# Patient Record
Sex: Male | Born: 1950 | Race: Black or African American | Hispanic: No | Marital: Married | Smoking: Never smoker
Health system: Southern US, Community
[De-identification: ages and names within clinical notes are randomized; demographics above are authoritative.]

## PROBLEM LIST (undated history)

## (undated) ENCOUNTER — Ambulatory Visit: Admission: EM | Payer: Medicare HMO | Source: Home / Self Care

## (undated) DIAGNOSIS — R32 Unspecified urinary incontinence: Secondary | ICD-10-CM

## (undated) DIAGNOSIS — T7840XA Allergy, unspecified, initial encounter: Secondary | ICD-10-CM

## (undated) DIAGNOSIS — B269 Mumps without complication: Secondary | ICD-10-CM

## (undated) DIAGNOSIS — Z87442 Personal history of urinary calculi: Secondary | ICD-10-CM

## (undated) DIAGNOSIS — E559 Vitamin D deficiency, unspecified: Secondary | ICD-10-CM

## (undated) DIAGNOSIS — C61 Malignant neoplasm of prostate: Secondary | ICD-10-CM

## (undated) DIAGNOSIS — R319 Hematuria, unspecified: Secondary | ICD-10-CM

## (undated) DIAGNOSIS — E7800 Pure hypercholesterolemia, unspecified: Secondary | ICD-10-CM

## (undated) DIAGNOSIS — R7309 Other abnormal glucose: Secondary | ICD-10-CM

## (undated) DIAGNOSIS — I10 Essential (primary) hypertension: Secondary | ICD-10-CM

## (undated) DIAGNOSIS — R9431 Abnormal electrocardiogram [ECG] [EKG]: Secondary | ICD-10-CM

## (undated) DIAGNOSIS — B059 Measles without complication: Secondary | ICD-10-CM

## (undated) DIAGNOSIS — Z8546 Personal history of malignant neoplasm of prostate: Secondary | ICD-10-CM

## (undated) DIAGNOSIS — B019 Varicella without complication: Secondary | ICD-10-CM

## (undated) DIAGNOSIS — E538 Deficiency of other specified B group vitamins: Secondary | ICD-10-CM

## (undated) DIAGNOSIS — G4733 Obstructive sleep apnea (adult) (pediatric): Secondary | ICD-10-CM

## (undated) DIAGNOSIS — N529 Male erectile dysfunction, unspecified: Secondary | ICD-10-CM

## (undated) DIAGNOSIS — J309 Allergic rhinitis, unspecified: Secondary | ICD-10-CM

## (undated) HISTORY — DX: Personal history of malignant neoplasm of prostate: Z85.46

## (undated) HISTORY — DX: Abnormal electrocardiogram (ECG) (EKG): R94.31

## (undated) HISTORY — DX: Varicella without complication: B01.9

## (undated) HISTORY — DX: Vitamin D deficiency, unspecified: E55.9

## (undated) HISTORY — DX: Allergy, unspecified, initial encounter: T78.40XA

## (undated) HISTORY — DX: Other abnormal glucose: R73.09

## (undated) HISTORY — DX: Unspecified urinary incontinence: R32

## (undated) HISTORY — DX: Obstructive sleep apnea (adult) (pediatric): G47.33

## (undated) HISTORY — DX: Measles without complication: B05.9

## (undated) HISTORY — DX: Allergic rhinitis, unspecified: J30.9

## (undated) HISTORY — DX: Essential (primary) hypertension: I10

## (undated) HISTORY — DX: Hematuria, unspecified: R31.9

## (undated) HISTORY — DX: Deficiency of other specified B group vitamins: E53.8

## (undated) HISTORY — DX: Male erectile dysfunction, unspecified: N52.9

## (undated) HISTORY — PX: ELBOW SURGERY: SHX618

## (undated) HISTORY — DX: Mumps without complication: B26.9

## (undated) HISTORY — DX: Personal history of urinary calculi: Z87.442

## (undated) HISTORY — DX: Pure hypercholesterolemia, unspecified: E78.00

---

## 2003-08-25 DIAGNOSIS — C61 Malignant neoplasm of prostate: Secondary | ICD-10-CM

## 2003-08-25 HISTORY — PX: PROSTATECTOMY: SHX69

## 2003-08-25 HISTORY — DX: Malignant neoplasm of prostate: C61

## 2004-03-01 ENCOUNTER — Other Ambulatory Visit: Payer: Self-pay

## 2006-08-24 LAB — HM COLONOSCOPY: HM Colonoscopy: NORMAL

## 2009-07-24 HISTORY — PX: OTHER SURGICAL HISTORY: SHX169

## 2009-12-09 ENCOUNTER — Emergency Department: Payer: Self-pay | Admitting: Emergency Medicine

## 2010-07-23 ENCOUNTER — Ambulatory Visit: Payer: Self-pay | Admitting: Family Medicine

## 2012-07-13 ENCOUNTER — Ambulatory Visit: Payer: Self-pay | Admitting: Family Medicine

## 2012-07-13 ENCOUNTER — Other Ambulatory Visit: Payer: Self-pay | Admitting: Family Medicine

## 2012-07-13 NOTE — Telephone Encounter (Signed)
No paper chart °

## 2012-07-14 ENCOUNTER — Encounter: Payer: Self-pay | Admitting: *Deleted

## 2012-07-18 ENCOUNTER — Other Ambulatory Visit: Payer: Self-pay | Admitting: Family Medicine

## 2012-07-18 NOTE — Telephone Encounter (Signed)
Please pull paper chart.  

## 2012-07-19 NOTE — Telephone Encounter (Signed)
No paper chart °

## 2012-07-26 DIAGNOSIS — N393 Stress incontinence (female) (male): Secondary | ICD-10-CM | POA: Insufficient documentation

## 2012-08-02 ENCOUNTER — Ambulatory Visit (INDEPENDENT_AMBULATORY_CARE_PROVIDER_SITE_OTHER): Payer: 59 | Admitting: Family Medicine

## 2012-08-02 ENCOUNTER — Encounter: Payer: Self-pay | Admitting: Family Medicine

## 2012-08-02 VITALS — BP 162/88 | HR 71 | Temp 98.3°F | Resp 16 | Ht 70.0 in | Wt 196.0 lb

## 2012-08-02 DIAGNOSIS — I10 Essential (primary) hypertension: Secondary | ICD-10-CM | POA: Insufficient documentation

## 2012-08-02 DIAGNOSIS — Z0000 Encounter for general adult medical examination without abnormal findings: Secondary | ICD-10-CM

## 2012-08-02 LAB — HEMOGLOBIN A1C: Mean Plasma Glucose: 120 mg/dL — ABNORMAL HIGH (ref <117)

## 2012-08-02 LAB — COMPREHENSIVE METABOLIC PANEL
Alkaline Phosphatase: 72 U/L (ref 39–117)
BUN: 14 mg/dL (ref 6–23)
Glucose, Bld: 92 mg/dL (ref 70–99)
Total Bilirubin: 0.6 mg/dL (ref 0.3–1.2)

## 2012-08-02 LAB — CBC WITH DIFFERENTIAL/PLATELET
Basophils Relative: 1 % (ref 0–1)
Eosinophils Absolute: 0 10*3/uL (ref 0.0–0.7)
HCT: 40.5 % (ref 39.0–52.0)
Hemoglobin: 14.3 g/dL (ref 13.0–17.0)
MCH: 29.7 pg (ref 26.0–34.0)
MCHC: 35.3 g/dL (ref 30.0–36.0)
Monocytes Absolute: 0.3 10*3/uL (ref 0.1–1.0)
Monocytes Relative: 5 % (ref 3–12)
Neutrophils Relative %: 78 % — ABNORMAL HIGH (ref 43–77)

## 2012-08-02 LAB — LIPID PANEL
Cholesterol: 117 mg/dL (ref 0–200)
HDL: 40 mg/dL (ref >39)
Total CHOL/HDL Ratio: 2.9 Ratio
Triglycerides: 45 mg/dL (ref <150)

## 2012-08-02 LAB — POCT UA - MICROSCOPIC ONLY
Bacteria, U Microscopic: NEGATIVE
Crystals, Ur, HPF, POC: NEGATIVE

## 2012-08-02 LAB — POCT URINALYSIS DIPSTICK
Bilirubin, UA: NEGATIVE
Glucose, UA: NEGATIVE
Leukocytes, UA: NEGATIVE
Nitrite, UA: NEGATIVE
Urobilinogen, UA: 0.2
pH, UA: 6

## 2012-08-02 MED ORDER — CLONIDINE HCL 0.1 MG PO TABS: 0.1000 mg | ORAL_TABLET | Freq: Two times a day (BID) | ORAL | Status: DC

## 2012-08-02 MED ORDER — DILTIAZEM HCL ER COATED BEADS 180 MG PO CP24: 180.0000 mg | ORAL_CAPSULE | Freq: Two times a day (BID) | ORAL | Status: DC

## 2012-08-02 MED ORDER — ENALAPRIL MALEATE 20 MG PO TABS: 20.0000 mg | ORAL_TABLET | Freq: Two times a day (BID) | ORAL | Status: DC

## 2012-08-02 NOTE — Assessment & Plan Note (Signed)
Anticipatory guidance --- weight maintenance, exercise.  Colonoscopy UTD; immunizations UTD.  Obtain labs.

## 2012-08-02 NOTE — Patient Instructions (Addendum)
1. Routine general medical examination at a health care facility  POCT urinalysis dipstick, CBC with Differential, Comprehensive metabolic panel, Hemoglobin A1c, Lipid panel, TSH, Vitamin B12, Vitamin D 25 hydroxy, EKG 12-Lead, POCT UA - Microscopic Only, PSA     INCREASE CLONIDINE 0.1MG  TO ONE PILL TWICE DAILY.

## 2012-08-02 NOTE — Assessment & Plan Note (Signed)
Uncontrolled/worsening; increase Clonidine to 0.1mg  bid.  Obtain labs.

## 2012-08-02 NOTE — Progress Notes (Signed)
[address]   [place], [place]  [address]   [phone]  Subjective:    Patient ID: Juan Chavez, male    DOB: [birth year], 61 y.o.   MRN: [id number]  HPIThis 61 y.o. male presents to establish care and for CPE.  Last physical 04/28/11.    Colonoscopy 2008.  Normal.  [facility]. TDAP 04/28/2011 Pneumovax 2000. Zostavax never; shingles torso 1997. Influenza vaccine 04/2012 employer.  2.  HTN: elevated in past several months; home readings 130-160/70-90.  Major stressors with health, work, wife's health.     Review of Systems  Constitutional: Negative for fever, chills, diaphoresis, activity change, appetite change, fatigue and unexpected weight change.  HENT: Negative for hearing loss, ear pain, nosebleeds, congestion, sore throat, facial swelling, rhinorrhea, sneezing, drooling, mouth sores, trouble swallowing, neck pain, neck stiffness, dental problem, voice change, postnasal drip, sinus pressure, tinnitus and ear discharge.   Eyes: Negative for photophobia, pain, discharge, redness, itching and visual disturbance.  Respiratory: Negative for apnea, cough, choking, chest tightness, shortness of breath, wheezing and stridor.   Cardiovascular: Negative for chest pain, palpitations and leg swelling.  Gastrointestinal: Negative for nausea, vomiting, abdominal pain, diarrhea, constipation, blood in stool, abdominal distention, anal bleeding and rectal pain.  Genitourinary: Negative for dysuria, urgency, hematuria, flank pain, decreased urine volume, discharge, penile swelling, scrotal swelling, enuresis, difficulty urinating, genital sores, penile pain and testicular pain.       Worsening urinary incontinence.  Wears 1-3 Depends daily.  Musculoskeletal: Negative for myalgias, back pain, joint swelling, arthralgias and gait problem.  Skin: Negative for color change, pallor, rash and wound.  Neurological: Negative for dizziness, tremors, seizures, syncope, facial asymmetry, speech difficulty, weakness,  light-headedness, numbness and headaches.  Hematological: Negative for adenopathy. Does not bruise/bleed easily.  Psychiatric/Behavioral: Negative for suicidal ideas, hallucinations, behavioral problems, confusion, sleep disturbance, self-injury, dysphoric mood, decreased concentration and agitation. The patient is not nervous/anxious and is not hyperactive.         Past Medical History  Diagnosis Date  . Personal history of urinary calculi   . Hematuria, unspecified   . Other abnormal glucose   . Unspecified vitamin D deficiency   . Other B-complex deficiencies   . Personal history of malignant neoplasm of prostate   . Pure hypercholesterolemia   . Allergic rhinitis, cause unspecified   . Essential hypertension, benign   . Nonspecific abnormal electrocardiogram (ECG) (EKG)   . Impotence of organic origin   . Obstructive sleep apnea (adult) (pediatric)     CPAP  . Unspecified urinary incontinence   . Chicken pox     childhood  . Measles     childhood  . Mumps     childhood    Past Surgical History  Procedure Date  . Elbow surgery   . Prostatectomy 2005  . Urological surgery 07/2009    Prior to Admission medications   Medication Sig Start Date End Date Taking? Authorizing Provider  aspirin 81 MG tablet Take 81 mg by mouth daily.   Yes Historical Provider, MD  cholecalciferol (VITAMIN D) 1000 UNITS tablet Take 1,000 Units by mouth 2 (two) times daily.   Yes Historical Provider, MD  cloNIDine (CATAPRES) 0.1 MG tablet Take 0.1 mg by mouth at bedtime.   Yes Historical Provider, MD  Cyanocobalamin (VITAMIN B-12 PO) Take by mouth daily.   Yes Historical Provider, MD  diltiazem (CARDIZEM CD) 180 MG 24 hr capsule Take 180 mg by mouth 2 (two) times daily.   Yes Historical Provider,  MD  enalapril (VASOTEC) 20 MG tablet Take 20 mg by mouth 2 (two) times daily.   Yes Historical Provider, MD  solifenacin (VESICARE) 5 MG tablet Take 10 mg by mouth at bedtime.   Yes Historical Provider,  MD    No Known Allergies  History   Social History  . Marital Status: Married    Spouse Name: N/A    Number of Children: 2  . 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 Smoking status: Never Smoker   . Smokeless tobacco: Not on file  . Alcohol Use: Yes     Comment: occasional drinks beer 2 per week  . Drug Use: No  . Sexually Active: Not on file   Other Topics Concern  . Not on file   Social History Narrative   Always uses seat belts. Smoke alarm and carbon monoxide detector in the home.Caffeine use: none. Married x 41 years happily married. Guns in the home stored in locked cabinet. Exercise: moderate, walking daily.    Family History  Problem Relation Age of Onset  . Heart disease Father   . Stroke Father   . Hypertension Father   . Heart disease Sister   . Dementia Sister   . Hypertension Mother   . Heart disease Mother   . Heart disease Brother     Objective:   Physical Exam  Nursing note and vitals reviewed. Constitutional: He is oriented to person, place, and time. He appears well-developed and well-nourished. No distress.  HENT:  Head: Normocephalic and atraumatic.  Right Ear: External ear normal.  Left Ear: External ear normal.  Nose: Nose normal.  Mouth/Throat: Oropharynx is clear and moist.  Eyes: Conjunctivae normal and EOM are normal. Pupils are equal, round, and reactive to light.  Neck: Normal range of motion. Neck supple. No JVD present. No tracheal deviation present. No thyromegaly present.  Cardiovascular: Normal rate, regular rhythm, normal heart sounds and intact distal pulses.  Exam reveals no gallop and no friction rub.   No murmur heard. Pulmonary/Chest: Effort normal and breath sounds normal. No respiratory distress. He has no wheezes. He has no rales.  Abdominal: Soft. Bowel sounds are normal. He exhibits no distension and no mass. There is no tenderness. There is  no rebound and no guarding.  Musculoskeletal:       Right shoulder: He exhibits normal range of motion, no tenderness and no bony tenderness.       Left shoulder: He exhibits normal range of motion, no tenderness and no bony tenderness.       Cervical back: He exhibits normal range of motion, no tenderness and no bony tenderness.       Lumbar back: He exhibits normal range of motion, no tenderness and no bony tenderness.  Lymphadenopathy:    He has no cervical adenopathy.  Neurological: He is alert and oriented to person, place, and time. He has normal reflexes. No cranial nerve deficit. He exhibits normal muscle tone. Coordination normal.  Skin: Skin is warm and dry. No rash noted. He is not diaphoretic. No erythema. No pallor.  Psychiatric: He has a normal mood and affect. His behavior is normal. Judgment and thought content normal.    EKG:  NSR; T wave inversion anterolateral leads.      Assessment & Plan:   1. Routine general medical examination at a health care facility  POCT urinalysis dipstick, CBC with Differential,  Comprehensive metabolic panel, Hemoglobin A1c, Lipid panel, TSH, Vitamin B12, Vitamin D 25 hydroxy, EKG 12-Lead, POCT UA - Microscopic Only, PSA   2. Hypertension

## 2012-08-02 NOTE — Progress Notes (Deleted)
  Subjective:    Patient ID: Juan Chavez, male    DOB: 1951/04/04, 61 y.o.   MRN: [id number]  HPI    Review of Systems     Objective:   Physical Exam        Assessment & Plan:

## 2012-08-06 ENCOUNTER — Encounter: Payer: Self-pay | Admitting: Radiology

## 2012-08-15 ENCOUNTER — Encounter: Payer: Self-pay | Admitting: Family Medicine

## 2012-10-04 ENCOUNTER — Encounter: Payer: Self-pay | Admitting: Family Medicine

## 2012-10-31 ENCOUNTER — Ambulatory Visit: Payer: 59 | Admitting: Family Medicine

## 2012-11-14 ENCOUNTER — Ambulatory Visit (INDEPENDENT_AMBULATORY_CARE_PROVIDER_SITE_OTHER): Payer: 59 | Admitting: Family Medicine

## 2012-11-14 ENCOUNTER — Encounter: Payer: Self-pay | Admitting: Family Medicine

## 2012-11-14 VITALS — BP 158/98 | HR 59 | Temp 98.1°F | Resp 16 | Ht 70.0 in | Wt 210.4 lb

## 2012-11-14 DIAGNOSIS — J309 Allergic rhinitis, unspecified: Secondary | ICD-10-CM

## 2012-11-14 DIAGNOSIS — I10 Essential (primary) hypertension: Secondary | ICD-10-CM

## 2012-11-14 DIAGNOSIS — E559 Vitamin D deficiency, unspecified: Secondary | ICD-10-CM

## 2012-11-14 DIAGNOSIS — R7309 Other abnormal glucose: Secondary | ICD-10-CM | POA: Insufficient documentation

## 2012-11-14 LAB — CBC WITH DIFFERENTIAL/PLATELET
Basophils Absolute: 0 10*3/uL (ref 0.0–0.1)
Basophils Relative: 1 % (ref 0–1)
Eosinophils Absolute: 0.1 10*3/uL (ref 0.0–0.7)
Eosinophils Relative: 2 % (ref 0–5)
HCT: 38.8 % — ABNORMAL LOW (ref 39.0–52.0)
MCHC: 35.8 g/dL (ref 30.0–36.0)
MCV: 81.5 fL (ref 78.0–100.0)
Monocytes Absolute: 0.3 10*3/uL (ref 0.1–1.0)
Platelets: 161 10*3/uL (ref 150–400)
RDW: 13.8 % (ref 11.5–15.5)

## 2012-11-14 LAB — COMPREHENSIVE METABOLIC PANEL
AST: 17 U/L (ref 0–37)
Alkaline Phosphatase: 80 U/L (ref 39–117)
BUN: 16 mg/dL (ref 6–23)
Calcium: 8.8 mg/dL (ref 8.4–10.5)
Creat: 0.81 mg/dL (ref 0.50–1.35)
Total Bilirubin: 0.5 mg/dL (ref 0.3–1.2)

## 2012-11-14 LAB — HEMOGLOBIN A1C: Mean Plasma Glucose: 123 mg/dL — ABNORMAL HIGH (ref <117)

## 2012-11-14 MED ORDER — FLUTICASONE PROPIONATE 50 MCG/ACT NA SUSP: 2.0000 | Freq: Every day | NASAL | Status: DC

## 2012-11-14 MED ORDER — CLONIDINE HCL 0.1 MG PO TABS: 0.1000 mg | ORAL_TABLET | Freq: Three times a day (TID) | ORAL | Status: DC

## 2012-11-14 NOTE — Patient Instructions (Addendum)
Essential hypertension, benign - Plan: CBC with Differential  Other abnormal glucose - Plan: Comprehensive metabolic panel, Hemoglobin A1c  Unspecified vitamin D deficiency - Plan: Vitamin D 25 hydroxy  Allergic rhinitis

## 2012-11-14 NOTE — Assessment & Plan Note (Signed)
Worsening/uncontrolled; rx for Flonase provided and advised to use daily for next three months.

## 2012-11-14 NOTE — Assessment & Plan Note (Signed)
Stable with dietary modification; obtain labs.

## 2012-11-14 NOTE — Assessment & Plan Note (Signed)
Improved but still elevated; increase Clonidine 0.1mg  to tid; obtain labs.  Check blood pressure daily; to call in one month if remains elevated or if suffering with side effects to Clonidine; also advised to monitor pulse closely; should remain>60.

## 2012-11-14 NOTE — Assessment & Plan Note (Signed)
Uncontrolled; repeat labs today; continue supplementation.

## 2012-11-14 NOTE — Progress Notes (Signed)
[address]   [place], [place]  [address]   [phone]  Subjective:    Patient ID: Juan Chavez, male    DOB: Jan 13, 1951, 62 y.o.   MRN: [id number]  HPI This 62 y.o. male presents for three month follow-up for the following:  1. HTN:  Three month follow-up; management at last visit includes increasing Clonidine to 0.1mg  po bid.  Noticed increase in urination with increased dose.  Home readings running 135-140/84-85.   Good compliance with medication; good tolerance to medication; good symptom control.  2. Glucose Intolerance:  Stable; continues to monitor dietary intake; exercises regularly yet working 80-100 hours per week currently at work.  3.  Vitamin D deficiency: uncontrolled at last visit; continues with daily vitamin D supplementation.    4.  Head congestion: onset one week ago.  No fever/chill/sweats.  No headache.  ST mild; no ear pain; +rhinorrhea one sided; +nasal congestion; no coughing.  +sneezing.  Previous use of Flonase.    5.  Urinary incontinence: followed once yearly; no longer taking Vesicare; switched to new medication but insurance reluctant.  Taking Gala Murdoch currently.  Review of Systems  Constitutional: Negative for fever, chills, diaphoresis and fatigue.  HENT: Positive for congestion, sore throat, rhinorrhea, sneezing and postnasal drip. Negative for ear pain, trouble swallowing and voice change.   Respiratory: Negative for cough, shortness of breath, wheezing and stridor.   Cardiovascular: Negative for chest pain, palpitations and leg swelling.  Gastrointestinal: Negative for nausea, vomiting, abdominal pain, diarrhea and constipation.  Neurological: Negative for dizziness, tremors, seizures, syncope, facial asymmetry, speech difficulty, weakness, light-headedness, numbness and headaches.        Past Medical History  Diagnosis Date  . Personal history of urinary calculi   . Hematuria, unspecified   . Other abnormal glucose   . Unspecified vitamin D  deficiency   . Other B-complex deficiencies   . Personal history of malignant neoplasm of prostate   . Pure hypercholesterolemia   . Allergic rhinitis, cause unspecified   . Essential hypertension, benign   . Nonspecific abnormal electrocardiogram (ECG) (EKG)   . Impotence of organic origin   . Obstructive sleep apnea (adult) (pediatric)     CPAP  . Unspecified urinary incontinence   . Chicken pox     childhood  . Measles     childhood  . Mumps     childhood    Past Surgical History  Procedure Laterality Date  . Elbow surgery    . Prostatectomy  2005  . Urological surgery  07/2009  . Colonoscopy  08/24/2006    normal.  [facility].  Repeat 10 years.    Prior to Admission medications   Medication Sig Start Date End Date Taking? Authorizing Provider  aspirin 81 MG tablet Take 81 mg by mouth daily.   Yes Historical Provider, MD  cholecalciferol (VITAMIN D) 1000 UNITS tablet Take 1,000 Units by mouth 2 (two) times daily.   Yes Historical Provider, MD  cloNIDine (CATAPRES) 0.1 MG tablet Take 1 tablet (0.1 mg total) by mouth 2 (two) times daily. 08/02/12  Yes Ethelda Chick, MD  Cyanocobalamin (VITAMIN B-12 PO) Take by mouth daily.   Yes Historical Provider, MD  diltiazem (CARDIZEM CD) 180 MG 24 hr capsule Take 1 capsule (180 mg total) by mouth 2 (two) times daily. 08/02/12  Yes Ethelda Chick, MD  enalapril (VASOTEC) 20 MG tablet Take 1 tablet (20 mg total) by mouth 2 (two) times daily. 08/02/12  Yes Myrle Sheng  Katrinka Blazing, MD  MYRBETRIQ 50 MG TB24 Take 1 tablet by mouth daily. For urinary incontinence 07/26/12  Yes Historical Provider, MD  ELMIRON 100 MG capsule Take 1 capsule by mouth daily. 06/22/12   Historical Provider, MD  solifenacin (VESICARE) 5 MG tablet Take 10 mg by mouth at bedtime.    Historical Provider, MD    No Known Allergies  History   Social History  . Marital Status: Married    Spouse Name: N/A    Number of Children: 2  . Years of Education: N/A   Occupational History    . [employer]/textile technologist    Social History Main Topics  . Smoking status: Never Smoker   . Smokeless tobacco: Not on file  . Alcohol Use: Yes     Comment: occasional drinks beer 2 per week  . Drug Use: No  . Sexually Active: Not on file   Other Topics Concern  . Not on file   Social History Narrative   Always uses seat belts. Smoke alarm and carbon monoxide detector in the home.Caffeine use: none. Married x 41 years happily married. Guns in the home stored in locked cabinet. Exercise: moderate, walking daily.    Family History  Problem Relation Age of Onset  . Heart disease Father   . Stroke Father   . Hypertension Father   . Heart disease Sister   . Dementia Sister   . Hypertension Mother   . Heart disease Mother   . Arthritis Mother   . Heart disease Brother     Objective:   Physical Exam  Nursing note and vitals reviewed. Constitutional: He is oriented to person, place, and time. He appears well-developed and well-nourished. No distress.  HENT:  Mouth/Throat: Oropharynx is clear and moist.  Eyes: Conjunctivae are normal. Pupils are equal, round, and reactive to light.  Neck: Normal range of motion. Neck supple. No JVD present. No thyromegaly present.  Cardiovascular: Normal rate, regular rhythm, normal heart sounds and intact distal pulses.  Exam reveals no gallop and no friction rub.   No murmur heard. Pulmonary/Chest: Effort normal and breath sounds normal. He has no wheezes. He has no rales.  Abdominal: Soft. Bowel sounds are normal. There is no tenderness. There is no rebound and no guarding.  Lymphadenopathy:    He has no cervical adenopathy.  Neurological: He is alert and oriented to person, place, and time. No cranial nerve deficit. He exhibits normal muscle tone. Coordination normal.  Skin: He is not diaphoretic.  Psychiatric: He has a normal mood and affect. His behavior is normal. Judgment and thought content normal.        Assessment & Plan:   Essential hypertension, benign - Plan: CBC with Differential  Other abnormal glucose - Plan: Comprehensive metabolic panel, Hemoglobin A1c  Unspecified vitamin D deficiency - Plan: Vitamin D 25 hydroxy  Allergic rhinitis    Meds ordered this encounter  Medications  . cloNIDine (CATAPRES) 0.1 MG tablet    Sig: Take 1 tablet (0.1 mg total) by mouth 3 (three) times daily.    Dispense:  90 tablet    Refill:  11  . fluticasone (FLONASE) 50 MCG/ACT nasal spray    Sig: Place 2 sprays into the nose daily.    Dispense:  16 g    Refill:  6

## 2012-11-17 ENCOUNTER — Telehealth: Payer: Self-pay | Admitting: Radiology
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
## 2012-12-30 ENCOUNTER — Telehealth: Payer: Self-pay

## 2012-12-30 DIAGNOSIS — N3040 Irradiation cystitis without hematuria: Secondary | ICD-10-CM | POA: Insufficient documentation

## 2012-12-30 DIAGNOSIS — Z8546 Personal history of malignant neoplasm of prostate: Secondary | ICD-10-CM | POA: Insufficient documentation

## 2012-12-30 NOTE — Telephone Encounter (Signed)
Spoke with wife; pt went for pre-operative visit today.  To undergo a colonoscopy.   Blood pressure high at pre-op visit; recommended follow-up.  EKG had a glitch in it.  170/84 BP.  Home BP running 140s.  Has not checked today at home.  Andris Baumann, NP.  Colonoscopy scheduled for 01/20/13.

## 2012-12-30 NOTE — Telephone Encounter (Signed)
Patients wife Juan Chavez is calling in behalf of her husband Juan Chavez wanting to speak to Dr.Smith because she had some questions about a EKG that was done today in another office.   [phone]

## 2013-01-01 NOTE — Telephone Encounter (Signed)
Overbook appt made for 5/13.

## 2013-01-03 ENCOUNTER — Encounter: Payer: Self-pay | Admitting: Family Medicine

## 2013-01-03 ENCOUNTER — Ambulatory Visit (INDEPENDENT_AMBULATORY_CARE_PROVIDER_SITE_OTHER): Payer: 59 | Admitting: Family Medicine

## 2013-01-03 VITALS — BP 150/82 | HR 67 | Temp 98.1°F | Resp 18 | Ht 69.0 in | Wt 206.0 lb

## 2013-01-03 DIAGNOSIS — R9431 Abnormal electrocardiogram [ECG] [EKG]: Secondary | ICD-10-CM

## 2013-01-03 DIAGNOSIS — I10 Essential (primary) hypertension: Secondary | ICD-10-CM

## 2013-01-03 NOTE — Progress Notes (Signed)
   [address]   [place], [place]  [address]   [phone]  Subjective:    Patient ID: Juan Chavez, male    DOB: July 02, 1951, 62 y.o.   MRN: [id number]  HPI This 62 y.o. male presents for evaluation of the following:  1.  HTN:  Home blood pressures running 140s; ranges 120s-148s/70-80s.  Asymptomatic; walking daily.  Blood pressure 170/84; repeat was 158/80.  Did not check this morning. Feels well. Denies CP/palp/SOB/leg swelling/diaphoresis; denies HA/dizziness/focal weakness/paresthesias.  Increased Clonidine 0.1mg  to tid yesterday.  No side effects to medication.  S/p pre-op evaluation for colonoscopy; anesthesiologist recommended evaluation due to glitch on EKG. History of abnormal EKG; referred to cardiology at [facility] in 2011 with negative stress testing.   Review of Systems  Constitutional: Negative for fever, chills, diaphoresis, activity change, appetite change and fatigue.  Respiratory: Negative for shortness of breath, wheezing and stridor.   Cardiovascular: Negative for chest pain, palpitations and leg swelling.  Neurological: Negative for dizziness, tremors, seizures, syncope, facial asymmetry, speech difficulty, weakness, light-headedness, numbness and headaches.       Objective:   Physical Exam  Nursing note and vitals reviewed. Constitutional: He is oriented to person, place, and time. He appears well-developed and well-nourished. No distress.  HENT:  Head: Normocephalic and atraumatic.  Eyes: Conjunctivae and EOM are normal. Pupils are equal, round, and reactive to light.  Neck: Normal range of motion. Neck supple. No JVD present. No thyromegaly present.  Cardiovascular: Normal rate, regular rhythm, normal heart sounds and intact distal pulses.  Exam reveals no gallop and no friction rub.   No murmur heard. Pulmonary/Chest: Effort normal and breath sounds normal.  Abdominal: Soft. Bowel sounds are normal. There is no tenderness. There is no rebound.  Lymphadenopathy:   He has no cervical adenopathy.  Neurological: He is alert and oriented to person, place, and time. No cranial nerve deficit. He exhibits normal muscle tone. Coordination normal.  Skin: He is not diaphoretic.  Psychiatric: He has a normal mood and affect. His behavior is normal.   EKG: NSR: ST changes throughout; unchanged from last EKG.    Assessment & Plan:  Essential hypertension, benign - Plan: EKG 12-Lead  Nonspecific abnormal electrocardiogram (ECG) (EKG) - Plan: EKG 12-Lead  1. HTN: moderately controlled; increased Clonidine to 0.1mg  tid yesterday; continue current dose of medication; follow-up six weeks. 2.  Abnormal EKG: chronic issue for patient; no changes in EKG since last visit.  Asymptomatic; s/p extensive cardiology evaluation in 2011 with abnormal EKG.  Negative work up at that time.  Clearance for colonoscopy.

## 2013-01-22 HISTORY — PX: COLONOSCOPY: SHX174

## 2013-02-20 ENCOUNTER — Encounter: Payer: Self-pay | Admitting: Family Medicine

## 2013-02-20 ENCOUNTER — Ambulatory Visit (INDEPENDENT_AMBULATORY_CARE_PROVIDER_SITE_OTHER): Payer: 59 | Admitting: Family Medicine

## 2013-02-20 VITALS — BP 144/84 | HR 57 | Temp 98.0°F | Resp 16 | Ht 70.0 in | Wt 198.8 lb

## 2013-02-20 DIAGNOSIS — I10 Essential (primary) hypertension: Secondary | ICD-10-CM

## 2013-02-20 DIAGNOSIS — F430 Acute stress reaction: Secondary | ICD-10-CM

## 2013-02-20 LAB — BASIC METABOLIC PANEL
BUN: 17 mg/dL (ref 6–23)
Calcium: 9.1 mg/dL (ref 8.4–10.5)
Glucose, Bld: 110 mg/dL — ABNORMAL HIGH (ref 70–99)
Potassium: 3.6 mEq/L (ref 3.5–5.3)

## 2013-02-20 LAB — LIPID PANEL
Cholesterol: 143 mg/dL (ref 0–200)
HDL: 47 mg/dL (ref >39)
Total CHOL/HDL Ratio: 3 Ratio
VLDL: 10 mg/dL (ref 0–40)
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
## 2013-03-01 ENCOUNTER — Telehealth: Payer: Self-pay

## 2013-03-01 NOTE — Telephone Encounter (Signed)
Patient dropped off papers for his insurance company and would like a copy of them.   [phone]

## 2013-03-06 NOTE — Telephone Encounter (Signed)
i looked through all stacks of records to be sent to the scan center. I could not find the documents in the stacks. Document has most likely been sent to the scan center.  bf

## 2013-05-09 ENCOUNTER — Other Ambulatory Visit: Payer: Self-pay

## 2013-05-09 MED ORDER — ENALAPRIL MALEATE 20 MG PO TABS: 20.0000 mg | ORAL_TABLET | Freq: Two times a day (BID) | ORAL | Status: DC

## 2013-05-14 ENCOUNTER — Other Ambulatory Visit: Payer: Self-pay | Admitting: Family Medicine

## 2013-05-29 ENCOUNTER — Encounter: Payer: Self-pay | Admitting: Family Medicine

## 2013-05-29 ENCOUNTER — Ambulatory Visit (INDEPENDENT_AMBULATORY_CARE_PROVIDER_SITE_OTHER): Payer: 59 | Admitting: Family Medicine

## 2013-05-29 VITALS — BP 160/80 | HR 62 | Temp 97.8°F | Resp 16 | Ht 70.0 in | Wt 203.0 lb

## 2013-05-29 DIAGNOSIS — Z23 Encounter for immunization: Secondary | ICD-10-CM

## 2013-05-29 DIAGNOSIS — R7309 Other abnormal glucose: Secondary | ICD-10-CM

## 2013-05-29 DIAGNOSIS — H6123 Impacted cerumen, bilateral: Secondary | ICD-10-CM

## 2013-05-29 DIAGNOSIS — I10 Essential (primary) hypertension: Secondary | ICD-10-CM

## 2013-05-29 LAB — CBC WITH DIFFERENTIAL/PLATELET
Basophils Absolute: 0 10*3/uL (ref 0.0–0.1)
Lymphocytes Relative: 21 % (ref 12–46)
Lymphs Abs: 1.3 10*3/uL (ref 0.7–4.0)
MCV: 85.6 fL (ref 78.0–100.0)
Neutro Abs: 4.3 10*3/uL (ref 1.7–7.7)
Neutrophils Relative %: 70 % (ref 43–77)
Platelets: 174 10*3/uL (ref 150–400)
RBC: 4.39 MIL/uL (ref 4.22–5.81)
RDW: 13.1 % (ref 11.5–15.5)
WBC: 6.1 10*3/uL (ref 4.0–10.5)

## 2013-05-29 LAB — COMPREHENSIVE METABOLIC PANEL
ALT: 17 U/L (ref 0–53)
AST: 15 U/L (ref 0–37)
CO2: 29 mEq/L (ref 19–32)
Calcium: 9 mg/dL (ref 8.4–10.5)
Chloride: 105 mEq/L (ref 96–112)
Creat: 0.89 mg/dL (ref 0.50–1.35)
Potassium: 3.6 mEq/L (ref 3.5–5.3)
Sodium: 140 mEq/L (ref 135–145)
Total Protein: 6.8 g/dL (ref 6.0–8.3)

## 2013-05-29 LAB — HEMOGLOBIN A1C: Hgb A1c MFr Bld: 6.1 % — ABNORMAL HIGH (ref <5.7)

## 2013-05-29 NOTE — Progress Notes (Signed)
[address]   [place], [place]  [address]   [phone]  Subjective:    Patient ID: Juan Chavez, male    DOB: 1951-02-14, 62 y.o.   MRN: [id number]  HPI This 62 y.o. male presents for four month follow-up for the following:  1. HTN:   I'm "fair." BP at home 115-118/69   2. Allergies have been decent.  L ear with congestion.  3.  Stress:   getting better. Everything going ok. Exercising every day, running 1/4 mile and walking. Watching sugar intake.   4.  Saw urologist in ER last week for urinary obstruction. Catheter placed. Passed clots. No antibiotics prescribed. No further problems.  No CP, no SOB, no cough, no swelling, occasional sinus headache on left side. No diarrhea, no constipation.  Work going ok. Traveling [place].  Wife had back surgery at [facility]. Still really sore.  5.  Flu shot administered today.  Review of Systems  Constitutional: Negative for fever, chills, diaphoresis and fatigue.  HENT: Positive for congestion. Negative for hearing loss, ear pain, sore throat, rhinorrhea, sneezing, postnasal drip, sinus pressure and ear discharge.   Eyes: Negative for photophobia and visual disturbance.  Respiratory: Negative for cough, shortness of breath and stridor.   Cardiovascular: Negative for chest pain, palpitations and leg swelling.  Gastrointestinal: Negative for nausea, vomiting, abdominal pain and diarrhea.  Skin: Negative for rash.  Neurological: Negative for dizziness, tremors, seizures, syncope, facial asymmetry, speech difficulty, weakness, light-headedness, numbness and headaches.  Psychiatric/Behavioral: Negative for sleep disturbance, dysphoric mood and decreased concentration. The patient is not nervous/anxious.    Past Medical History  Diagnosis Date  . Personal history of urinary calculi   . Hematuria, unspecified   . Other abnormal glucose   . Unspecified vitamin D deficiency   . Other B-complex deficiencies   .  Personal history of malignant neoplasm of prostate   . Pure hypercholesterolemia   . Allergic rhinitis, cause unspecified   . Essential hypertension, benign   . Nonspecific abnormal electrocardiogram (ECG) (EKG)   . Impotence of organic origin   . Obstructive sleep apnea (adult) (pediatric)     CPAP  . Unspecified urinary incontinence   . Chicken pox     childhood  . Measles     childhood  . Mumps     childhood   Past Surgical History  Procedure Laterality Date  . Elbow surgery    . Prostatectomy  2005  . Urological surgery  07/2009  . Colonoscopy  01/22/2013    normal.  [facility].  Repeat 5 years.   No Known Allergies Current Outpatient Prescriptions on File Prior to Visit  Medication Sig Dispense Refill  . aspirin 81 MG tablet Take 81 mg by mouth daily.      . cholecalciferol (VITAMIN D) 1000 UNITS tablet Take 1,000 Units by mouth 2 (two) times daily.      . Cyanocobalamin (VITAMIN B-12 PO) Take by mouth daily.      Marland Kitchen diltiazem (CARDIZEM CD) 180 MG 24 hr capsule TAKE ONE CAPSULE BY MOUTH TWICE A DAY  180 capsule  3  . ELMIRON 100 MG capsule Take 1 capsule by mouth daily.      . enalapril (VASOTEC) 20 MG tablet Take 1 tablet (20 mg total) by mouth 2 (two) times daily.  90 tablet  0  . fluticasone (FLONASE) 50 MCG/ACT nasal spray Place 2 sprays into the nose daily.  16 g  6  . MYRBETRIQ 50 MG TB24 Take  1 tablet by mouth daily. For urinary incontinence      . cloNIDine (CATAPRES) 0.1 MG tablet Take 1 tablet (0.1 mg total) by mouth 3 (three) times daily.  90 tablet  11  . solifenacin (VESICARE) 5 MG tablet Take 10 mg by mouth at bedtime.       No current facility-administered medications on file prior to visit.   History   Social History  . Marital Status: Married    Spouse Name: N/A    Number of Children: 2  . 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 Smoking status: Never Smoker   . Smokeless  tobacco: Not on file  . Alcohol Use: Yes     Comment: occasional drinks beer 2 per week  . Drug Use: No  . Sexual Activity: Yes    Partners: Female   Other Topics Concern  . Not on file   Social History Narrative   Always uses seat belts. Smoke alarm and carbon monoxide detector in the home.Caffeine use: none. Married x 41 years happily married. Guns in the home stored in locked cabinet. Exercise: moderate, walking daily.       Objective:   Physical Exam  Nursing note and vitals reviewed. Constitutional: He appears well-developed and well-nourished.  HENT:  Right Ear: External ear normal.  Left Ear: External ear normal.  Nose: Nose normal.  Mouth/Throat: Oropharynx is clear and moist.  +B cerumen impaction L>R obstructing TMs.  Eyes: Pupils are equal, round, and reactive to light.  Neck: Normal range of motion. Neck supple.  Cardiovascular: Normal rate, regular rhythm and normal heart sounds.  Exam reveals no gallop and no friction rub.   No murmur heard. Pulmonary/Chest: Effort normal and breath sounds normal. No respiratory distress. He has no wheezes. He has no rales.  Abdominal: Soft. Bowel sounds are normal. He exhibits no distension. There is no tenderness. There is no rebound.  Musculoskeletal: Normal range of motion. He exhibits no edema and no tenderness.  Neurological: He is alert.  Skin: Skin is warm and dry.  Psychiatric: He has a normal mood and affect. His behavior is normal. Judgment and thought content normal.   INFLUENZA VACCINE ADMINISTERED.  B EARS IRRIGATED BY MICHELLE;  TM TUBES EMBEDDED IN CERUMEN.      Assessment & Plan:  Other abnormal glucose - Plan: Hemoglobin A1c  Essential hypertension, benign - Plan: CBC with Differential, Comprehensive metabolic panel  Need for prophylactic vaccination and inoculation against influenza - Plan: Flu Vaccine QUAD 36+ mos IM  Cerumen impaction, bilateral   1.  HTN: controlled with white coat syndrome; obtain  labs.  No change in management. 2.  Glucose Intolerance: stable; obtain labs; continue with dietary modification and exercise. 3.  Cerumen Impaction B:  New. S/p irrigation with normal TMs bilaterally. 4. S/p flu vaccine.

## 2013-07-09 ENCOUNTER — Other Ambulatory Visit: Payer: Self-pay | Admitting: Family Medicine

## 2013-08-29 ENCOUNTER — Ambulatory Visit (INDEPENDENT_AMBULATORY_CARE_PROVIDER_SITE_OTHER): Payer: 59 | Admitting: Family Medicine

## 2013-08-29 ENCOUNTER — Encounter: Payer: Self-pay | Admitting: Family Medicine

## 2013-08-29 VITALS — BP 166/93 | HR 66 | Temp 98.9°F | Resp 16 | Ht 69.5 in | Wt 201.8 lb

## 2013-08-29 DIAGNOSIS — Z23 Encounter for immunization: Secondary | ICD-10-CM

## 2013-08-29 DIAGNOSIS — I10 Essential (primary) hypertension: Secondary | ICD-10-CM

## 2013-08-29 DIAGNOSIS — Z0000 Encounter for general adult medical examination without abnormal findings: Secondary | ICD-10-CM

## 2013-08-29 DIAGNOSIS — R7302 Impaired glucose tolerance (oral): Secondary | ICD-10-CM

## 2013-08-29 LAB — HEMOGLOBIN A1C
Hgb A1c MFr Bld: 5.8 % — ABNORMAL HIGH (ref <5.7)
Mean Plasma Glucose: 120 mg/dL — ABNORMAL HIGH (ref <117)

## 2013-08-29 LAB — CBC WITH DIFFERENTIAL/PLATELET
BASOS ABS: 0 10*3/uL (ref 0.0–0.1)
Basophils Relative: 1 % (ref 0–1)
EOS ABS: 0.1 10*3/uL (ref 0.0–0.7)
EOS PCT: 2 % (ref 0–5)
HCT: 40.1 % (ref 39.0–52.0)
Hemoglobin: 14.4 g/dL (ref 13.0–17.0)
LYMPHS PCT: 17 % (ref 12–46)
Lymphs Abs: 1.4 10*3/uL (ref 0.7–4.0)
MCH: 30.4 pg (ref 26.0–34.0)
MCHC: 35.9 g/dL (ref 30.0–36.0)
MCV: 84.8 fL (ref 78.0–100.0)
Monocytes Absolute: 0.4 10*3/uL (ref 0.1–1.0)
Monocytes Relative: 5 % (ref 3–12)
Neutro Abs: 6.2 10*3/uL (ref 1.7–7.7)
Neutrophils Relative %: 75 % (ref 43–77)
PLATELETS: 179 10*3/uL (ref 150–400)
RBC: 4.73 MIL/uL (ref 4.22–5.81)
RDW: 13.8 % (ref 11.5–15.5)
WBC: 8.2 10*3/uL (ref 4.0–10.5)

## 2013-08-29 LAB — COMPLETE METABOLIC PANEL WITH GFR
ALT: 19 U/L (ref 0–53)
AST: 20 U/L (ref 0–37)
Albumin: 4.1 g/dL (ref 3.5–5.2)
Alkaline Phosphatase: 73 U/L (ref 39–117)
BILIRUBIN TOTAL: 0.6 mg/dL (ref 0.3–1.2)
BUN: 13 mg/dL (ref 6–23)
CO2: 29 mEq/L (ref 19–32)
CREATININE: 0.74 mg/dL (ref 0.50–1.35)
Calcium: 8.9 mg/dL (ref 8.4–10.5)
Chloride: 104 mEq/L (ref 96–112)
GFR, Est African American: >89 mL/min
GFR, Est Non African American: >89 mL/min
Glucose, Bld: 105 mg/dL — ABNORMAL HIGH (ref 70–99)
Potassium: 3.8 mEq/L (ref 3.5–5.3)
Sodium: 140 mEq/L (ref 135–145)
Total Protein: 7.1 g/dL (ref 6.0–8.3)

## 2013-08-29 LAB — POCT UA - MICROSCOPIC ONLY
BACTERIA, U MICROSCOPIC: NEGATIVE
CRYSTALS, UR, HPF, POC: NEGATIVE
Casts, Ur, LPF, POC: NEGATIVE
Mucus, UA: NEGATIVE
WBC, UR, HPF, POC: NEGATIVE
Yeast, UA: NEGATIVE

## 2013-08-29 LAB — POCT URINALYSIS DIPSTICK
Bilirubin, UA: NEGATIVE
GLUCOSE UA: NEGATIVE
Ketones, UA: NEGATIVE
Leukocytes, UA: NEGATIVE
Nitrite, UA: NEGATIVE
Protein, UA: NEGATIVE
SPEC GRAV UA: 1.02
Urobilinogen, UA: 0.2
pH, UA: 7

## 2013-08-29 LAB — LIPID PANEL
Cholesterol: 149 mg/dL (ref 0–200)
HDL: 51 mg/dL (ref >39)
LDL CALC: 87 mg/dL (ref 0–99)
TRIGLYCERIDES: 57 mg/dL (ref <150)
Total CHOL/HDL Ratio: 2.9 Ratio
VLDL: 11 mg/dL (ref 0–40)

## 2013-08-29 LAB — TSH: TSH: 0.711 u[IU]/mL (ref 0.350–4.500)

## 2013-08-29 MED ORDER — CLONIDINE HCL 0.1 MG PO TABS: 0.1000 mg | ORAL_TABLET | Freq: Three times a day (TID) | ORAL | Status: DC

## 2013-08-29 MED ORDER — FLUTICASONE PROPIONATE 50 MCG/ACT NA SUSP: 2.0000 | Freq: Every day | NASAL | Status: DC

## 2013-08-29 MED ORDER — DILTIAZEM HCL ER COATED BEADS 180 MG PO CP24: 180.0000 mg | ORAL_CAPSULE | Freq: Two times a day (BID) | ORAL | Status: DC

## 2013-08-29 MED ORDER — TADALAFIL 20 MG PO TABS: 20.0000 mg | ORAL_TABLET | Freq: Every day | ORAL | Status: DC | PRN

## 2013-08-29 MED ORDER — ZOSTER VACCINE LIVE 19400 UNT/0.65ML ~~LOC~~ SOLR
0.6500 mL | Freq: Once | SUBCUTANEOUS | Status: DC
Start: 2013-08-29 — End: 2014-11-27

## 2013-08-29 MED ORDER — ENALAPRIL MALEATE 20 MG PO TABS: 20.0000 mg | ORAL_TABLET | Freq: Two times a day (BID) | ORAL | Status: DC

## 2013-08-29 NOTE — Progress Notes (Signed)
Subjective:    Patient ID: Juan Chavez, male    DOB: Jan 05, 1951, 63 y.o.   MRN: [id number]  HPI This 63 y.o. male presents for Complete Physical Examination.  Last physical 08/02/2012. Colonoscopy 01/2013.  Normal.  Repeat in 5 years. [facility]. TDAP 04/28/2011. Pneumovax 2000. Zostavax never; shingles torso 1997. Influenza vaccine 05/29/13. Eye exam 07/2013; +glasses; [facility]; no glaucoma or cataracts. Dental exam every six months; [facility].  Prostate Cancer: followed every year.  Checks PSA bid.  Followed by two different specialists at [facility].  HTN: Patient reports good compliance with medication, good tolerance to medication, and good symptom control.  Home BP running good.      Review of Systems  Constitutional: Negative.  Negative for fever, chills, diaphoresis, activity change, appetite change, fatigue and unexpected weight change.  HENT: Negative.  Negative for congestion, dental problem, drooling, ear discharge, ear pain, facial swelling, hearing loss, mouth sores, nosebleeds, postnasal drip, rhinorrhea, sinus pressure, sneezing, sore throat, tinnitus, trouble swallowing and voice change.   Eyes: Negative.  Negative for photophobia, pain, discharge, redness, itching and visual disturbance.  Respiratory: Negative.  Negative for apnea, cough, choking, chest tightness, shortness of breath, wheezing and stridor.   Cardiovascular: Negative.  Negative for chest pain, palpitations and leg swelling.  Gastrointestinal: Negative.  Negative for nausea, vomiting, abdominal pain, diarrhea, constipation and blood in stool.  Endocrine: Negative.  Negative for cold intolerance, heat intolerance, polydipsia, polyphagia and polyuria.  Genitourinary: Negative.  Negative for dysuria, urgency, frequency, hematuria, flank pain, decreased urine volume, discharge, penile swelling, scrotal swelling, enuresis, difficulty urinating, genital sores, penile pain and testicular pain.    Musculoskeletal: Negative.  Negative for myalgias, back pain, joint swelling, arthralgias, gait problem, neck pain and neck stiffness.  Skin: Negative.  Negative for color change, pallor, rash and wound.  Allergic/Immunologic: Negative.  Negative for environmental allergies, food allergies and immunocompromised state.  Neurological: Negative.  Negative for dizziness, tremors, seizures, syncope, facial asymmetry, speech difficulty, weakness, light-headedness, numbness and headaches.  Hematological: Negative.  Negative for adenopathy. Does not bruise/bleed easily.  Psychiatric/Behavioral: Negative.  Negative for suicidal ideas, hallucinations, behavioral problems, confusion, sleep disturbance, self-injury, dysphoric mood, decreased concentration and agitation. The patient is not nervous/anxious and is not hyperactive.    Past Medical History  Diagnosis Date  . Personal history of urinary calculi   . Hematuria, unspecified   . Other abnormal glucose   . Unspecified vitamin D deficiency   . Other B-complex deficiencies   . Personal history of malignant neoplasm of prostate   . Pure hypercholesterolemia   . Allergic rhinitis, cause unspecified   . Essential hypertension, benign   . Nonspecific abnormal electrocardiogram (ECG) (EKG)   . Impotence of organic origin   . Obstructive sleep apnea (adult) (pediatric)     CPAP  . Unspecified urinary incontinence   . Chicken pox     childhood  . Measles     childhood  . Mumps     childhood  . Cancer     Prostate cancer  . Allergy    Past Surgical History  Procedure Laterality Date  . Elbow surgery    . Prostatectomy  2005  . Urological surgery  07/2009  . Colonoscopy  01/22/2013    normal.  [facility].  Repeat 5 years.   No Known Allergies Current Outpatient Prescriptions on File Prior to Visit  Medication Sig Dispense Refill  . aspirin 81 MG tablet Take 81 mg by mouth daily.    Marland Kitchen  cholecalciferol (VITAMIN D) 1000 UNITS tablet Take 1,000  Units by mouth 2 (two) times daily.    . Cyanocobalamin (VITAMIN B-12 PO) Take by mouth daily.    Marland Kitchen MYRBETRIQ 50 MG TB24 Take 1 tablet by mouth daily. For urinary incontinence     No current facility-administered medications on file prior to visit.   History   Social History  . Marital Status: Married    Spouse Name: N/A    Number of Children: 2  . Years of Education: N/A   Occupational History  . [employer]    Social History Main Topics  . Smoking status: Never Smoker   . Smokeless tobacco: Never Used  . Alcohol Use: Yes     Comment: occasional drinks beer 2 per week  . Drug Use: No  . Sexual Activity:    Partners: Female   Other Topics Concern  . Not on file   Social History Narrative   Marital status:  Married x 42 years happily married.      Children:  2 children; 4 grandchildren.      Lives: with wife.  Children local.      Employment: [employer] x 43 years.        Tobacco:  Never.      Alcohol: [place] Wine 1 glass per day.      Drugs: none      Exercise: moderate, walking and running daily for 15 minutes.      Seatbelt:  Always uses seat belts.       Smoke alarm and carbon monoxide detector in the home.      Caffeine use: none.       Guns:  1 gun loaded; others unloaded.         Family History  Problem Relation Age of Onset  . Heart disease Father 38    AMI age 57; CM.  Marland Kitchen Hypertension Father   . Mental illness Sister     home in [place]  . Alcohol abuse Sister   . Hypertension Mother   . Heart disease Mother     AMI years ago; no CABG/stents.  . Arthritis Mother   . Heart disease Brother 41    AMI; CM       Objective:   Physical Exam  Constitutional: He is oriented to person, place, and time. He appears well-developed and well-nourished. No distress.  HENT:  Head: Normocephalic and atraumatic.  Right Ear: External ear normal.  Left Ear: External ear normal.  Nose: Nose normal.  Mouth/Throat: Oropharynx is clear and moist.   Eyes: Conjunctivae and EOM are normal. Pupils are equal, round, and reactive to light.  Neck: Normal range of motion. Neck supple. Carotid bruit is not present. No thyromegaly present.  Cardiovascular: Normal rate, regular rhythm, normal heart sounds and intact distal pulses.  Exam reveals no gallop and no friction rub.   No murmur heard. Pulmonary/Chest: Effort normal and breath sounds normal. He has no wheezes. He has no rales.  Abdominal: Soft. Bowel sounds are normal. He exhibits no distension and no mass. There is no tenderness. There is no rebound and no guarding. Hernia confirmed negative in the right inguinal area and confirmed negative in the left inguinal area.  Genitourinary: Testes normal and penis normal. Right testis shows no mass, no swelling and no tenderness. Left testis shows no mass, no swelling and no tenderness.  Active urinary leakage.  Musculoskeletal:       Right shoulder: Normal.  Left shoulder: Normal.       Cervical back: Normal.  Lymphadenopathy:    He has no cervical adenopathy.       Right: No inguinal adenopathy present.       Left: No inguinal adenopathy present.  Neurological: He is alert and oriented to person, place, and time. He has normal reflexes. No cranial nerve deficit. He exhibits normal muscle tone. Coordination normal.  Skin: Skin is warm and dry. No rash noted. He is not diaphoretic.  Psychiatric: He has a normal mood and affect. His behavior is normal. Judgment and thought content normal.       Assessment & Plan:  Routine general medical examination at a health care facility - Plan: CBC with Differential, COMPLETE METABOLIC PANEL WITH GFR, Lipid panel, Hemoglobin A1c, TSH, Vit D  25 hydroxy (rtn osteoporosis monitoring), POCT urinalysis dipstick, EKG 12-Lead, POCT UA - Microscopic Only  Need for shingles vaccine - Plan: zoster vaccine live, PF, (ZOSTAVAX) 02725 UNT/0.65ML injection  Essential hypertension  Glucose intolerance (impaired  glucose tolerance)   1. Complete Physical Examination: anticipatory guidance --- continued exercise, weight loss.  Immunizations reviewed; rx for Zostavax provided.  Colonoscopy UTD. 2.  HTN: moderately controlled; obtain labs, u/a. Refills provided. Known White Coat Syndrome. 3.  Glucose intolerance: stable with dietary modification; obtain labs. 4.  rx for Zostavax provided; to contact insurance regarding coverage and appropriate location of vaccine. 5. Prostate cancer with urinary incontinence: stable; followed by [facility].  Meds ordered this encounter  Medications  . zoster vaccine live, PF, (ZOSTAVAX) 36644 UNT/0.65ML injection    Sig: Inject 19,400 Units into the skin once.    Dispense:  1 each    Refill:  0  . cloNIDine (CATAPRES) 0.1 MG tablet    Sig: Take 1 tablet (0.1 mg total) by mouth 3 (three) times daily.    Dispense:  270 tablet    Refill:  3  . DISCONTD: diltiazem (CARDIZEM CD) 180 MG 24 hr capsule    Sig: Take 1 capsule (180 mg total) by mouth 2 (two) times daily.    Dispense:  180 capsule    Refill:  3  . enalapril (VASOTEC) 20 MG tablet    Sig: Take 1 tablet (20 mg total) by mouth 2 (two) times daily.    Dispense:  180 tablet    Refill:  3  . fluticasone (FLONASE) 50 MCG/ACT nasal spray    Sig: Place 2 sprays into both nostrils daily.    Dispense:  16 g    Refill:  11  . tadalafil (CIALIS) 20 MG tablet    Sig: Take 1 tablet (20 mg total) by mouth daily as needed for erectile dysfunction.    Dispense:  8 tablet    Refill:  5   Reginia Forts, M.D.  [facility] [address] [place], [place]  [address] [phone] phone [phone] fax

## 2013-08-29 NOTE — Patient Instructions (Signed)
1.  Call insurance company to see if they pay for shingles vaccine. 2.  Also call insurance company to see WHERE you should receive shingles vaccine (pharmacy or doctor's office).

## 2013-08-30 LAB — VITAMIN D 25 HYDROXY (VIT D DEFICIENCY, FRACTURES): Vit D, 25-Hydroxy: 33 ng/mL (ref 30–89)

## 2013-09-04 ENCOUNTER — Encounter: Payer: Self-pay | Admitting: Family Medicine

## 2013-09-12 ENCOUNTER — Other Ambulatory Visit: Payer: Self-pay | Admitting: Family Medicine

## 2013-10-09 ENCOUNTER — Other Ambulatory Visit: Payer: Self-pay | Admitting: Family Medicine

## 2014-01-01 ENCOUNTER — Encounter: Payer: Self-pay | Admitting: Family Medicine

## 2014-01-01 ENCOUNTER — Ambulatory Visit (INDEPENDENT_AMBULATORY_CARE_PROVIDER_SITE_OTHER): Payer: 59 | Admitting: Family Medicine

## 2014-01-01 VITALS — BP 152/90 | HR 60 | Temp 98.0°F | Resp 16 | Ht 70.0 in | Wt 209.8 lb

## 2014-01-01 DIAGNOSIS — R109 Unspecified abdominal pain: Secondary | ICD-10-CM

## 2014-01-01 DIAGNOSIS — R7302 Impaired glucose tolerance (oral): Secondary | ICD-10-CM

## 2014-01-01 DIAGNOSIS — J309 Allergic rhinitis, unspecified: Secondary | ICD-10-CM

## 2014-01-01 DIAGNOSIS — R1030 Lower abdominal pain, unspecified: Secondary | ICD-10-CM

## 2014-01-01 DIAGNOSIS — I10 Essential (primary) hypertension: Secondary | ICD-10-CM

## 2014-01-01 DIAGNOSIS — R350 Frequency of micturition: Secondary | ICD-10-CM

## 2014-01-01 LAB — COMPLETE METABOLIC PANEL WITH GFR
ALK PHOS: 90 U/L (ref 39–117)
ALT: 18 U/L (ref 0–53)
AST: 17 U/L (ref 0–37)
Albumin: 4.2 g/dL (ref 3.5–5.2)
BUN: 14 mg/dL (ref 6–23)
CALCIUM: 8.7 mg/dL (ref 8.4–10.5)
CHLORIDE: 108 meq/L (ref 96–112)
CO2: 27 mEq/L (ref 19–32)
Creat: 0.81 mg/dL (ref 0.50–1.35)
GFR, Est African American: >89 mL/min
GFR, Est Non African American: >89 mL/min
GLUCOSE: 100 mg/dL — AB (ref 70–99)
POTASSIUM: 3.6 meq/L (ref 3.5–5.3)
Sodium: 140 mEq/L (ref 135–145)
TOTAL PROTEIN: 6.9 g/dL (ref 6.0–8.3)
Total Bilirubin: 0.6 mg/dL (ref 0.2–1.2)

## 2014-01-01 LAB — POCT URINALYSIS DIPSTICK
BILIRUBIN UA: NEGATIVE
Glucose, UA: NEGATIVE
KETONES UA: NEGATIVE
Leukocytes, UA: NEGATIVE
Nitrite, UA: NEGATIVE
PH UA: 6
Spec Grav, UA: 1.02
Urobilinogen, UA: 0.2

## 2014-01-01 LAB — CBC WITH DIFFERENTIAL/PLATELET
BASOS ABS: 0.1 10*3/uL (ref 0.0–0.1)
BASOS PCT: 1 % (ref 0–1)
Eosinophils Absolute: 0.2 10*3/uL (ref 0.0–0.7)
Eosinophils Relative: 2 % (ref 0–5)
HEMATOCRIT: 38.5 % — AB (ref 39.0–52.0)
HEMOGLOBIN: 13.8 g/dL (ref 13.0–17.0)
LYMPHS PCT: 22 % (ref 12–46)
Lymphs Abs: 1.7 10*3/uL (ref 0.7–4.0)
MCH: 29.4 pg (ref 26.0–34.0)
MCHC: 35.8 g/dL (ref 30.0–36.0)
MCV: 82.1 fL (ref 78.0–100.0)
MONO ABS: 0.3 10*3/uL (ref 0.1–1.0)
MONOS PCT: 4 % (ref 3–12)
NEUTROS PCT: 71 % (ref 43–77)
Neutro Abs: 5.3 10*3/uL (ref 1.7–7.7)
Platelets: 177 10*3/uL (ref 150–400)
RBC: 4.69 MIL/uL (ref 4.22–5.81)
RDW: 13.7 % (ref 11.5–15.5)
WBC: 7.5 10*3/uL (ref 4.0–10.5)

## 2014-01-01 LAB — POCT UA - MICROSCOPIC ONLY
CASTS, UR, LPF, POC: NEGATIVE
CRYSTALS, UR, HPF, POC: NEGATIVE
MUCUS UA: POSITIVE
YEAST UA: NEGATIVE

## 2014-01-01 NOTE — Progress Notes (Signed)
Subjective:  This chart was scribed for Reginia Forts, MD  by Stacy Gardner, [facility] Scribe. The patient was seen in room and the patient's care was started at 8:57 AM.  Patient ID: Juan Chavez, male    DOB: 11-05-1950, 63 y.o.   MRN: [id number]  01/01/2014  Follow-up and urianary frequency and left side groin pain x 2wks   HPI HPI Comments: Juan Chavez is a 63 y.o. male who arrives to the [facility] for a follow up of HTN. Pt states he feels great.Pt has frequency and left sided testicular pain. He has increased urinary incontinence. Denies fever, chills, diaphoresis, and hematuria. He is not having any difficulty with his HTN and reports that his BP is normal. His usual heart rate ranges 60's- 70's. He complains he have bilateral calf pain. He works 40-50 hours a week and is very active on his job. Pt states running and taking breaks makes the pain better. Denies leg cramps at night. Denies back pain.  He has heart palpitations with lifting heavy masses but denies having heat palpitations otherwise. Pt wears a C-Pap every night. Denies leg swelling, cough, and SOB. Denies abdominal pain. Denies heart burn. Denies constipation. He is on a year round rotation to see his Urologist.    Review of Systems  Constitutional: Negative for fever, chills, diaphoresis, activity change, appetite change and fatigue.  Respiratory: Negative for cough and shortness of breath.   Cardiovascular: Positive for palpitations. Negative for chest pain and leg swelling.  Gastrointestinal: Negative for nausea, vomiting, abdominal pain, diarrhea and constipation.  Endocrine: Negative for cold intolerance, heat intolerance, polydipsia, polyphagia and polyuria.  Genitourinary: Positive for urgency, frequency, enuresis and testicular pain. Negative for dysuria and hematuria.  Musculoskeletal: Positive for myalgias. Negative for back pain.  Skin: Negative for color change,  rash and wound.  Neurological: Negative for dizziness, tremors, seizures, syncope, facial asymmetry, speech difficulty, weakness, light-headedness, numbness and headaches.  Psychiatric/Behavioral: Negative for sleep disturbance and dysphoric mood. The patient is not nervous/anxious.     Past Medical History  Diagnosis Date  . Personal history of urinary calculi   . Hematuria, unspecified   . Other abnormal glucose   . Unspecified vitamin D deficiency   . Other B-complex deficiencies   . Personal history of malignant neoplasm of prostate   . Pure hypercholesterolemia   . Allergic rhinitis, cause unspecified   . Essential hypertension, benign   . Nonspecific abnormal electrocardiogram (ECG) (EKG)   . Impotence of organic origin   . Obstructive sleep apnea (adult) (pediatric)     CPAP  . Unspecified urinary incontinence   . Chicken pox     childhood  . Measles     childhood  . Mumps     childhood  . Cancer     Prostate cancer  . Allergy    No Known Allergies Current Outpatient Prescriptions  Medication Sig Dispense Refill  . aspirin 81 MG tablet Take 81 mg by mouth daily.    . cholecalciferol (VITAMIN D) 1000 UNITS tablet Take 1,000 Units by mouth 2 (two) times daily.    . cloNIDine (CATAPRES) 0.1 MG tablet Take 1 tablet (0.1 mg total) by mouth 3 (three) times daily. 270 tablet 3  . Cyanocobalamin (VITAMIN B-12 PO) Take by mouth daily.    Marland Kitchen ELMIRON 100 MG capsule TAKE 1 CAPSULE (100 MG TOTAL) BY MOUTH DAILY. 30 capsule 5  . enalapril (VASOTEC) 20 MG tablet Take  1 tablet (20 mg total) by mouth 2 (two) times daily. 180 tablet 3  . fluticasone (FLONASE) 50 MCG/ACT nasal spray Place 2 sprays into both nostrils daily. 16 g 11  . MYRBETRIQ 50 MG TB24 Take 1 tablet by mouth daily. For urinary incontinence    . tadalafil (CIALIS) 20 MG tablet Take 1 tablet (20 mg total) by mouth daily as needed for erectile dysfunction. 8 tablet 5  . diltiazem (CARDIZEM CD) 180 MG 24 hr capsule TAKE  ONE CAPSULE BY MOUTH TWICE A DAY 180 capsule 3  . zoster vaccine live, PF, (ZOSTAVAX) 10932 UNT/0.65ML injection Inject 19,400 Units into the skin once. 1 each 0   No current facility-administered medications for this visit.       Objective:    BP 152/90 mmHg  Pulse 60  Temp(Src) 98 F (36.7 C) (Oral)  Resp 16  Ht 5\' 10"  (1.778 m)  Wt 209 lb 12.8 oz (95.165 kg)  BMI 30.10 kg/m2  SpO2 99% Physical Exam  Constitutional: He is oriented to person, place, and time. He appears well-developed and well-nourished. No distress.  HENT:  Head: Normocephalic and atraumatic.  Right Ear: External ear normal.  Left Ear: External ear normal.  Nose: Nose normal.  Mouth/Throat: Oropharynx is clear and moist.  Eyes: Conjunctivae and EOM are normal. Pupils are equal, round, and reactive to light.  Neck: Normal range of motion. Neck supple. Carotid bruit is not present. No thyromegaly present.  Cardiovascular: Normal rate, regular rhythm, normal heart sounds and intact distal pulses.  Exam reveals no gallop and no friction rub.   No murmur heard. Pulmonary/Chest: Effort normal and breath sounds normal. No respiratory distress. He has no wheezes. He has no rales.  Abdominal: Soft. Bowel sounds are normal. He exhibits no distension and no mass. There is no tenderness. There is no rebound and no guarding.  Musculoskeletal: He exhibits no edema.  Lymphadenopathy:    He has no cervical adenopathy.  Neurological: He is alert and oriented to person, place, and time. No cranial nerve deficit.  Skin: Skin is warm and dry. No rash noted. He is not diaphoretic.  Psychiatric: He has a normal mood and affect. His behavior is normal.  Nursing note and vitals reviewed.       Assessment & Plan:  Essential hypertension, benign - Plan: CBC with Differential, COMPLETE METABOLIC PANEL WITH GFR  Urinary frequency - Plan: POCT urinalysis dipstick, POCT UA - Microscopic Only, Urine culture  Groin pain - Plan:  POCT urinalysis dipstick, POCT UA - Microscopic Only, Urine culture  Allergic rhinitis  Glucose intolerance (impaired glucose tolerance)   1. HTN: controlled; obtain labs; continue current medications; follow-up four months. 2.  Urinary frequency with groin pain:  New.  Send urine culture.   3.  Allergic Rhinitis:  Worsening; recommend Claritin daily; recommend Flonase daily. 4. Glucose Intolerance: stable; obtain labs; continue with dietary modification.  No orders of the defined types were placed in this encounter.    Return in about 3 months (around 04/03/2014) for recheck.  I personally performed the services described in this documentation, which was scribed in my presence.  The recorded information has been reviewed and is accurate.  Reginia Forts, M.D.  [facility] [address] [place], [place]  [address] [phone] phone [phone] fax

## 2014-01-02 LAB — URINE CULTURE

## 2014-03-08 ENCOUNTER — Ambulatory Visit (INDEPENDENT_AMBULATORY_CARE_PROVIDER_SITE_OTHER): Payer: 59 | Admitting: Family Medicine

## 2014-03-08 ENCOUNTER — Ambulatory Visit (INDEPENDENT_AMBULATORY_CARE_PROVIDER_SITE_OTHER): Payer: 59

## 2014-03-08 VITALS — BP 168/80 | HR 63 | Temp 97.8°F | Resp 24 | Ht 69.5 in | Wt 215.5 lb

## 2014-03-08 DIAGNOSIS — R350 Frequency of micturition: Secondary | ICD-10-CM

## 2014-03-08 DIAGNOSIS — M545 Low back pain, unspecified: Secondary | ICD-10-CM

## 2014-03-08 LAB — POCT UA - MICROSCOPIC ONLY
Bacteria, U Microscopic: NEGATIVE
Casts, Ur, LPF, POC: NEGATIVE
Crystals, Ur, HPF, POC: NEGATIVE
Mucus, UA: NEGATIVE
YEAST UA: NEGATIVE

## 2014-03-08 LAB — POCT URINALYSIS DIPSTICK
Bilirubin, UA: NEGATIVE
GLUCOSE UA: NEGATIVE
Ketones, UA: 15
LEUKOCYTES UA: NEGATIVE
Nitrite, UA: NEGATIVE
Protein, UA: NEGATIVE
Spec Grav, UA: 1.02
UROBILINOGEN UA: 0.2
pH, UA: 5.5

## 2014-03-08 MED ORDER — CIPROFLOXACIN HCL 500 MG PO TABS: 500.0000 mg | ORAL_TABLET | Freq: Two times a day (BID) | ORAL | Status: DC

## 2014-03-08 MED ORDER — METHOCARBAMOL 500 MG PO TABS: 500.0000 mg | ORAL_TABLET | Freq: Every evening | ORAL | Status: DC | PRN

## 2014-03-08 NOTE — Progress Notes (Signed)
Subjective:    Patient ID: Juan Chavez, male    DOB: 1950/12/14, 63 y.o.   MRN: [id number]  03/08/2014 This chart was scribed for [name], MD by Cathie Hoops, ED Scribe.  The patient's care was started at 7:29 PM.    Flank Pain   HPI HPI Comments: Juan Chavez is a 63 y.o. male who presents to the [facility] complaining of constant, right flank pain onset 3 days ago. Pt states his pain is currently 1/10. Pt states he had just returned from the beach when his pain started. Pt denies any known injury or overuse; he denies excessive lifting. Pt's pain is worsened with laying down. Pt's pain is relieved with ambulating. Pt denies radiating pain to the lower extremities or genitals. Pt states he has taken 2 tylenol every 8 hours with moderate relief. Pt states he has urinary urgency onset 4 days ago, increased from 1x/night to 5x/night. Pt denies dysuria, numbness or tingling in the lower extremities, blood in the urine, fever, coughing. Pt states he previously had a similar episode, was given antibiotics with improvement.  Pt is s/p prostatectomy for prostate cancer; he suffers with urinary incontinence requiring depends daily.  He has a history of nephrolithiasis as well; he does not feel like current symptoms are similar to kidney stone.   Review of Systems  Constitutional: Negative for fever, chills, diaphoresis and fatigue.  Gastrointestinal: Negative for nausea, vomiting and abdominal pain.  Genitourinary: Positive for urgency (5x/night), frequency and flank pain. Negative for dysuria, hematuria, decreased urine volume, discharge, penile swelling, scrotal swelling, difficulty urinating, genital sores, penile pain and testicular pain.  Musculoskeletal: Positive for back pain and myalgias.  Skin: Negative for rash.    Past Medical History  Diagnosis Date  . Personal history of urinary calculi   . Hematuria, unspecified   . Other abnormal glucose   .  Unspecified vitamin D deficiency   . Other B-complex deficiencies   . Personal history of malignant neoplasm of prostate   . Pure hypercholesterolemia   . Allergic rhinitis, cause unspecified   . Essential hypertension, benign   . Nonspecific abnormal electrocardiogram (ECG) (EKG)   . Impotence of organic origin   . Obstructive sleep apnea (adult) (pediatric)     CPAP  . Unspecified urinary incontinence   . Chicken pox     childhood  . Measles     childhood  . Mumps     childhood  . Cancer     Prostate cancer  . Allergy    Past Surgical History  Procedure Laterality Date  . Elbow surgery    . Prostatectomy  2005  . Urological surgery  07/2009  . Colonoscopy  01/22/2013    normal.  [facility].  Repeat 5 years.   No Known Allergies Current Outpatient Prescriptions  Medication Sig Dispense Refill  . aspirin 81 MG tablet Take 81 mg by mouth daily.      . cholecalciferol (VITAMIN D) 1000 UNITS tablet Take 1,000 Units by mouth 2 (two) times daily.      . cloNIDine (CATAPRES) 0.1 MG tablet Take 1 tablet (0.1 mg total) by mouth 3 (three) times daily.  270 tablet  3  . Cyanocobalamin (VITAMIN B-12 PO) Take by mouth daily.      Marland Kitchen diltiazem (CARDIZEM CD) 180 MG 24 hr capsule Take 1 capsule (180 mg total) by mouth 2 (two) times daily.  180 capsule  3  . ELMIRON 100 MG capsule  TAKE 1 CAPSULE (100 MG TOTAL) BY MOUTH DAILY.  30 capsule  5  . enalapril (VASOTEC) 20 MG tablet Take 1 tablet (20 mg total) by mouth 2 (two) times daily.  180 tablet  3  . fluticasone (FLONASE) 50 MCG/ACT nasal spray Place 2 sprays into both nostrils daily.  16 g  11  . MYRBETRIQ 50 MG TB24 Take 1 tablet by mouth daily. For urinary incontinence      . tadalafil (CIALIS) 20 MG tablet Take 1 tablet (20 mg total) by mouth daily as needed for erectile dysfunction.  8 tablet  5  . zoster vaccine live, PF, (ZOSTAVAX) 16109 UNT/0.65ML injection Inject 19,400 Units into the skin once.  1 each  0  . ciprofloxacin (CIPRO) 500 MG  tablet Take 1 tablet (500 mg total) by mouth 2 (two) times daily.  30 tablet  0  . methocarbamol (ROBAXIN) 500 MG tablet Take 1-2 tablets (500-1,000 mg total) by mouth at bedtime as needed for muscle spasms.  30 tablet  0   No current facility-administered medications for this visit.   History   Social History  . Marital Status: Married    Spouse Name: N/A    Number of Children: 2  . Years of Education: N/A   Occupational History  . [employer]    Social History Main Topics  . Smoking status: Never Smoker   . Smokeless tobacco: Never Used  . Alcohol Use: Yes     Comment: occasional drinks beer 2 per week  . Drug Use: No  . Sexual Activity: Yes    Partners: Female   Other Topics Concern  . Not on file   Social History Narrative   Marital status:  Married x 42 years happily married.      Children:  2 children; 4 grandchildren.      Lives: with wife.  Children local.      Employment: [employer] x 43 years.        Tobacco:  Never.      Alcohol: [place] Wine 1 glass per day.      Drugs: none      Exercise: moderate, walking and running daily for 15 minutes.      Seatbelt:  Always uses seat belts.       Smoke alarm and carbon monoxide detector in the home.      Caffeine use: none.       Guns:  1 gun loaded; others unloaded.             Objective:    Triage Vitals: BP 168/80  Pulse 63  Temp(Src) 97.8 F (36.6 C) (Oral)  Resp 24  Ht 5' 9.5" (1.765 m)  Wt 215 lb 8 oz (97.75 kg)  BMI 31.38 kg/m2  SpO2 99% Physical Exam  Nursing note and vitals reviewed. Constitutional: He is oriented to person, place, and time. He appears well-developed and well-nourished. No distress.  HENT:  Head: Normocephalic and atraumatic.  Eyes: Conjunctivae and EOM are normal. Pupils are equal, round, and reactive to light.  Neck: Normal range of motion. Neck supple. Carotid bruit is not present. No tracheal deviation present. No thyromegaly present.  Cardiovascular:  Normal rate, regular rhythm, normal heart sounds and intact distal pulses.  Exam reveals no gallop and no friction rub.   No murmur heard. Pulmonary/Chest: Effort normal and breath sounds normal. No respiratory distress. He has no wheezes. He has no rales.  Abdominal: Soft. Bowel sounds are normal.  He exhibits no distension and no mass. There is no tenderness. There is no rebound and no guarding.  No periumbilical tenderness.   Musculoskeletal: Normal range of motion.       Lumbar back: He exhibits normal range of motion, no tenderness, no pain and no spasm.  No midline tenderness or paraspinal TTP. +Full lumbar ROM no limitations and no pain. Straight leg raises negative and motor BLE 5/5.  Sensation intact.  Lymphadenopathy:    He has no cervical adenopathy.  Neurological: He is alert and oriented to person, place, and time. No cranial nerve deficit or sensory deficit.  Reflex Scores:      Patellar reflexes are 2+ on the right side and 2+ on the left side.      Achilles reflexes are 2+ on the right side and 2+ on the left side. Skin: Skin is warm and dry. No rash noted. He is not diaphoretic.  Psychiatric: He has a normal mood and affect. His behavior is normal.   Results for orders placed in visit on 03/08/14  POCT UA - MICROSCOPIC ONLY      Result Value Ref Range   WBC, Ur, HPF, POC 1-2     RBC, urine, microscopic 1-3     Bacteria, U Microscopic neg     Mucus, UA neg     Epithelial cells, urine per micros 0-1     Crystals, Ur, HPF, POC neg     Casts, Ur, LPF, POC neg     Yeast, UA neg    POCT URINALYSIS DIPSTICK      Result Value Ref Range   Color, UA yellow     Clarity, UA clear     Glucose, UA neg     Bilirubin, UA neg     Ketones, UA 15     Spec Grav, UA 1.020     Blood, UA small     pH, UA 5.5     Protein, UA neg     Urobilinogen, UA 0.2     Nitrite, UA neg     Leukocytes, UA Negative     [facility] reading (PRIMARY) by  Dr. Tamala Julian.  LUMBAR FILMS:  NAD; MILD DEGENERATIVE  CHANGES L1-L2; PREVIOUS PROSTATECTOMY.      Assessment & Plan:   1. Urinary frequency   2. Right low back pain, with sciatica presence unspecified    1. Urinary frequency: New. With increased nocturia. Send urine culture; treat empirically with Cipro 500mg  bid for 14 days. RTC for hematuria, worsening flank pain, fever>100.5.  2.  R lower back pain/R flank pain: New.  Associated with urinary frequency; continue Tylenol as needed for pain; rx for Robaxin provided to take qhs PRN pain.  Benign lumbar exam in office with excellent range of motion; thus, feel like lower back symptoms related to urinary symptoms.  Meds ordered this encounter  Medications  . ciprofloxacin (CIPRO) 500 MG tablet    Sig: Take 1 tablet (500 mg total) by mouth 2 (two) times daily.    Dispense:  30 tablet    Refill:  0  . methocarbamol (ROBAXIN) 500 MG tablet    Sig: Take 1-2 tablets (500-1,000 mg total) by mouth at bedtime as needed for muscle spasms.    Dispense:  30 tablet    Refill:  0    No Follow-up on file.   I personally performed the services described in this documentation, which was scribed in my presence.  The recorded information has been reviewed  and is accurate.  Reginia Forts, M.D.  [facility] [address] [place], [place]  [address] [phone] phone [phone] fax

## 2014-03-08 NOTE — Patient Instructions (Signed)
1. Continue Tylenol as needed for pain during the day. 2.  Take Robaxin at bedtime for muscle spasm. 3.  Take Cipro twice daily for urinary infection.

## 2014-03-10 LAB — URINE CULTURE

## 2014-04-02 ENCOUNTER — Encounter: Payer: Self-pay | Admitting: Family Medicine

## 2014-04-02 ENCOUNTER — Ambulatory Visit (INDEPENDENT_AMBULATORY_CARE_PROVIDER_SITE_OTHER): Payer: 59 | Admitting: Family Medicine

## 2014-04-02 VITALS — BP 161/93 | HR 68 | Temp 98.2°F | Resp 16 | Ht 69.5 in | Wt 219.8 lb

## 2014-04-02 DIAGNOSIS — R7309 Other abnormal glucose: Secondary | ICD-10-CM

## 2014-04-02 DIAGNOSIS — M5136 Other intervertebral disc degeneration, lumbar region: Secondary | ICD-10-CM

## 2014-04-02 DIAGNOSIS — M5137 Other intervertebral disc degeneration, lumbosacral region: Secondary | ICD-10-CM

## 2014-04-02 DIAGNOSIS — R350 Frequency of micturition: Secondary | ICD-10-CM

## 2014-04-02 DIAGNOSIS — M51379 Other intervertebral disc degeneration, lumbosacral region without mention of lumbar back pain or lower extremity pain: Secondary | ICD-10-CM

## 2014-04-02 DIAGNOSIS — I10 Essential (primary) hypertension: Secondary | ICD-10-CM

## 2014-04-02 LAB — COMPLETE METABOLIC PANEL WITH GFR
ALT: 28 U/L (ref 0–53)
AST: 22 U/L (ref 0–37)
Albumin: 4.2 g/dL (ref 3.5–5.2)
Alkaline Phosphatase: 79 U/L (ref 39–117)
BILIRUBIN TOTAL: 0.5 mg/dL (ref 0.2–1.2)
BUN: 19 mg/dL (ref 6–23)
CO2: 27 meq/L (ref 19–32)
Calcium: 9 mg/dL (ref 8.4–10.5)
Chloride: 104 mEq/L (ref 96–112)
Creat: 0.8 mg/dL (ref 0.50–1.35)
Glucose, Bld: 97 mg/dL (ref 70–99)
Potassium: 3.5 mEq/L (ref 3.5–5.3)
Sodium: 138 mEq/L (ref 135–145)
Total Protein: 6.9 g/dL (ref 6.0–8.3)

## 2014-04-02 LAB — CBC WITH DIFFERENTIAL/PLATELET
Basophils Absolute: 0.1 10*3/uL (ref 0.0–0.1)
Basophils Relative: 1 % (ref 0–1)
Eosinophils Absolute: 0.1 10*3/uL (ref 0.0–0.7)
Eosinophils Relative: 2 % (ref 0–5)
HCT: 38.2 % — ABNORMAL LOW (ref 39.0–52.0)
Hemoglobin: 13.4 g/dL (ref 13.0–17.0)
LYMPHS ABS: 1.5 10*3/uL (ref 0.7–4.0)
Lymphocytes Relative: 24 % (ref 12–46)
MCH: 29.5 pg (ref 26.0–34.0)
MCHC: 35.1 g/dL (ref 30.0–36.0)
MCV: 84.1 fL (ref 78.0–100.0)
Monocytes Absolute: 0.4 10*3/uL (ref 0.1–1.0)
Monocytes Relative: 7 % (ref 3–12)
Neutro Abs: 4.1 10*3/uL (ref 1.7–7.7)
Neutrophils Relative %: 66 % (ref 43–77)
Platelets: 165 10*3/uL (ref 150–400)
RBC: 4.54 MIL/uL (ref 4.22–5.81)
RDW: 14 % (ref 11.5–15.5)
WBC: 6.2 10*3/uL (ref 4.0–10.5)

## 2014-04-02 LAB — POCT URINALYSIS DIPSTICK
Bilirubin, UA: NEGATIVE
Glucose, UA: NEGATIVE
Ketones, UA: NEGATIVE
Leukocytes, UA: NEGATIVE
NITRITE UA: NEGATIVE
PH UA: 6
PROTEIN UA: NEGATIVE
Spec Grav, UA: 1.02
UROBILINOGEN UA: 0.2

## 2014-04-02 NOTE — Patient Instructions (Signed)
1. BRING IN BLOOD PRESSURE CUFF AT NEXT VISIT TO COMPARE TO OUR BLOOD PRESSURE CUFF.

## 2014-04-02 NOTE — Progress Notes (Signed)
Subjective:   This chart was scribed for Juan Honour, MD by Thea Alken, ED Scribe. This patient was seen in room 23 and the patient's care was started at 9:16 AM.   Patient ID: Juan Chavez, male    DOB: 1951/04/04, 63 y.o.   MRN: [id number]  HPI Chief Complaint  Patient presents with  . Hypertension    3 month check up   HPI Comments: Juan Chavez is a 63 y.o. male with h/o prostate cancer in 2005 who presents to the [facility] for a 3 month f/u regarding HTN.    Last seen 7/16 for urinary frequency and back pain. At that time urine culture was normal and back X-ray showed degenerative changes. Pt was prescribed cipro for UTI and robaxin for back strain.   Pt was seen May for HTN f/u. No changes were made at that visit. Today, Pt reports urine symptom are better as well as his back pain. Pt reports medication worked well.  Pt states his BP have been running well at home and denies any concerns. He reports they are usually around 120/70's-80's at home. Pt has not check BP in a week. Pt is taking all is medication without any issues. Pt denies CP, leg swelling and palpitation. Pt denies cough and SOB. He reports normal BM.   Pt see's urologist regularly.   He uses Flonase as needed with allergies flare up. Pt uses cpap every night. Pt is exercises and runs every morning around 5:30.     Past Medical History  Diagnosis Date  . Personal history of urinary calculi   . Hematuria, unspecified   . Other abnormal glucose   . Unspecified vitamin D deficiency   . Other B-complex deficiencies   . Personal history of malignant neoplasm of prostate   . Pure hypercholesterolemia   . Allergic rhinitis, cause unspecified   . Essential hypertension, benign   . Nonspecific abnormal electrocardiogram (ECG) (EKG)   . Impotence of organic origin   . Obstructive sleep apnea (adult) (pediatric)     CPAP  . Unspecified urinary incontinence   . Chicken pox     childhood    . Measles     childhood  . Mumps     childhood  . Cancer     Prostate cancer  . Allergy    Past Surgical History  Procedure Laterality Date  . Elbow surgery    . Prostatectomy  2005  . Urological surgery  07/2009  . Colonoscopy  01/22/2013    normal.  [facility].  Repeat 5 years.   Prior to Admission medications   Medication Sig Start Date End Date Taking? Authorizing Provider  aspirin 81 MG tablet Take 81 mg by mouth daily.   Yes Historical Provider, MD  cholecalciferol (VITAMIN D) 1000 UNITS tablet Take 1,000 Units by mouth 2 (two) times daily.   Yes Historical Provider, MD  ciprofloxacin (CIPRO) 500 MG tablet Take 1 tablet (500 mg total) by mouth 2 (two) times daily. 03/08/14  Yes Juan Honour, MD  cloNIDine (CATAPRES) 0.1 MG tablet Take 1 tablet (0.1 mg total) by mouth 3 (three) times daily. 08/29/13  Yes Juan Honour, MD  Cyanocobalamin (VITAMIN B-12 PO) Take by mouth daily.   Yes Historical Provider, MD  diltiazem (CARDIZEM CD) 180 MG 24 hr capsule Take 1 capsule (180 mg total) by mouth 2 (two) times daily. 08/29/13  Yes Juan Honour, MD  ELMIRON 100 MG capsule TAKE  1 CAPSULE (100 MG TOTAL) BY MOUTH DAILY. 09/12/13  Yes Juan Honour, MD  enalapril (VASOTEC) 20 MG tablet Take 1 tablet (20 mg total) by mouth 2 (two) times daily. 08/29/13  Yes Juan Honour, MD  fluticasone (FLONASE) 50 MCG/ACT nasal spray Place 2 sprays into both nostrils daily. 08/29/13  Yes Juan Honour, MD  methocarbamol (ROBAXIN) 500 MG tablet Take 1-2 tablets (500-1,000 mg total) by mouth at bedtime as needed for muscle spasms. 03/08/14  Yes Juan Honour, MD  MYRBETRIQ 50 MG TB24 Take 1 tablet by mouth daily. For urinary incontinence 07/26/12  Yes Historical Provider, MD  tadalafil (CIALIS) 20 MG tablet Take 1 tablet (20 mg total) by mouth daily as needed for erectile dysfunction. 08/29/13  Yes Juan Honour, MD  zoster vaccine live, PF, (ZOSTAVAX) 53664 UNT/0.65ML injection Inject 19,400 Units into the skin once.  08/29/13  Yes Juan Honour, MD   History   Social History  . Marital Status: Married    Spouse Name: N/A    Number of Children: 2  . Years of Education: N/A   Occupational History  . [employer]    Social History Main Topics  . Smoking status: Never Smoker   . Smokeless tobacco: Never Used  . Alcohol Use: Yes     Comment: occasional drinks beer 2 per week  . Drug Use: No  . Sexual Activity: Yes    Partners: Female   Other Topics Concern  . Not on file   Social History Narrative   Marital status:  Married x 42 years happily married.      Children:  2 children; 4 grandchildren.      Lives: with wife.  Children local.      Employment: [employer] x 43 years.        Tobacco:  Never.      Alcohol: [place] Wine 1 glass per day.      Drugs: none      Exercise: moderate, walking and running daily for 15 minutes.      Seatbelt:  Always uses seat belts.       Smoke alarm and carbon monoxide detector in the home.      Caffeine use: none.       Guns:  1 gun loaded; others unloaded.          Review of Systems  Constitutional: Negative for fever, chills, diaphoresis, activity change, appetite change and fatigue.  Eyes: Negative for visual disturbance.  Respiratory: Negative for cough and shortness of breath.   Cardiovascular: Negative for chest pain, palpitations and leg swelling.  Gastrointestinal: Negative for abdominal pain, constipation, blood in stool, anal bleeding and rectal pain.  Endocrine: Negative for cold intolerance, heat intolerance, polydipsia, polyphagia and polyuria.  Genitourinary: Negative for urgency, frequency and hematuria.  Musculoskeletal: Negative for back pain and myalgias.  Neurological: Negative for dizziness, tremors, seizures, syncope, facial asymmetry, speech difficulty, weakness, light-headedness, numbness and headaches.    Objective:   Physical Exam  Nursing note and vitals reviewed. Constitutional: He is oriented to person,  place, and time. He appears well-developed and well-nourished. No distress.  HENT:  Head: Normocephalic and atraumatic.  Right Ear: Hearing, tympanic membrane, external ear and ear canal normal.  Left Ear: Hearing, tympanic membrane, external ear and ear canal normal.  Nose: Nose normal.  Mouth/Throat: Uvula is midline, oropharynx is clear and moist and mucous membranes are normal.  Eyes: Conjunctivae and EOM are normal.  Pupils are equal, round, and reactive to light.  Neck: Normal range of motion. Neck supple. Carotid bruit is not present. No thyromegaly present.  Cardiovascular: Normal rate, regular rhythm, normal heart sounds and intact distal pulses.  Exam reveals no gallop and no friction rub.   No murmur heard. BP-160/92  Pulmonary/Chest: Effort normal and breath sounds normal. He has no wheezes. He has no rales.  Abdominal: Soft. Bowel sounds are normal. He exhibits no distension and no mass. There is no tenderness. There is no rebound and no guarding.  Musculoskeletal: Normal range of motion.       Lumbar back: Normal. He exhibits normal range of motion, no tenderness, no bony tenderness, no swelling, no pain and no spasm.  Lymphadenopathy:    He has no cervical adenopathy.  Neurological: He is alert and oriented to person, place, and time. No cranial nerve deficit.  Skin: Skin is warm and dry. No rash noted. He is not diaphoretic.  Psychiatric: He has a normal mood and affect. His behavior is normal.   Filed Vitals:   04/02/14 0851  BP: 161/93  Pulse: 68  Temp: 98.2 F (36.8 C)  Resp: 16    Assessment & Plan:   1. Other abnormal glucose   2. Essential hypertension, benign   3. Urinary frequency   4. Degenerative disc disease, lumbar    1.  HTN: moderately controlled; has known White Coat Syndrome; normal home readings; obtain labs; continue currentmedications. 2.  Glucose intolerance: stable; continue with dietary modification, exercise, weight loss. 3.  Urinary  frequency: resolved since last visit. 4.  DDD lumbar spine with recent lower back pain:  Improved/resolved.   No orders of the defined types were placed in this encounter.    I personally performed the services described in this documentation, which was scribed in my presence.  The recorded information has been reviewed and is accurate.   Reginia Forts, M.D.  [facility] [address] [place], [place]  [address] [phone] phone [phone] fax

## 2014-04-16 ENCOUNTER — Encounter: Payer: Self-pay | Admitting: *Deleted

## 2014-05-17 ENCOUNTER — Other Ambulatory Visit: Payer: Self-pay | Admitting: Family Medicine

## 2014-05-17 ENCOUNTER — Other Ambulatory Visit: Payer: Self-pay | Admitting: Physician Assistant

## 2014-07-02 ENCOUNTER — Encounter: Payer: Self-pay | Admitting: Family Medicine

## 2014-07-02 ENCOUNTER — Ambulatory Visit (INDEPENDENT_AMBULATORY_CARE_PROVIDER_SITE_OTHER): Payer: 59 | Admitting: Family Medicine

## 2014-07-02 VITALS — BP 150/88 | HR 61 | Temp 97.6°F | Resp 16 | Ht 70.0 in | Wt 222.8 lb

## 2014-07-02 DIAGNOSIS — M5136 Other intervertebral disc degeneration, lumbar region: Secondary | ICD-10-CM

## 2014-07-02 DIAGNOSIS — Z23 Encounter for immunization: Secondary | ICD-10-CM

## 2014-07-02 DIAGNOSIS — I10 Essential (primary) hypertension: Secondary | ICD-10-CM

## 2014-07-02 DIAGNOSIS — R350 Frequency of micturition: Secondary | ICD-10-CM

## 2014-07-02 DIAGNOSIS — E669 Obesity, unspecified: Secondary | ICD-10-CM

## 2014-07-02 DIAGNOSIS — R7302 Impaired glucose tolerance (oral): Secondary | ICD-10-CM

## 2014-07-02 LAB — COMPLETE METABOLIC PANEL WITH GFR
ALT: 25 U/L (ref 0–53)
AST: 19 U/L (ref 0–37)
Albumin: 4.3 g/dL (ref 3.5–5.2)
Alkaline Phosphatase: 87 U/L (ref 39–117)
BUN: 12 mg/dL (ref 6–23)
CO2: 30 mEq/L (ref 19–32)
Calcium: 8.8 mg/dL (ref 8.4–10.5)
Chloride: 103 mEq/L (ref 96–112)
Creat: 0.82 mg/dL (ref 0.50–1.35)
GFR, Est African American: >89 mL/min
GLUCOSE: 107 mg/dL — AB (ref 70–99)
Potassium: 3.8 mEq/L (ref 3.5–5.3)
Sodium: 140 mEq/L (ref 135–145)
TOTAL PROTEIN: 6.9 g/dL (ref 6.0–8.3)
Total Bilirubin: 0.6 mg/dL (ref 0.2–1.2)

## 2014-07-02 LAB — CBC WITH DIFFERENTIAL/PLATELET
BASOS PCT: 1 % (ref 0–1)
Basophils Absolute: 0.1 10*3/uL (ref 0.0–0.1)
EOS ABS: 0.1 10*3/uL (ref 0.0–0.7)
Eosinophils Relative: 2 % (ref 0–5)
HCT: 40.1 % (ref 39.0–52.0)
HEMOGLOBIN: 13.8 g/dL (ref 13.0–17.0)
Lymphocytes Relative: 22 % (ref 12–46)
Lymphs Abs: 1.3 10*3/uL (ref 0.7–4.0)
MCH: 29.4 pg (ref 26.0–34.0)
MCHC: 34.4 g/dL (ref 30.0–36.0)
MCV: 85.5 fL (ref 78.0–100.0)
MONOS PCT: 6 % (ref 3–12)
Monocytes Absolute: 0.3 10*3/uL (ref 0.1–1.0)
Neutro Abs: 4 10*3/uL (ref 1.7–7.7)
Neutrophils Relative %: 69 % (ref 43–77)
PLATELETS: 179 10*3/uL (ref 150–400)
RBC: 4.69 MIL/uL (ref 4.22–5.81)
RDW: 13.7 % (ref 11.5–15.5)
WBC: 5.8 10*3/uL (ref 4.0–10.5)

## 2014-07-02 MED ORDER — CLONIDINE HCL 0.1 MG PO TABS: 0.1000 mg | ORAL_TABLET | Freq: Three times a day (TID) | ORAL | Status: DC

## 2014-07-02 MED ORDER — TADALAFIL 20 MG PO TABS: 20.0000 mg | ORAL_TABLET | Freq: Every day | ORAL | Status: DC | PRN

## 2014-07-02 MED ORDER — ENALAPRIL MALEATE 20 MG PO TABS: 20.0000 mg | ORAL_TABLET | Freq: Two times a day (BID) | ORAL | Status: DC

## 2014-07-02 NOTE — Progress Notes (Signed)
   Subjective:    Patient ID: Juan Chavez, male    DOB: 05/17/1951, 63 y.o.   MRN: [id number]  HPI   63 year old male is here today for follow up and influenza vaccine.    His BP is 135/84 and 140/90 at home.  He takes it 2-3x/week.  He states that he is managing his diet well with eating a lot of salads and eating chicken and [place].  He does not consume pork, canned or frozen foods.  He denies chest pain, palpitations, leg swelling, dizziness, or vision changes.  He exercises daily 15-30 minutes of walking.  He also does a considerable amount of movement and physical labor with his job at [employer].    Glucose Intolerance: Reports no nausea, vomiting, vision changes, or diarrhea.  He tries to be aware of his sugar intake, though these holiday months (halloween...) has been a little laxed.  He reports that he eats a lot of fruits.    Urinary Frequency: He reports no urinary dribbling, incontinence, frequency, or dysuria.    Lumbar Spine: Back pain has resolved.       Review of Systems  Respiratory: Negative for cough.   Cardiovascular: Negative for chest pain, palpitations and leg swelling.  Gastrointestinal: Negative for nausea, vomiting and diarrhea.  Genitourinary: Negative for dysuria, urgency, frequency, decreased urine volume and difficulty urinating.  Neurological: Negative for dizziness.       Objective:   Physical Exam  Constitutional: He is oriented to person, place, and time. He appears well-developed and well-nourished. No distress.  BP 150/88 mmHg  Pulse 61  Temp(Src) 97.6 F (36.4 C) (Oral)  Resp 16  Ht 5\' 10"  (1.778 m)  Wt 222 lb 12.8 oz (101.061 kg)  BMI 31.97 kg/m2  SpO2 97%   HENT:  Head: Normocephalic and atraumatic.  Eyes: Conjunctivae are normal. Pupils are equal, round, and reactive to light. Right eye exhibits no discharge. Left eye exhibits no discharge.  Cardiovascular: Normal rate, regular rhythm, normal heart sounds and intact distal pulses.   Exam reveals no gallop, no distant heart sounds and no friction rub.   No murmur heard. Pulmonary/Chest: Effort normal and breath sounds normal. No respiratory distress. He has no decreased breath sounds. He has no wheezes.  Musculoskeletal: He exhibits no edema.  Neurological: He is alert and oriented to person, place, and time.  Psychiatric: He has a normal mood and affect. His speech is normal. Judgment and thought content normal. Cognition and memory are normal.          Assessment & Plan:  63 year old male with PMH of HTN, DDD lumbar, glucose intolerance, urinary frequency is here today for a follow up and influenza vaccine.    Need for prophylactic vaccination and inoculation against influenza  Flu Vaccine QUAD 36+ mos IM given  Essential hypertension, benign -White coat syndrome with BP readings at home slightly above normal.  BP rechecked at 150/88.  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  Requested that the BP be written every time it is checked at home, so at next visit we can make necessary changes.   -CMET ordered Cbc ordered  Urinary frequency -Stable  Degenerative disc disease, lumbar -Stable  Glucose intolerance (impaired glucose tolerance) -a1c: will follow up with results  Ivar Drape, PA-C [facility] 11/9/20159:13 AM

## 2014-07-03 DIAGNOSIS — E669 Obesity, unspecified: Secondary | ICD-10-CM | POA: Insufficient documentation

## 2014-07-03 LAB — HEMOGLOBIN A1C
HEMOGLOBIN A1C: 6.1 % — AB (ref <5.7)
Mean Plasma Glucose: 128 mg/dL — ABNORMAL HIGH (ref <117)

## 2014-07-03 NOTE — Progress Notes (Signed)
History and physical examinations obtained with Ivar Drape, PA-C.  Agree with assessment and plan.  Recommend weight loss, exercise, dietary modification for obesity.

## 2014-09-18 ENCOUNTER — Other Ambulatory Visit: Payer: Self-pay | Admitting: Family Medicine

## 2014-10-22 ENCOUNTER — Telehealth: Payer: Self-pay | Admitting: Family Medicine

## 2014-10-22 DIAGNOSIS — G4733 Obstructive sleep apnea (adult) (pediatric): Secondary | ICD-10-CM

## 2014-10-22 DIAGNOSIS — Z9989 Dependence on other enabling machines and devices: Principal | ICD-10-CM

## 2014-10-22 NOTE — Telephone Encounter (Signed)
Patient requesting prescription for new CPAP machine with humidifier; also needs to know pressure of CPAP machine.  Fax to [phone].  Requesting smaller machine.

## 2014-11-05 ENCOUNTER — Encounter: Payer: Self-pay | Admitting: Family Medicine

## 2014-11-05 ENCOUNTER — Ambulatory Visit (INDEPENDENT_AMBULATORY_CARE_PROVIDER_SITE_OTHER): Payer: 59 | Admitting: Family Medicine

## 2014-11-05 VITALS — BP 164/90 | HR 73 | Temp 98.3°F | Resp 16 | Ht 70.0 in | Wt 255.6 lb

## 2014-11-05 DIAGNOSIS — I10 Essential (primary) hypertension: Secondary | ICD-10-CM | POA: Diagnosis not present

## 2014-11-05 DIAGNOSIS — J301 Allergic rhinitis due to pollen: Secondary | ICD-10-CM

## 2014-11-05 DIAGNOSIS — Z13220 Encounter for screening for lipoid disorders: Secondary | ICD-10-CM

## 2014-11-05 DIAGNOSIS — R7302 Impaired glucose tolerance (oral): Secondary | ICD-10-CM | POA: Diagnosis not present

## 2014-11-05 DIAGNOSIS — Z0000 Encounter for general adult medical examination without abnormal findings: Secondary | ICD-10-CM | POA: Diagnosis not present

## 2014-11-05 LAB — COMPREHENSIVE METABOLIC PANEL
ALT: 25 U/L (ref 0–53)
AST: 21 U/L (ref 0–37)
Albumin: 3.7 g/dL (ref 3.5–5.2)
Alkaline Phosphatase: 88 U/L (ref 39–117)
BILIRUBIN TOTAL: 0.5 mg/dL (ref 0.2–1.2)
BUN: 12 mg/dL (ref 6–23)
CO2: 24 mEq/L (ref 19–32)
CREATININE: 0.75 mg/dL (ref 0.50–1.35)
Calcium: 8.5 mg/dL (ref 8.4–10.5)
Chloride: 106 mEq/L (ref 96–112)
Glucose, Bld: 116 mg/dL — ABNORMAL HIGH (ref 70–99)
Potassium: 3.7 mEq/L (ref 3.5–5.3)
Sodium: 141 mEq/L (ref 135–145)
Total Protein: 6.7 g/dL (ref 6.0–8.3)

## 2014-11-05 LAB — CBC WITH DIFFERENTIAL/PLATELET
BASOS ABS: 0.1 10*3/uL (ref 0.0–0.1)
Basophils Relative: 1 % (ref 0–1)
EOS PCT: 2 % (ref 0–5)
Eosinophils Absolute: 0.1 10*3/uL (ref 0.0–0.7)
HCT: 39.5 % (ref 39.0–52.0)
HEMOGLOBIN: 13.7 g/dL (ref 13.0–17.0)
LYMPHS PCT: 20 % (ref 12–46)
Lymphs Abs: 1.4 10*3/uL (ref 0.7–4.0)
MCH: 29.5 pg (ref 26.0–34.0)
MCHC: 34.7 g/dL (ref 30.0–36.0)
MCV: 85.1 fL (ref 78.0–100.0)
MPV: 10.4 fL (ref 8.6–12.4)
Monocytes Absolute: 0.3 10*3/uL (ref 0.1–1.0)
Monocytes Relative: 5 % (ref 3–12)
NEUTROS ABS: 4.9 10*3/uL (ref 1.7–7.7)
NEUTROS PCT: 72 % (ref 43–77)
PLATELETS: 179 10*3/uL (ref 150–400)
RBC: 4.64 MIL/uL (ref 4.22–5.81)
RDW: 14.3 % (ref 11.5–15.5)
WBC: 6.8 10*3/uL (ref 4.0–10.5)

## 2014-11-05 LAB — POCT URINALYSIS DIPSTICK
Bilirubin, UA: NEGATIVE
Glucose, UA: NEGATIVE
KETONES UA: NEGATIVE
Leukocytes, UA: NEGATIVE
Nitrite, UA: NEGATIVE
PH UA: 7
Protein, UA: NEGATIVE
Spec Grav, UA: 1.015
Urobilinogen, UA: 0.2

## 2014-11-05 LAB — LIPID PANEL
Cholesterol: 154 mg/dL (ref 0–200)
HDL: 45 mg/dL (ref >=40)
LDL Cholesterol: 92 mg/dL (ref 0–99)
TRIGLYCERIDES: 87 mg/dL (ref <150)
Total CHOL/HDL Ratio: 3.4 Ratio
VLDL: 17 mg/dL (ref 0–40)

## 2014-11-05 LAB — TSH: TSH: 0.769 u[IU]/mL (ref 0.350–4.500)

## 2014-11-05 LAB — HEMOGLOBIN A1C
Hgb A1c MFr Bld: 6.3 % — ABNORMAL HIGH (ref <5.7)
Mean Plasma Glucose: 134 mg/dL — ABNORMAL HIGH (ref <117)

## 2014-11-05 MED ORDER — LORATADINE 10 MG PO TABS: 10.0000 mg | ORAL_TABLET | Freq: Every day | ORAL | Status: DC

## 2014-11-05 NOTE — Progress Notes (Signed)
Subjective:    Patient ID: Juan Chavez, male    DOB: 09-Aug-1951, 64 y.o.   MRN: [id number]  11/05/2014  Annual Exam   HPI This 64 y.o. male presents for Complete Physical Examination.  Last physical:  08/29/2013 Colonoscopy: 01/2013; repeat 5 years.  Terance Hart, [facility]. TDAP: 2009 Pneumovax:   2000 Zostavax:  never Influenza:   07/02/2014 Eye exam:  +glasses; last eye exam 2015; yearly.  No glaucoma or cataracts. Dental exam:  Every six months.  HTN: blood pressure running really good at home; 814-481 systolic; 85U diastolic.  Allergic rhinitis:  Nasal congestion.  No oral antihistamine.  Taking Alkeseltzer Plus.  Traveled to [place] for bike Week last week; tons of pollen. Taking Flonase daily.  Prostate cancer and urinary incontinence: followed once per year; last exam June 2015.  Review of Systems  Constitutional: Negative for fever, chills, diaphoresis, activity change, appetite change, fatigue and unexpected weight change.  HENT: Positive for congestion. Negative for dental problem, drooling, ear discharge, ear pain, facial swelling, hearing loss, mouth sores, nosebleeds, postnasal drip, rhinorrhea, sinus pressure, sneezing, sore throat, tinnitus, trouble swallowing and voice change.   Eyes: Negative for photophobia, pain, discharge, redness, itching and visual disturbance.  Respiratory: Negative for apnea, cough, choking, chest tightness, shortness of breath, wheezing and stridor.   Cardiovascular: Negative for chest pain, palpitations and leg swelling.  Gastrointestinal: Negative for nausea, vomiting, abdominal pain, diarrhea, constipation and blood in stool.  Endocrine: Negative for cold intolerance, heat intolerance, polydipsia, polyphagia and polyuria.  Genitourinary: Negative for dysuria, urgency, frequency, hematuria, flank pain, decreased urine volume, discharge, penile swelling, scrotal swelling, enuresis, difficulty urinating, genital sores, penile pain and testicular  pain.  Musculoskeletal: Negative for myalgias, back pain, joint swelling, arthralgias, gait problem, neck pain and neck stiffness.  Skin: Negative for color change, pallor, rash and wound.  Allergic/Immunologic: Negative for environmental allergies, food allergies and immunocompromised state.  Neurological: Negative for dizziness, tremors, seizures, syncope, facial asymmetry, speech difficulty, weakness, light-headedness, numbness and headaches.  Hematological: Negative for adenopathy. Does not bruise/bleed easily.  Psychiatric/Behavioral: Negative for suicidal ideas, hallucinations, behavioral problems, confusion, sleep disturbance, self-injury, dysphoric mood, decreased concentration and agitation. The patient is not nervous/anxious and is not hyperactive.     Past Medical History  Diagnosis Date  . Personal history of urinary calculi   . Hematuria, unspecified   . Other abnormal glucose   . Unspecified vitamin D deficiency   . Other B-complex deficiencies   . Personal history of malignant neoplasm of prostate   . Pure hypercholesterolemia   . Allergic rhinitis, cause unspecified   . Essential hypertension, benign   . Nonspecific abnormal electrocardiogram (ECG) (EKG)   . Impotence of organic origin   . Obstructive sleep apnea (adult) (pediatric)     CPAP  . Unspecified urinary incontinence   . Chicken pox     childhood  . Measles     childhood  . Mumps     childhood  . Cancer     Prostate cancer  . Allergy    Past Surgical History  Procedure Laterality Date  . Elbow surgery    . Prostatectomy  2005  . Urological surgery  07/2009  . Colonoscopy  01/22/2013    normal.  [facility].  Repeat 5 years.   No Known Allergies Current Outpatient Prescriptions  Medication Sig Dispense Refill  . aspirin 81 MG tablet Take 81 mg by mouth daily.    . cholecalciferol (VITAMIN D) 1000 UNITS tablet  Take 1,000 Units by mouth 2 (two) times daily.    . cloNIDine (CATAPRES) 0.1 MG tablet Take 1  tablet (0.1 mg total) by mouth 3 (three) times daily. 270 tablet 3  . Cyanocobalamin (VITAMIN B-12 PO) Take by mouth daily.    Marland Kitchen diltiazem (CARDIZEM CD) 180 MG 24 hr capsule TAKE ONE CAPSULE BY MOUTH TWICE A DAY 180 capsule 3  . ELMIRON 100 MG capsule TAKE 1 CAPSULE (100 MG TOTAL) BY MOUTH DAILY. 30 capsule 5  . enalapril (VASOTEC) 20 MG tablet Take 1 tablet (20 mg total) by mouth 2 (two) times daily. 180 tablet 3  . fluticasone (FLONASE) 50 MCG/ACT nasal spray PLACE 2 SPRAYS INTO BOTH NOSTRILS DAILY. 16 g 0  . MYRBETRIQ 50 MG TB24 Take 1 tablet by mouth daily. For urinary incontinence    . tadalafil (CIALIS) 20 MG tablet Take 1 tablet (20 mg total) by mouth daily as needed for erectile dysfunction. 8 tablet 5  . amoxicillin-clavulanate (AUGMENTIN) 875-125 MG per tablet Take 1 tablet by mouth 2 (two) times daily. 20 tablet 0  . loratadine (CLARITIN) 10 MG tablet Take 1 tablet (10 mg total) by mouth daily. 30 tablet 11  . predniSONE (DELTASONE) 20 MG tablet Two tablets daily x 5 days then one tablet daily x 5 days 15 tablet 0  . zoster vaccine live, PF, (ZOSTAVAX) 46568 UNT/0.65ML injection Inject 19,400 Units into the skin once. 1 each 0   No current facility-administered medications for this visit.       Objective:    BP 164/90 mmHg  Pulse 73  Temp(Src) 98.3 F (36.8 C) (Oral)  Resp 16  Ht 5\' 10"  (1.778 m)  Wt 255 lb 9.6 oz (115.939 kg)  BMI 36.67 kg/m2  SpO2 98% Physical Exam  Constitutional: He is oriented to person, place, and time. He appears well-developed and well-nourished. No distress.  HENT:  Head: Normocephalic and atraumatic.  Right Ear: External ear normal.  Left Ear: External ear normal.  Nose: Nose normal.  Mouth/Throat: Oropharynx is clear and moist.  Eyes: Conjunctivae and EOM are normal. Pupils are equal, round, and reactive to light.  Neck: Normal range of motion. Neck supple. Carotid bruit is not present. No thyromegaly present.  Cardiovascular: Normal rate,  regular rhythm, normal heart sounds and intact distal pulses.  Exam reveals no gallop and no friction rub.   No murmur heard. Pulmonary/Chest: Effort normal and breath sounds normal. He has no wheezes. He has no rales.  Abdominal: Soft. Bowel sounds are normal. He exhibits no distension and no mass. There is no tenderness. There is no rebound and no guarding.  Musculoskeletal:       Right shoulder: Normal.       Left shoulder: Normal.       Cervical back: Normal.  Lymphadenopathy:    He has no cervical adenopathy.  Neurological: He is alert and oriented to person, place, and time. He has normal reflexes. No cranial nerve deficit. He exhibits normal muscle tone. Coordination normal.  Skin: Skin is warm and dry. No rash noted. He is not diaphoretic.  Psychiatric: He has a normal mood and affect. His behavior is normal. Judgment and thought content normal.        Assessment & Plan:   1. Routine physical examination   2. Essential hypertension, benign   3. Glucose intolerance (impaired glucose tolerance)   4. Screening, lipid   5. Allergic rhinitis due to pollen     1. Complete Physical  Examination: anticipatory guidance --- weight loss, exercise.  Colonoscopy UTD.  Immunizations UTD; to receive Zostavax in upcoming year.   2.  HTN: controlled home readings; obtain labs, u/a.  Continue current medications. 3.  Glucose intolerance: stable; obtain labs. 4.  Screening lipid: obtain FLP. 5.  Allergic Rhinitis: uncontrolled; continue Flonase; add Claritin 10mg  daily.   Meds ordered this encounter  Medications  . loratadine (CLARITIN) 10 MG tablet    Sig: Take 1 tablet (10 mg total) by mouth daily.    Dispense:  30 tablet    Refill:  11    Return in about 4 months (around 03/07/2015) for recheck high blood pressure.     Daniqua Campoy Elayne Guerin, M.D. [facility] [address] [place], [place]  [address] [phone] phone [phone] fax

## 2014-11-05 NOTE — Patient Instructions (Signed)

## 2014-11-13 ENCOUNTER — Telehealth: Payer: Self-pay

## 2014-11-13 MED ORDER — PREDNISONE 20 MG PO TABS: ORAL_TABLET | ORAL | Status: DC

## 2014-11-13 MED ORDER — AMOXICILLIN-POT CLAVULANATE 875-125 MG PO TABS: 1.0000 | ORAL_TABLET | Freq: Two times a day (BID) | ORAL | Status: DC

## 2014-11-13 NOTE — Telephone Encounter (Signed)
Pt needs to RTC? Please advise. Spoke with pt, he feels like he has a chest cold, with a lot of coughing at night. No fever, chills, and his chest feels tight and is unable to get the mucus up. He is taking mucinex but feels it is not breaking the stuff in his chest up.

## 2014-11-13 NOTE — Telephone Encounter (Signed)
Pt states he have taken all his allergy medicine and now have a chest cold. Would like to have something called in without having to come back in. Please call [phone]     [facility] [place]

## 2014-11-13 NOTE — Telephone Encounter (Signed)
Please fax order to [phone].

## 2014-11-13 NOTE — Telephone Encounter (Signed)
If he is having SOB, he needs to return for evaluation.  If not, I have sent in Augmentin and Prednisone to pharmacy.  He should continue to take Claritin, Flonase, and Mucinex.

## 2014-11-13 NOTE — Telephone Encounter (Signed)
Faxed

## 2014-11-13 NOTE — Telephone Encounter (Signed)
Spoke with pt, advisedmessage from Dr. Tamala Julian. Pt understood.

## 2014-11-17 MED ORDER — DILTIAZEM HCL ER COATED BEADS 180 MG PO CP24: 180.0000 mg | ORAL_CAPSULE | Freq: Two times a day (BID) | ORAL | Status: DC

## 2014-11-17 MED ORDER — CLONIDINE HCL 0.1 MG PO TABS: 0.1000 mg | ORAL_TABLET | Freq: Three times a day (TID) | ORAL | Status: DC

## 2014-11-17 MED ORDER — ENALAPRIL MALEATE 20 MG PO TABS: 20.0000 mg | ORAL_TABLET | Freq: Two times a day (BID) | ORAL | Status: DC

## 2014-11-17 MED ORDER — FLUTICASONE PROPIONATE 50 MCG/ACT NA SUSP: NASAL | Status: DC

## 2014-11-26 ENCOUNTER — Telehealth: Payer: Self-pay

## 2014-11-26 NOTE — Telephone Encounter (Signed)
Can we suggest something over the counter. Pt should come in. Please advise.

## 2014-11-26 NOTE — Telephone Encounter (Signed)
Pt can take mucinex for the congestion. He should not take a decongestant as he has history of high blood pressure. Yes, he should come in to be seen as Dr Tamala Julian only saw him for a complete physical last.

## 2014-11-26 NOTE — Telephone Encounter (Signed)
Gave pt message.

## 2014-11-26 NOTE — Telephone Encounter (Signed)
Pt called to request that Dr. Tamala Julian send in something for chest congestion. He does not want to RTC.

## 2014-11-27 ENCOUNTER — Encounter: Payer: Self-pay | Admitting: Physician Assistant

## 2014-11-27 ENCOUNTER — Ambulatory Visit (INDEPENDENT_AMBULATORY_CARE_PROVIDER_SITE_OTHER): Payer: 59 | Admitting: Family Medicine

## 2014-11-27 ENCOUNTER — Ambulatory Visit (INDEPENDENT_AMBULATORY_CARE_PROVIDER_SITE_OTHER): Payer: 59

## 2014-11-27 VITALS — BP 142/90 | HR 69 | Temp 98.2°F | Resp 18 | Ht 69.75 in | Wt 227.0 lb

## 2014-11-27 DIAGNOSIS — J189 Pneumonia, unspecified organism: Secondary | ICD-10-CM | POA: Diagnosis not present

## 2014-11-27 DIAGNOSIS — R05 Cough: Secondary | ICD-10-CM

## 2014-11-27 DIAGNOSIS — J309 Allergic rhinitis, unspecified: Secondary | ICD-10-CM

## 2014-11-27 DIAGNOSIS — R059 Cough, unspecified: Secondary | ICD-10-CM

## 2014-11-27 LAB — POCT CBC
GRANULOCYTE PERCENT: 67.6 % (ref 37–80)
HEMATOCRIT: 41.5 % — AB (ref 43.5–53.7)
Hemoglobin: 13.2 g/dL — AB (ref 14.1–18.1)
Lymph, poc: 1.3 (ref 0.6–3.4)
MCH, POC: 27.4 pg (ref 27–31.2)
MCHC: 31.9 g/dL (ref 31.8–35.4)
MCV: 85.8 fL (ref 80–97)
MID (cbc): 0.4 (ref 0–0.9)
MPV: 7.2 fL (ref 0–99.8)
POC GRANULOCYTE: 3.7 (ref 2–6.9)
POC LYMPH PERCENT: 24.4 %L (ref 10–50)
POC MID %: 8 %M (ref 0–12)
Platelet Count, POC: 152 10*3/uL (ref 142–424)
RBC: 4.84 M/uL (ref 4.69–6.13)
RDW, POC: 15.2 %
WBC: 5.5 10*3/uL (ref 4.6–10.2)

## 2014-11-27 MED ORDER — ALBUTEROL SULFATE HFA 108 (90 BASE) MCG/ACT IN AERS: 2.0000 | INHALATION_SPRAY | RESPIRATORY_TRACT | Status: DC | PRN

## 2014-11-27 MED ORDER — CEFUROXIME AXETIL 500 MG PO TABS: 500.0000 mg | ORAL_TABLET | Freq: Two times a day (BID) | ORAL | Status: AC

## 2014-11-27 NOTE — Progress Notes (Signed)
Subjective:    Patient ID: Juan Chavez, male    DOB: 17-Feb-1951, 64 y.o.   MRN: [id number]  HPI  This is a 64 year old male who is presenting with cough x 3 weeks. Dr. Tamala Julian saw him at the start of symptoms. She thought they were due to allergies. She prescribed claritin and flonase. He called a few days later stating symptoms were worsening. She prescribed augmentin and prednisone. States symptoms improved after augmentin and prednisone but never went away. Symptoms started to worsen again 5-6 days ago. Cough is productive. Throat is mildly sore. Felt feverish all 3 days ago and again last night but none today. Delsym and mucinex helping some. Denies nasal congestion, otalgia, SOB, wheezing. Not a smoker. No lung disease. Takes flonase daily and claritin prn for allergic rhinitis. He works with [employer] - he is outside often.  Review of Systems  Constitutional: Positive for fever. Negative for chills.  HENT: Positive for sore throat. Negative for congestion, ear pain and sinus pressure.   Eyes: Negative for redness.  Respiratory: Positive for cough. Negative for shortness of breath and wheezing.   Gastrointestinal: Negative for nausea and vomiting.  Allergic/Immunologic: Positive for environmental allergies.  Hematological: Negative for adenopathy.  Psychiatric/Behavioral: Negative for sleep disturbance.    Patient Active Problem List   Diagnosis Date Noted  . Obesity (BMI 30.0-34.9) 07/03/2014  . Other abnormal glucose 11/14/2012  . Unspecified vitamin D deficiency 11/14/2012  . Allergic rhinitis 11/14/2012  . Routine general medical examination at a health care facility 08/02/2012  . Essential hypertension, benign 08/02/2012   Prior to Admission medications   Medication Sig Start Date End Date Taking? Authorizing Provider  aspirin 81 MG tablet Take 81 mg by mouth daily.   Yes Historical Provider, MD  cholecalciferol (VITAMIN D) 1000 UNITS tablet Take 1,000 Units by mouth 2  (two) times daily.   Yes Historical Provider, MD  cloNIDine (CATAPRES) 0.1 MG tablet Take 1 tablet (0.1 mg total) by mouth 3 (three) times daily. 11/17/14  Yes [name], MD  Cyanocobalamin (VITAMIN B-12 PO) Take by mouth daily.   Yes Historical Provider, MD  diltiazem (CARDIZEM CD) 180 MG 24 hr capsule Take 1 capsule (180 mg total) by mouth 2 (two) times daily. 11/17/14  Yes [name], MD  ELMIRON 100 MG capsule TAKE 1 CAPSULE (100 MG TOTAL) BY MOUTH DAILY. 09/12/13  Yes [name], MD  enalapril (VASOTEC) 20 MG tablet Take 1 tablet (20 mg total) by mouth 2 (two) times daily. 11/17/14  Yes [name], MD  fluticasone (FLONASE) 50 MCG/ACT nasal spray PLACE 2 SPRAYS INTO BOTH NOSTRILS DAILY. 11/17/14  Yes [name], MD  loratadine (CLARITIN) 10 MG tablet Take 1 tablet (10 mg total) by mouth daily. 11/05/14  Yes [name], MD  MYRBETRIQ 50 MG TB24 Take 1 tablet by mouth daily. For urinary incontinence 07/26/12  Yes Historical Provider, MD  predniSONE (DELTASONE) 20 MG tablet Two tablets daily x 5 days then one tablet daily x 5 days 11/13/14  Yes [name], MD  tadalafil (CIALIS) 20 MG tablet Take 1 tablet (20 mg total) by mouth daily as needed for erectile dysfunction. 07/02/14  Yes [name], MD   No Known Allergies  Patient's social and family history were reviewed.     Objective:   Physical Exam  Constitutional: He is oriented to person, place, and time. He appears well-developed and well-nourished. No distress.  HENT:  Head: Normocephalic and atraumatic.  Right Ear: Hearing, tympanic membrane, external ear and ear canal normal.  Left Ear: Hearing, tympanic membrane, external ear and ear canal normal.  Nose: Nose normal.  Mouth/Throat: Uvula is midline and mucous membranes are normal. Posterior oropharyngeal erythema present. No oropharyngeal exudate or posterior oropharyngeal edema.  Eyes: Conjunctivae and lids are normal. Right eye exhibits no discharge. Left  eye exhibits no discharge. No scleral icterus.  Cardiovascular: Normal rate, regular rhythm, normal heart sounds and normal pulses.   No murmur heard. Pulmonary/Chest: Effort normal. No respiratory distress. He has no wheezes. He has rhonchi (scattered). He has no rales.  Musculoskeletal: Normal range of motion.  Lymphadenopathy:       Head (right side): No submental, no submandibular and no tonsillar adenopathy present.       Head (left side): No submental, no submandibular and no tonsillar adenopathy present.    He has no cervical adenopathy.  Neurological: He is alert and oriented to person, place, and time.  Skin: Skin is warm, dry and intact. No lesion and no rash noted.  Psychiatric: He has a normal mood and affect. His speech is normal and behavior is normal. Thought content normal.   BP 142/90 mmHg  Pulse 69  Temp(Src) 98.2 F (36.8 C) (Oral)  Resp 18  Ht 5' 9.75" (1.772 m)  Wt 227 lb (102.967 kg)  BMI 32.79 kg/m2  SpO2 98%   Results for orders placed or performed in visit on 11/27/14  POCT CBC  Result Value Ref Range   WBC 5.5 4.6 - 10.2 K/uL   Lymph, poc 1.3 0.6 - 3.4   POC LYMPH PERCENT 24.4 10 - 50 %L   MID (cbc) 0.4 0 - 0.9   POC MID % 8.0 0 - 12 %M   POC Granulocyte 3.7 2 - 6.9   Granulocyte percent 67.6 37 - 80 %G   RBC 4.84 4.69 - 6.13 M/uL   Hemoglobin 13.2 (A) 14.1 - 18.1 g/dL   HCT, POC 41.5 (A) 43.5 - 53.7 %   MCV 85.8 80 - 97 fL   MCH, POC 27.4 27 - 31.2 pg   MCHC 31.9 31.8 - 35.4 g/dL   RDW, POC 15.2 %   Platelet Count, POC 152.0 142 - 424 K/uL   MPV 7.2 0 - 99.8 fL   [facility] reading (PRIMARY) by  Dr. Leward Quan: patchy infiltrate right lower lung base    Assessment & Plan:  1. Cough 2. Acute bronchitis, unspecified organism 3. Allergic rhinitis Chest radiograph showed possible right lower lobe pneumonia. CBC wnl. Ceftin and albuterol for treatment. Advised he take claritin and flonase daily for allergies. If not getting better in 10 days will  consider referral to pulmonology and/or allergist.  - DG Chest 2 View; Future - POCT CBC - cefUROXime (CEFTIN) 500 MG tablet; Take 1 tablet (500 mg total) by mouth 2 (two) times daily with a meal.  Dispense: 20 tablet; Refill: 0 - albuterol (PROVENTIL HFA;VENTOLIN HFA) 108 (90 BASE) MCG/ACT inhaler; Inhale 2 puffs into the lungs every 4 (four) hours as needed for wheezing or shortness of breath (cough, shortness of breath or wheezing.).  Dispense: 1 Inhaler; Refill: 1   Dreamer Carillo V. Drenda Freeze, MHS [facility]  11/27/2014

## 2014-11-27 NOTE — Patient Instructions (Signed)
Take antibiotic twice a day for 10 days. Take claritin and flonase every day. Stop the delsym and alka selzter. You may continue the mucinex. If you are not getting better after the antibiotic let me know - we may need to refer you to a specialist.

## 2014-12-02 ENCOUNTER — Encounter: Payer: Self-pay | Admitting: Family Medicine

## 2014-12-02 NOTE — Progress Notes (Signed)
This pt encounter was conducted by Bennett Scrape, PA-C. Pt was discussed with me and CXR reviewed w/ PA-C. I agree w/ her assessment and management plan.  Barton Fanny, MD Family Physician [facility]

## 2014-12-10 ENCOUNTER — Encounter: Payer: Self-pay | Admitting: Physician Assistant

## 2014-12-10 ENCOUNTER — Ambulatory Visit (INDEPENDENT_AMBULATORY_CARE_PROVIDER_SITE_OTHER): Payer: 59 | Admitting: Physician Assistant

## 2014-12-10 VITALS — BP 146/84 | HR 75 | Temp 98.0°F | Resp 16 | Ht 69.5 in | Wt 231.6 lb

## 2014-12-10 DIAGNOSIS — J189 Pneumonia, unspecified organism: Secondary | ICD-10-CM | POA: Diagnosis not present

## 2014-12-10 MED ORDER — BECLOMETHASONE DIPROPIONATE 40 MCG/ACT IN AERS: 1.0000 | INHALATION_SPRAY | Freq: Two times a day (BID) | RESPIRATORY_TRACT | Status: DC

## 2014-12-10 NOTE — Progress Notes (Signed)
Subjective:    Patient ID: Juan Chavez, male    DOB: [birth year], 64 y.o.   MRN: [id number]  HPI  This is a 64 year old male who is presenting for recheck. He was initially seen 1 month ago for cough and other upper respiratory viral symptoms. He had worsening of his cough and Dr. Tamala Julian called in augmentin and prednisone. His symptoms initially got better but worsened after finishing both. I saw him 13 days ago. Chest radiograph showed possible right lower lung infiltrate. I put him on ceftin and albuterol. He finished abx 3 days ago and has been useing albuterol at night. He reports he is much improved from last visit and states he continues to notice improvement each day. He is still having night time cough that is minimally productive. No wheezing or SOB.   Review of Systems  Constitutional: Negative for fever and chills.  HENT: Negative for congestion, ear pain, sinus pressure and sore throat.   Eyes: Negative for redness.  Respiratory: Positive for cough. Negative for shortness of breath and wheezing.   Gastrointestinal: Negative for nausea, vomiting, abdominal pain and diarrhea.  Skin: Negative for rash.  Allergic/Immunologic: Positive for environmental allergies.  Hematological: Negative for adenopathy.  Psychiatric/Behavioral: Positive for sleep disturbance.    Patient Active Problem List   Diagnosis Date Noted  . Obesity (BMI 30.0-34.9) 07/03/2014  . Other abnormal glucose 11/14/2012  . Unspecified vitamin D deficiency 11/14/2012  . Allergic rhinitis 11/14/2012  . Routine general medical examination at a health care facility 08/02/2012  . Essential hypertension, benign 08/02/2012    Prior to Admission medications   Medication Sig Start Date End Date Taking? Authorizing Provider  albuterol (PROVENTIL HFA;VENTOLIN HFA) 108 (90 BASE) MCG/ACT inhaler Inhale 2 puffs into the lungs every 4 (four) hours as needed for wheezing or shortness of breath (cough, shortness of breath or  wheezing.). 11/27/14  Yes Bennett Scrape V, PA-C  aspirin 81 MG tablet Take 81 mg by mouth daily.   Yes Historical Provider, MD  cholecalciferol (VITAMIN D) 1000 UNITS tablet Take 1,000 Units by mouth 2 (two) times daily.   Yes Historical Provider, MD  cloNIDine (CATAPRES) 0.1 MG tablet Take 1 tablet (0.1 mg total) by mouth 3 (three) times daily. 11/17/14  Yes [name], MD  Cyanocobalamin (VITAMIN B-12 PO) Take by mouth daily.   Yes Historical Provider, MD  diltiazem (CARDIZEM CD) 180 MG 24 hr capsule Take 1 capsule (180 mg total) by mouth 2 (two) times daily. 11/17/14  Yes [name], MD  ELMIRON 100 MG capsule TAKE 1 CAPSULE (100 MG TOTAL) BY MOUTH DAILY. 09/12/13  Yes [name], MD  enalapril (VASOTEC) 20 MG tablet Take 1 tablet (20 mg total) by mouth 2 (two) times daily. 11/17/14  Yes [name], MD  fluticasone (FLONASE) 50 MCG/ACT nasal spray PLACE 2 SPRAYS INTO BOTH NOSTRILS DAILY. 11/17/14  Yes [name], MD  loratadine (CLARITIN) 10 MG tablet Take 1 tablet (10 mg total) by mouth daily. 11/05/14  Yes [name], MD  MYRBETRIQ 50 MG TB24 Take 1 tablet by mouth daily. For urinary incontinence 07/26/12  Yes Historical Provider, MD  tadalafil (CIALIS) 20 MG tablet Take 1 tablet (20 mg total) by mouth daily as needed for erectile dysfunction. 07/02/14  Yes [name], MD          Not on File  Patient's social and family history were reviewed.     Objective:  Physical Exam  Constitutional: He is oriented to person, place, and time. He appears well-developed and well-nourished. No distress.  HENT:  Head: Normocephalic and atraumatic.  Right Ear: Hearing, tympanic membrane, external ear and ear canal normal.  Left Ear: Hearing, tympanic membrane, external ear and ear canal normal.  Nose: Nose normal. Right sinus exhibits no maxillary sinus tenderness and no frontal sinus tenderness. Left sinus exhibits no maxillary sinus tenderness and no frontal sinus tenderness.    Mouth/Throat: Uvula is midline, oropharynx is clear and moist and mucous membranes are normal.  Eyes: Conjunctivae and lids are normal. Right eye exhibits no discharge. Left eye exhibits no discharge. No scleral icterus.  Cardiovascular: Normal rate, regular rhythm, normal heart sounds and normal pulses.   No murmur heard. Pulmonary/Chest: Effort normal. No respiratory distress. He has no wheezes. He has rhonchi (scattered). He has no rales.  Musculoskeletal: Normal range of motion.  Lymphadenopathy:       Head (right side): No submental, no submandibular and no tonsillar adenopathy present.       Head (left side): No submental, no submandibular and no tonsillar adenopathy present.    He has no cervical adenopathy.  Neurological: He is alert and oriented to person, place, and time.  Skin: Skin is warm, dry and intact. No lesion and no rash noted.  Psychiatric: He has a normal mood and affect. His speech is normal and behavior is normal. Thought content normal.   BP 146/84 mmHg  Pulse 75  Temp(Src) 98 F (36.7 C) (Oral)  Resp 16  Ht 5' 9.5" (1.765 m)  Wt 231 lb 9.6 oz (105.053 kg)  BMI 33.72 kg/m2  SpO2 98%     Assessment & Plan:  1. CAP (community acquired pneumonia) Pt was treated for CAP twice with augmentin and prednisone first and then ceftin and albuterol. Finished most recent abx 3 days ago. He reports he is doing much better but cough persists, esp at night. Reassured pt it is normal for cough to persist and I am encouraged that his symptoms have improved so much. He will continue daily flonase and claritin for possible allergic involvement. Stop mucinex. Continue albuterol prn. Start qvar BID x 2 weeks to decrease inflammation. He is agreeable. If not getting better in 2-3 weeks he will let me know.  - beclomethasone (QVAR) 40 MCG/ACT inhaler; Inhale 1 puff into the lungs 2 (two) times daily.  Dispense: 1 Inhaler; Refill: 0   Benjaman Pott. Drenda Freeze, MHS [facility]  12/10/2014

## 2014-12-10 NOTE — Patient Instructions (Signed)
Continue daily flonase and claritin. Continue albuterol as needed. Use qvar steroid inhaler twice a day for 2 weeks. Ask pharmacist to show you how to use it. If not better in 2-4 weeks, let me know.

## 2015-03-04 ENCOUNTER — Ambulatory Visit: Payer: 59 | Admitting: Family Medicine

## 2015-03-18 ENCOUNTER — Ambulatory Visit: Payer: 59 | Admitting: Family Medicine

## 2015-04-08 ENCOUNTER — Ambulatory Visit (INDEPENDENT_AMBULATORY_CARE_PROVIDER_SITE_OTHER): Payer: 59

## 2015-04-08 ENCOUNTER — Ambulatory Visit (INDEPENDENT_AMBULATORY_CARE_PROVIDER_SITE_OTHER): Payer: 59 | Admitting: Family Medicine

## 2015-04-08 ENCOUNTER — Encounter: Payer: Self-pay | Admitting: Family Medicine

## 2015-04-08 VITALS — BP 161/90 | HR 65 | Temp 98.6°F | Resp 16 | Ht 70.0 in | Wt 231.2 lb

## 2015-04-08 DIAGNOSIS — R05 Cough: Secondary | ICD-10-CM | POA: Diagnosis not present

## 2015-04-08 DIAGNOSIS — J301 Allergic rhinitis due to pollen: Secondary | ICD-10-CM

## 2015-04-08 DIAGNOSIS — R059 Cough, unspecified: Secondary | ICD-10-CM

## 2015-04-08 DIAGNOSIS — I10 Essential (primary) hypertension: Secondary | ICD-10-CM

## 2015-04-08 DIAGNOSIS — R7302 Impaired glucose tolerance (oral): Secondary | ICD-10-CM

## 2015-04-08 DIAGNOSIS — Z23 Encounter for immunization: Secondary | ICD-10-CM | POA: Diagnosis not present

## 2015-04-08 LAB — COMPREHENSIVE METABOLIC PANEL
ALBUMIN: 4 g/dL (ref 3.6–5.1)
ALK PHOS: 85 U/L (ref 40–115)
ALT: 26 U/L (ref 9–46)
AST: 23 U/L (ref 10–35)
BUN: 13 mg/dL (ref 7–25)
CALCIUM: 9 mg/dL (ref 8.6–10.3)
CHLORIDE: 106 mmol/L (ref 98–110)
CO2: 26 mmol/L (ref 20–31)
Creat: 0.86 mg/dL (ref 0.70–1.25)
Glucose, Bld: 95 mg/dL (ref 65–99)
POTASSIUM: 3.8 mmol/L (ref 3.5–5.3)
Sodium: 139 mmol/L (ref 135–146)
TOTAL PROTEIN: 7.1 g/dL (ref 6.1–8.1)
Total Bilirubin: 0.6 mg/dL (ref 0.2–1.2)

## 2015-04-08 LAB — CBC WITH DIFFERENTIAL/PLATELET
Basophils Absolute: 0.1 10*3/uL (ref 0.0–0.1)
Basophils Relative: 1 % (ref 0–1)
EOS ABS: 0.1 10*3/uL (ref 0.0–0.7)
Eosinophils Relative: 2 % (ref 0–5)
HEMATOCRIT: 38.5 % — AB (ref 39.0–52.0)
Hemoglobin: 13.5 g/dL (ref 13.0–17.0)
LYMPHS ABS: 1.7 10*3/uL (ref 0.7–4.0)
LYMPHS PCT: 27 % (ref 12–46)
MCH: 29.3 pg (ref 26.0–34.0)
MCHC: 35.1 g/dL (ref 30.0–36.0)
MCV: 83.5 fL (ref 78.0–100.0)
MONOS PCT: 8 % (ref 3–12)
MPV: 9.9 fL (ref 8.6–12.4)
Monocytes Absolute: 0.5 10*3/uL (ref 0.1–1.0)
NEUTROS PCT: 62 % (ref 43–77)
Neutro Abs: 4 10*3/uL (ref 1.7–7.7)
PLATELETS: 163 10*3/uL (ref 150–400)
RBC: 4.61 MIL/uL (ref 4.22–5.81)
RDW: 14.2 % (ref 11.5–15.5)
WBC: 6.4 10*3/uL (ref 4.0–10.5)

## 2015-04-08 LAB — HEMOGLOBIN A1C
Hgb A1c MFr Bld: 6.5 % — ABNORMAL HIGH (ref <5.7)
Mean Plasma Glucose: 140 mg/dL — ABNORMAL HIGH (ref <117)

## 2015-04-08 MED ORDER — ZOSTER VACCINE LIVE 19400 UNT/0.65ML ~~LOC~~ SOLR
0.6500 mL | Freq: Once | SUBCUTANEOUS | Status: DC
Start: 2015-04-08 — End: 2015-12-24

## 2015-04-08 NOTE — Progress Notes (Signed)
Subjective:    Patient ID: Juan Chavez, male    DOB: Dec 09, 1950, 64 y.o.   MRN: [id number]  04/08/2015  Hypertension   HPI This 64 y.o. male presents for four month follow-up;  1.HTN:  Patient reports good compliance with medication, good tolerance to medication, and good symptom control.  Home BPs running 126/79-84.    2.  Glucose intolerance:  Exercising still daily.  Has gained weight in the past year.   3.  Allergic Rhinitis: taking Claritin and Flonase. +congestion; +rhinorrhea; +sneezing.    4.  Acute sinusitis with bronchospasm: due for repeat CXR after recent visit.    5. Abscess: taking Doxycycycline for abscess.  Mayford Knife of dermatology performed I&D; follow-up tomorrow.   6.  Prostate cancer: one year ago in 06/15/2014.  Taking Myrbetiq daily.   Review of Systems  Constitutional: Negative for fever, chills, diaphoresis, activity change, appetite change and fatigue.  HENT: Positive for congestion, rhinorrhea and sneezing. Negative for ear pain and sore throat.   Respiratory: Negative for cough and shortness of breath.   Cardiovascular: Negative for chest pain, palpitations and leg swelling.  Gastrointestinal: Negative for nausea, vomiting, abdominal pain and diarrhea.  Endocrine: Negative for cold intolerance, heat intolerance, polydipsia, polyphagia and polyuria.  Skin: Positive for color change. Negative for rash and wound.  Neurological: Negative for dizziness, tremors, seizures, syncope, facial asymmetry, speech difficulty, weakness, light-headedness, numbness and headaches.  Psychiatric/Behavioral: Negative for sleep disturbance and dysphoric mood. The patient is not nervous/anxious.     Past Medical History  Diagnosis Date  . Personal history of urinary calculi   . Hematuria, unspecified   . Other abnormal glucose   . Unspecified vitamin D deficiency   . Other B-complex deficiencies   . Personal history of malignant neoplasm of prostate   . Pure  hypercholesterolemia   . Allergic rhinitis, cause unspecified   . Essential hypertension, benign   . Nonspecific abnormal electrocardiogram (ECG) (EKG)   . Impotence of organic origin   . Obstructive sleep apnea (adult) (pediatric)     CPAP  . Unspecified urinary incontinence   . Chicken pox     childhood  . Measles     childhood  . Mumps     childhood  . Cancer     Prostate cancer  . Allergy    Past Surgical History  Procedure Laterality Date  . Elbow surgery    . Prostatectomy  2005  . Urological surgery  07/2009  . Colonoscopy  01/22/2013    normal.  [facility].  Repeat 5 years.   Not on File Current Outpatient Prescriptions  Medication Sig Dispense Refill  . aspirin 81 MG tablet Take 81 mg by mouth daily.    . cholecalciferol (VITAMIN D) 1000 UNITS tablet Take 1,000 Units by mouth 2 (two) times daily.    . cloNIDine (CATAPRES) 0.1 MG tablet Take 1 tablet (0.1 mg total) by mouth 3 (three) times daily. 270 tablet 3  . Cyanocobalamin (VITAMIN B-12 PO) Take by mouth daily.    Marland Kitchen diltiazem (CARDIZEM CD) 180 MG 24 hr capsule Take 1 capsule (180 mg total) by mouth 2 (two) times daily. 180 capsule 3  . ELMIRON 100 MG capsule TAKE 1 CAPSULE (100 MG TOTAL) BY MOUTH DAILY. 30 capsule 5  . enalapril (VASOTEC) 20 MG tablet Take 1 tablet (20 mg total) by mouth 2 (two) times daily. 180 tablet 3  . fluticasone (FLONASE) 50 MCG/ACT nasal spray PLACE 2 SPRAYS INTO BOTH NOSTRILS  DAILY. 16 g 11  . loratadine (CLARITIN) 10 MG tablet Take 1 tablet (10 mg total) by mouth daily. 30 tablet 11  . tadalafil (CIALIS) 20 MG tablet Take 1 tablet (20 mg total) by mouth daily as needed for erectile dysfunction. 8 tablet 5  . MYRBETRIQ 50 MG TB24 Take 1 tablet by mouth daily. For urinary incontinence    . zoster vaccine live, PF, (ZOSTAVAX) 38182 UNT/0.65ML injection Inject 19,400 Units into the skin once. 1 each 0   No current facility-administered medications for this visit.   Social History   Social  History  . Marital Status: Married    Spouse Name: N/A  . Number of Children: 2  . Years of Education: N/A   Occupational History  . [employer]    Social History Main Topics  . Smoking status: Never Smoker   . Smokeless tobacco: Never Used  . Alcohol Use: Yes     Comment: occasional drinks beer 2 per week  . Drug Use: No  . Sexual Activity:    Partners: Female   Other Topics Concern  . Not on file   Social History Narrative   Marital status:  Married x 20 years happily married.      Children:  2 children; 4 grandchildren.      Lives: with wife.  Children local.      Employment: [employer] x 44 years.  Plans to retire unknown.       Tobacco:  Never.      Alcohol: [place] Wine 0-1 glass per day.      Drugs: none      Exercise: moderate, walking and running daily for 15 minutes.      Seatbelt:  Always uses seat belts.       Smoke alarm and carbon monoxide detector in the home.      Caffeine use: none.       Guns:  1 gun loaded; others unloaded.         Family History  Problem Relation Age of Onset  . Heart disease Father 76    AMI age 55; CM.  Marland Kitchen Hypertension Father   . Mental illness Sister     home in [place]  . Alcohol abuse Sister   . Hypertension Mother   . Heart disease Mother     AMI years ago; no CABG/stents.  . Arthritis Mother   . Heart disease Brother 44    AMI; CM        Objective:    BP 161/90 mmHg  Pulse 65  Temp(Src) 98.6 F (37 C) (Oral)  Resp 16  Ht $R'5\' 10"'Np$  (1.778 m)  Wt 231 lb 3.2 oz (104.872 kg)  BMI 33.17 kg/m2  SpO2 98% Physical Exam  Constitutional: He is oriented to person, place, and time. He appears well-developed and well-nourished. No distress.  HENT:  Head: Normocephalic and atraumatic.  Right Ear: External ear normal.  Left Ear: External ear normal.  Nose: Nose normal.  Mouth/Throat: Oropharynx is clear and moist.  Eyes: Conjunctivae and EOM are normal. Pupils are equal, round, and reactive to light.    Neck: Normal range of motion. Neck supple. Carotid bruit is not present. No thyromegaly present.  Cardiovascular: Normal rate, regular rhythm, normal heart sounds and intact distal pulses.  Exam reveals no gallop and no friction rub.   No murmur heard. Pulmonary/Chest: Effort normal and breath sounds normal. He has no wheezes. He has no rales.  Abdominal: Soft.  Bowel sounds are normal. He exhibits no distension and no mass. There is no tenderness. There is no rebound and no guarding.  Lymphadenopathy:    He has no cervical adenopathy.  Neurological: He is alert and oriented to person, place, and time. No cranial nerve deficit.  Skin: Skin is warm and dry. No rash noted. He is not diaphoretic.  Psychiatric: He has a normal mood and affect. His behavior is normal.  Nursing note and vitals reviewed.  Results for orders placed or performed in visit on 04/08/15  CBC with Differential/Platelet  Result Value Ref Range   WBC 6.4 4.0 - 10.5 K/uL   RBC 4.61 4.22 - 5.81 MIL/uL   Hemoglobin 13.5 13.0 - 17.0 g/dL   HCT 38.5 (L) 39.0 - 52.0 %   MCV 83.5 78.0 - 100.0 fL   MCH 29.3 26.0 - 34.0 pg   MCHC 35.1 30.0 - 36.0 g/dL   RDW 14.2 11.5 - 15.5 %   Platelets 163 150 - 400 K/uL   MPV 9.9 8.6 - 12.4 fL   Neutrophils Relative % 62 43 - 77 %   Neutro Abs 4.0 1.7 - 7.7 K/uL   Lymphocytes Relative 27 12 - 46 %   Lymphs Abs 1.7 0.7 - 4.0 K/uL   Monocytes Relative 8 3 - 12 %   Monocytes Absolute 0.5 0.1 - 1.0 K/uL   Eosinophils Relative 2 0 - 5 %   Eosinophils Absolute 0.1 0.0 - 0.7 K/uL   Basophils Relative 1 0 - 1 %   Basophils Absolute 0.1 0.0 - 0.1 K/uL   Smear Review Criteria for review not met   Comprehensive metabolic panel  Result Value Ref Range   Sodium 139 135 - 146 mmol/L   Potassium 3.8 3.5 - 5.3 mmol/L   Chloride 106 98 - 110 mmol/L   CO2 26 20 - 31 mmol/L   Glucose, Bld 95 65 - 99 mg/dL   BUN 13 7 - 25 mg/dL   Creat 0.86 0.70 - 1.25 mg/dL   Total Bilirubin 0.6 0.2 - 1.2 mg/dL    Alkaline Phosphatase 85 40 - 115 U/L   AST 23 10 - 35 U/L   ALT 26 9 - 46 U/L   Total Protein 7.1 6.1 - 8.1 g/dL   Albumin 4.0 3.6 - 5.1 g/dL   Calcium 9.0 8.6 - 10.3 mg/dL  Hemoglobin A1c  Result Value Ref Range   Hgb A1c MFr Bld 6.5 (H) <5.7 %   Mean Plasma Glucose 140 (H) <117 mg/dL   [facility] reading (PRIMARY) by  Dr. Tamala Julian. CXR: NAD      Assessment & Plan:   1. Essential hypertension, benign   2. Allergic rhinitis due to pollen   3. Glucose intolerance (impaired glucose tolerance)   4. Need for shingles vaccine   5. Cough     1. HTN: uncontrolled; increase Clonidine 0.1mg  to tid; continue other medications at current dose. 2. Allergic Rhinitis: worsening recently and restarted Claritin and Flonase. 3.  Glucose intolerance: stable yet recent weight gain; obtain labs; recommend weight loss, exercise, low-sugar food choices. 4.  Cough: improved/resolved; concern of nipple shadow on CXR in 11/2014; repeat CXR is normal. 5.  Zostavax rx provided again.   Orders Placed This Encounter  Procedures  . DG Chest 2 View    Standing Status: Future     Number of Occurrences: 1     Standing Expiration Date: 04/07/2016    Order Specific Question:  Reason  for Exam (SYMPTOM  OR DIAGNOSIS REQUIRED)    Answer:  follow-up CXR in 11/2014; cough    Order Specific Question:  Preferred imaging location?    Answer:  External  . CBC with Differential/Platelet  . Comprehensive metabolic panel  . Hemoglobin A1c    Meds ordered this encounter  Medications  . zoster vaccine live, PF, (ZOSTAVAX) 42706 UNT/0.65ML injection    Sig: Inject 19,400 Units into the skin once.    Dispense:  1 each    Refill:  0    Return in about 4 months (around 08/08/2015) for recheck.   Taber Sweetser Elayne Guerin, M.D. [facility] [address] [place], [place]  [address] [phone] phone [phone] fax

## 2015-04-08 NOTE — Patient Instructions (Signed)
INCREASE CLONIDINE 0.1MG  TO THREE TIMES DAILY.

## 2015-08-05 ENCOUNTER — Other Ambulatory Visit: Payer: Self-pay | Admitting: Family Medicine

## 2015-09-02 ENCOUNTER — Encounter: Payer: Self-pay | Admitting: Family Medicine

## 2015-09-02 ENCOUNTER — Ambulatory Visit (INDEPENDENT_AMBULATORY_CARE_PROVIDER_SITE_OTHER): Payer: 59 | Admitting: Family Medicine

## 2015-09-02 VITALS — BP 168/98 | HR 69 | Temp 98.9°F | Resp 16 | Ht 67.0 in | Wt 234.2 lb

## 2015-09-02 DIAGNOSIS — Z1159 Encounter for screening for other viral diseases: Secondary | ICD-10-CM | POA: Diagnosis not present

## 2015-09-02 DIAGNOSIS — E119 Type 2 diabetes mellitus without complications: Secondary | ICD-10-CM | POA: Diagnosis not present

## 2015-09-02 DIAGNOSIS — Z114 Encounter for screening for human immunodeficiency virus [HIV]: Secondary | ICD-10-CM

## 2015-09-02 DIAGNOSIS — J302 Other seasonal allergic rhinitis: Secondary | ICD-10-CM | POA: Diagnosis not present

## 2015-09-02 DIAGNOSIS — I10 Essential (primary) hypertension: Secondary | ICD-10-CM

## 2015-09-02 DIAGNOSIS — Z23 Encounter for immunization: Secondary | ICD-10-CM | POA: Diagnosis not present

## 2015-09-02 LAB — CBC WITH DIFFERENTIAL/PLATELET
BASOS ABS: 0.1 10*3/uL (ref 0.0–0.1)
BASOS PCT: 1 % (ref 0–1)
EOS ABS: 0.1 10*3/uL (ref 0.0–0.7)
Eosinophils Relative: 2 % (ref 0–5)
HCT: 38.2 % — ABNORMAL LOW (ref 39.0–52.0)
Hemoglobin: 13 g/dL (ref 13.0–17.0)
Lymphocytes Relative: 28 % (ref 12–46)
Lymphs Abs: 1.7 10*3/uL (ref 0.7–4.0)
MCH: 28.5 pg (ref 26.0–34.0)
MCHC: 34 g/dL (ref 30.0–36.0)
MCV: 83.8 fL (ref 78.0–100.0)
MPV: 10.3 fL (ref 8.6–12.4)
Monocytes Absolute: 0.4 10*3/uL (ref 0.1–1.0)
Monocytes Relative: 6 % (ref 3–12)
NEUTROS PCT: 63 % (ref 43–77)
Neutro Abs: 3.8 10*3/uL (ref 1.7–7.7)
Platelets: 160 10*3/uL (ref 150–400)
RBC: 4.56 MIL/uL (ref 4.22–5.81)
RDW: 14 % (ref 11.5–15.5)
WBC: 6 10*3/uL (ref 4.0–10.5)

## 2015-09-02 LAB — COMPREHENSIVE METABOLIC PANEL
ALT: 25 U/L (ref 9–46)
AST: 18 U/L (ref 10–35)
Albumin: 3.9 g/dL (ref 3.6–5.1)
Alkaline Phosphatase: 67 U/L (ref 40–115)
BILIRUBIN TOTAL: 0.5 mg/dL (ref 0.2–1.2)
BUN: 15 mg/dL (ref 7–25)
CHLORIDE: 106 mmol/L (ref 98–110)
CO2: 27 mmol/L (ref 20–31)
CREATININE: 0.88 mg/dL (ref 0.70–1.25)
Calcium: 8.8 mg/dL (ref 8.6–10.3)
GLUCOSE: 104 mg/dL — AB (ref 65–99)
Potassium: 3.6 mmol/L (ref 3.5–5.3)
SODIUM: 141 mmol/L (ref 135–146)
Total Protein: 6.6 g/dL (ref 6.1–8.1)

## 2015-09-02 LAB — HEMOGLOBIN A1C
Hgb A1c MFr Bld: 6.4 % — ABNORMAL HIGH (ref <5.7)
MEAN PLASMA GLUCOSE: 137 mg/dL — AB (ref <117)

## 2015-09-02 NOTE — Progress Notes (Signed)
Subjective:    Patient ID: Juan Chavez, male    DOB: [birth year], 64 y.o.   MRN: [id number]  09/02/2015  Hypertension and glucose intolerance   HPI This 65 y.o. male presents for five month follow-up:  1. HTN: increased Clonidine to 0.'1mg'$  tid but decreased to bid when readings improved one month ago.  BP running very good; home BP running 130/80.  Feels great; has great energy; has felt so good since 05/2015.  2. DMII: HgbA1c of 6.5 at last visit. B:  Cheerios, banana, fruit, water or cup of coffee Snack:  None Lunch:  [place] stew, water; skips a lot.   Snack:  Rare Supper:  Salads, vegetables.   No tea or sodas.  Rare meat.  Rare protein.  Eats a lot of beans.   Snack:  Popcorn.  3.  Nasal congestion: no fever/chills/sweats.  No headache.  No ear pain or sore throat.  No rhinorrhea; +nasal congestion L side; +PND.  No coughing; no SOB.  No v/d.  Started Flonase daily for several weeks.  Started Claritin yesterday.    4.  Prostate cancer/urinary incontinence: follow-up in 07/2015.  Recommended exercises.  Lemon water.    Review of Systems  Constitutional: Negative for fever, chills, diaphoresis, activity change, appetite change and fatigue.  Eyes: Negative for visual disturbance.  Respiratory: Negative for cough and shortness of breath.   Cardiovascular: Negative for chest pain, palpitations and leg swelling.  Endocrine: Negative for cold intolerance, heat intolerance, polydipsia, polyphagia and polyuria.  Neurological: Negative for dizziness, tremors, seizures, syncope, facial asymmetry, speech difficulty, weakness, light-headedness, numbness and headaches.    Past Medical History  Diagnosis Date  . Personal history of urinary calculi   . Hematuria, unspecified   . Other abnormal glucose   . Unspecified vitamin D deficiency   . Other B-complex deficiencies   . Personal history of malignant neoplasm of prostate   . Pure hypercholesterolemia   . Allergic rhinitis,  cause unspecified   . Essential hypertension, benign   . Nonspecific abnormal electrocardiogram (ECG) (EKG)   . Impotence of organic origin   . Obstructive sleep apnea (adult) (pediatric)     CPAP  . Unspecified urinary incontinence   . Chicken pox     childhood  . Measles     childhood  . Mumps     childhood  . Cancer [facility])     Prostate cancer  . Allergy    Past Surgical History  Procedure Laterality Date  . Elbow surgery    . Prostatectomy  2005  . Urological surgery  07/2009  . Colonoscopy  01/22/2013    normal.  [facility].  Repeat 5 years.   Not on File Current Outpatient Prescriptions  Medication Sig Dispense Refill  . aspirin 81 MG tablet Take 81 mg by mouth daily.    . cholecalciferol (VITAMIN D) 1000 UNITS tablet Take 1,000 Units by mouth 2 (two) times daily.    Marland Kitchen CIALIS 20 MG tablet TAKE 1 TABLET (20 MG TOTAL) BY MOUTH DAILY AS NEEDED FOR ERECTILE DYSFUNCTION. 8 tablet 11  . cloNIDine (CATAPRES) 0.1 MG tablet Take 1 tablet (0.1 mg total) by mouth 3 (three) times daily. 270 tablet 3  . Cyanocobalamin (VITAMIN B-12 PO) Take by mouth daily.    Marland Kitchen diltiazem (CARDIZEM CD) 180 MG 24 hr capsule Take 1 capsule (180 mg total) by mouth 2 (two) times daily. 180 capsule 3  . ELMIRON 100 MG capsule TAKE 1 CAPSULE (100 MG TOTAL)  BY MOUTH DAILY. 30 capsule 5  . enalapril (VASOTEC) 20 MG tablet Take 1 tablet (20 mg total) by mouth 2 (two) times daily. 180 tablet 3  . fluticasone (FLONASE) 50 MCG/ACT nasal spray PLACE 2 SPRAYS INTO BOTH NOSTRILS DAILY. 16 g 11  . loratadine (CLARITIN) 10 MG tablet Take 1 tablet (10 mg total) by mouth daily. 30 tablet 11  . MYRBETRIQ 50 MG TB24 Take 1 tablet by mouth daily. For urinary incontinence    . zoster vaccine live, PF, (ZOSTAVAX) 16109 UNT/0.65ML injection Inject 19,400 Units into the skin once. 1 each 0   No current facility-administered medications for this visit.   Social History   Social History  . Marital Status: Married    Spouse Name:  N/A  . Number of Children: 2  . Years of Education: N/A   Occupational History  . [employer]    Social History Main Topics  . Smoking status: Never Smoker   . Smokeless tobacco: Never Used  . Alcohol Use: Yes     Comment: occasional drinks beer 2 per week  . Drug Use: No  . Sexual Activity:    Partners: Female   Other Topics Concern  . Not on file   Social History Narrative   Marital status:  Married x 61 years happily married.      Children:  2 children; 4 grandchildren.      Lives: with wife.  Children local.      Employment: [employer] x 44 years.  Plans to retire unknown.       Tobacco:  Never.      Alcohol: [place] Wine 0-1 glass per day.      Drugs: none      Exercise: moderate, walking and running daily for 15 minutes.      Seatbelt:  Always uses seat belts.       Smoke alarm and carbon monoxide detector in the home.      Caffeine use: none.       Guns:  1 gun loaded; others unloaded.         Family History  Problem Relation Age of Onset  . Heart disease Father 49    AMI age 19; CM.  Marland Kitchen Hypertension Father   . Mental illness Sister     home in [place]  . Alcohol abuse Sister   . Hypertension Mother   . Heart disease Mother     AMI years ago; no CABG/stents.  . Arthritis Mother   . Heart disease Brother 46    AMI; CM       Objective:    BP 168/98 mmHg  Pulse 69  Temp(Src) 98.9 F (37.2 C) (Oral)  Resp 16  Ht '5\' 7"'$  (1.702 m)  Wt 234 lb 3.2 oz (106.232 kg)  BMI 36.67 kg/m2  SpO2 97% Physical Exam  Constitutional: He is oriented to person, place, and time. He appears well-developed and well-nourished. No distress.  HENT:  Head: Normocephalic and atraumatic.  Right Ear: External ear normal.  Left Ear: External ear normal.  Nose: Nose normal.  Mouth/Throat: Oropharynx is clear and moist.  Eyes: Conjunctivae and EOM are normal. Pupils are equal, round, and reactive to light.  Neck: Normal range of motion. Neck supple. Carotid bruit  is not present. No thyromegaly present.  Cardiovascular: Normal rate, regular rhythm, normal heart sounds and intact distal pulses.  Exam reveals no gallop and no friction rub.   No murmur heard. Pulmonary/Chest: Effort  normal and breath sounds normal. He has no wheezes. He has no rales.  Abdominal: Soft. Bowel sounds are normal. He exhibits no distension and no mass. There is no tenderness. There is no rebound and no guarding.  Lymphadenopathy:    He has no cervical adenopathy.  Neurological: He is alert and oriented to person, place, and time. No cranial nerve deficit.  Skin: Skin is warm and dry. No rash noted. He is not diaphoretic.  Psychiatric: He has a normal mood and affect. His behavior is normal.  Nursing note and vitals reviewed.  Results for orders placed or performed in visit on 09/02/15  CBC with Differential/Platelet  Result Value Ref Range   WBC 6.0 4.0 - 10.5 K/uL   RBC 4.56 4.22 - 5.81 MIL/uL   Hemoglobin 13.0 13.0 - 17.0 g/dL   HCT 38.2 (L) 39.0 - 52.0 %   MCV 83.8 78.0 - 100.0 fL   MCH 28.5 26.0 - 34.0 pg   MCHC 34.0 30.0 - 36.0 g/dL   RDW 14.0 11.5 - 15.5 %   Platelets 160 150 - 400 K/uL   MPV 10.3 8.6 - 12.4 fL   Neutrophils Relative % 63 43 - 77 %   Neutro Abs 3.8 1.7 - 7.7 K/uL   Lymphocytes Relative 28 12 - 46 %   Lymphs Abs 1.7 0.7 - 4.0 K/uL   Monocytes Relative 6 3 - 12 %   Monocytes Absolute 0.4 0.1 - 1.0 K/uL   Eosinophils Relative 2 0 - 5 %   Eosinophils Absolute 0.1 0.0 - 0.7 K/uL   Basophils Relative 1 0 - 1 %   Basophils Absolute 0.1 0.0 - 0.1 K/uL   Smear Review Criteria for review not met   Comprehensive metabolic panel  Result Value Ref Range   Sodium 141 135 - 146 mmol/L   Potassium 3.6 3.5 - 5.3 mmol/L   Chloride 106 98 - 110 mmol/L   CO2 27 20 - 31 mmol/L   Glucose, Bld 104 (H) 65 - 99 mg/dL   BUN 15 7 - 25 mg/dL   Creat 0.88 0.70 - 1.25 mg/dL   Total Bilirubin 0.5 0.2 - 1.2 mg/dL   Alkaline Phosphatase 67 40 - 115 U/L   AST 18 10  - 35 U/L   ALT 25 9 - 46 U/L   Total Protein 6.6 6.1 - 8.1 g/dL   Albumin 3.9 3.6 - 5.1 g/dL   Calcium 8.8 8.6 - 10.3 mg/dL  Hemoglobin A1c  Result Value Ref Range   Hgb A1c MFr Bld 6.4 (H) <5.7 %   Mean Plasma Glucose 137 (H) <117 mg/dL  HIV antibody  Result Value Ref Range   HIV 1&2 Ab, 4th Generation NONREACTIVE NONREACTIVE  Hepatitis C antibody  Result Value Ref Range   HCV Ab NEGATIVE NEGATIVE       Assessment & Plan:   1. Type 2 diabetes mellitus without complication, without long-term current use of insulin ([place])   2. Essential hypertension   3. Screening for HIV (human immunodeficiency virus)   4. Need for hepatitis C screening test   5. Other seasonal allergic rhinitis   6. Need for prophylactic vaccination against Streptococcus pneumoniae (pneumococcus)     Orders Placed This Encounter  Procedures  . Pneumococcal conjugate vaccine 13-valent IM  . CBC with Differential/Platelet  . Comprehensive metabolic panel  . Hemoglobin A1c  . HIV antibody  . Hepatitis C antibody   No orders of the defined types were  placed in this encounter.    Return in about 3 months (around 12/01/2015) for complete physical examiniation.    Kristi Elayne Guerin, M.D. [facility] [address] [place], [place]  [address] [phone] phone [phone] fax

## 2015-09-02 NOTE — Patient Instructions (Signed)
Pneumococcal Vaccine, Polyvalent suspension for injection What is this medicine? PNEUMOCOCCAL VACCINE (NEU mo KOK al vak SEEN) is a vaccine used to prevent pneumococcus bacterial infections. These bacteria can cause serious infections like pneumonia, meningitis, and blood infections. This vaccine will lower your chance of getting pneumonia. If you do get pneumonia, it can make your symptoms milder and your illness shorter. This vaccine will not treat an infection and will not cause infection. This vaccine is recommended for infants and young children, adults with certain medical conditions, and adults 65 years or older. This medicine may be used for other purposes; ask your health care provider or pharmacist if you have questions. What should I tell my health care provider before I take this medicine? They need to know if you have any of these conditions: -bleeding problems -fever -immune system problems -an unusual or allergic reaction to pneumococcal vaccine, diphtheria toxoid, other vaccines, latex, other medicines, foods, dyes, or preservatives -pregnant or trying to get pregnant -breast-feeding How should I use this medicine? This vaccine is for injection into a muscle. It is given by a health care professional. A copy of Vaccine Information Statements will be given before each vaccination. Read this sheet carefully each time. The sheet may change frequently. Talk to your pediatrician regarding the use of this medicine in children. While this drug may be prescribed for children as young as 71 weeks old for selected conditions, precautions do apply. Overdosage: If you think you have taken too much of this medicine contact a poison control center or emergency room at once. NOTE: This medicine is only for you. Do not share this medicine with others. What if I miss a dose? It is important not to miss your dose. Call your doctor or health care professional if you are unable to keep an  appointment. What may interact with this medicine? -medicines for cancer chemotherapy -medicines that suppress your immune function -steroid medicines like prednisone or cortisone This list may not describe all possible interactions. Give your health care provider a list of all the medicines, herbs, non-prescription drugs, or dietary supplements you use. Also tell them if you smoke, drink alcohol, or use illegal drugs. Some items may interact with your medicine. What should I watch for while using this medicine? Mild fever and pain should go away in 3 days or less. Report any unusual symptoms to your doctor or health care professional. What side effects may I notice from receiving this medicine? Side effects that you should report to your doctor or health care professional as soon as possible: -allergic reactions like skin rash, itching or hives, swelling of the face, lips, or tongue -breathing problems -confused -fast or irregular heartbeat -fever over 102 degrees F -seizures -unusual bleeding or bruising -unusual muscle weakness Side effects that usually do not require medical attention (report to your doctor or health care professional if they continue or are bothersome): -aches and pains -diarrhea -fever of 102 degrees F or less -headache -irritable -loss of appetite -pain, tender at site where injected -trouble sleeping This list may not describe all possible side effects. Call your doctor for medical advice about side effects. You may report side effects to FDA at 1-800-FDA-1088. Where should I keep my medicine? This does not apply. This vaccine is given in a clinic, pharmacy, doctor's office, or other health care setting and will not be stored at home. NOTE: This sheet is a summary. It may not cover all possible information. If you have questions about this  medicine, talk to your doctor, pharmacist, or health care provider.    2016, Elsevier/Gold Standard. (2014-05-17  10:27:27)   Basic Carbohydrate Counting for Diabetes Mellitus Carbohydrate counting is a method for keeping track of the amount of carbohydrates you eat. Eating carbohydrates naturally increases the level of sugar (glucose) in your blood, so it is important for you to know the amount that is okay for you to have in every meal. Carbohydrate counting helps keep the level of glucose in your blood within normal limits. The amount of carbohydrates allowed is different for every person. A dietitian can help you calculate the amount that is right for you. Once you know the amount of carbohydrates you can have, you can count the carbohydrates in the foods you want to eat. Carbohydrates are found in the following foods:  Grains, such as breads and cereals.  Dried beans and soy products.  Starchy vegetables, such as potatoes, peas, and corn.  Fruit and fruit juices.  Milk and yogurt.  Sweets and snack foods, such as cake, cookies, candy, chips, soft drinks, and fruit drinks. CARBOHYDRATE COUNTING There are two ways to count the carbohydrates in your food. You can use either of the methods or a combination of both. Reading the "Nutrition Facts" on [place] The "Nutrition Facts" is an area that is included on the labels of almost all packaged food and beverages in the [place]. It includes the serving size of that food or beverage and information about the nutrients in each serving of the food, including the grams (g) of carbohydrate per serving.  Decide the number of servings of this food or beverage that you will be able to eat or drink. Multiply that number of servings by the number of grams of carbohydrate that is listed on the label for that serving. The total will be the amount of carbohydrates you will be having when you eat or drink this food or beverage. Learning Standard Serving Sizes of Food When you eat food that is not packaged or does not include "Nutrition Facts" on the label,  you need to measure the servings in order to count the amount of carbohydrates.A serving of most carbohydrate-rich foods contains about 15 g of carbohydrates. The following list includes serving sizes of carbohydrate-rich foods that provide 15 g ofcarbohydrate per serving:   1 slice of bread (1 oz) or 1 six-inch tortilla.    of a hamburger bun or English muffin.  4-6 crackers.   cup unsweetened dry cereal.    cup hot cereal.   cup rice or pasta.    cup mashed potatoes or  of a large baked potato.  1 cup fresh fruit or one small piece of fruit.    cup canned or frozen fruit or fruit juice.  1 cup milk.   cup plain fat-free yogurt or yogurt sweetened with artificial sweeteners.   cup cooked dried beans or starchy vegetable, such as peas, corn, or potatoes.  Decide the number of standard-size servings that you will eat. Multiply that number of servings by 15 (the grams of carbohydrates in that serving). For example, if you eat 2 cups of strawberries, you will have eaten 2 servings and 30 g of carbohydrates (2 servings x 15 g = 30 g). For foods such as soups and casseroles, in which more than one food is mixed in, you will need to count the carbohydrates in each food that is included. EXAMPLE OF CARBOHYDRATE COUNTING Sample Dinner  3 oz chicken breast.  cup of brown rice.   cup of corn.  1 cup milk.   1 cup strawberries with sugar-free whipped topping.  Carbohydrate Calculation Step 1: Identify the foods that contain carbohydrates:   Rice.   Corn.   Milk.   Strawberries. Step 2:Calculate the number of servings eaten of each:   2 servings of rice.   1 serving of corn.   1 serving of milk.   1 serving of strawberries. Step 3: Multiply each of those number of servings by 15 g:   2 servings of rice x 15 g = 30 g.   1 serving of corn x 15 g = 15 g.   1 serving of milk x 15 g = 15 g.   1 serving of strawberries x 15 g = 15  g. Step 4: Add together all of the amounts to find the total grams of carbohydrates eaten: 30 g + 15 g + 15 g + 15 g = 75 g.   This information is not intended to replace advice given to you by your health care provider. Make sure you discuss any questions you have with your health care provider.   Document Released: 08/10/2005 Document Revised: 08/31/2014 Document Reviewed: 07/07/2013 Elsevier Interactive Patient Education [employer].

## 2015-09-03 LAB — HEPATITIS C ANTIBODY: HCV Ab: NEGATIVE

## 2015-09-03 LAB — HIV ANTIBODY (ROUTINE TESTING W REFLEX): HIV 1&2 Ab, 4th Generation: NONREACTIVE

## 2015-11-13 ENCOUNTER — Ambulatory Visit (INDEPENDENT_AMBULATORY_CARE_PROVIDER_SITE_OTHER): Payer: 59 | Admitting: Physician Assistant

## 2015-11-13 VITALS — BP 156/100 | HR 61 | Temp 98.0°F | Resp 18 | Ht 71.0 in | Wt 237.0 lb

## 2015-11-13 DIAGNOSIS — IMO0001 Reserved for inherently not codable concepts without codable children: Secondary | ICD-10-CM

## 2015-11-13 DIAGNOSIS — R030 Elevated blood-pressure reading, without diagnosis of hypertension: Secondary | ICD-10-CM

## 2015-11-13 DIAGNOSIS — H6982 Other specified disorders of Eustachian tube, left ear: Secondary | ICD-10-CM

## 2015-11-13 MED ORDER — METHYLPREDNISOLONE ACETATE 80 MG/ML IJ SUSP
80.0000 mg | Freq: Once | INTRAMUSCULAR | Status: AC
Start: 2015-11-13 — End: 2015-11-13
  Administered 2015-11-13: 80 mg via INTRAMUSCULAR

## 2015-11-13 NOTE — Patient Instructions (Addendum)
Please finish Amoxicillin.  Purchase some Zyrtec and take this at night for five days.  Continue taking your claritin and flonase throughout allergy season.    Monitor you BP pressure for 1-2 weeks and report back to [place] if pressure remains greater than 150/90.   IF you received an x-ray today, you will receive an invoice from [facility]. Please contact [facility] at [phone] with questions or concerns regarding your invoice.   IF you received labwork today, you will receive an invoice from [employer]. Please contact [facility] at [phone] with questions or concerns regarding your invoice.   Our billing staff will not be able to assist you with questions regarding bills from these companies.  You will be contacted with the lab results as soon as they are available. The fastest way to get your results is to activate your My Chart account. Instructions are located on the last page of this paperwork. If you have not heard from [place] regarding the results in 2 weeks, please contact this office.

## 2015-11-13 NOTE — Progress Notes (Signed)
f  11/13/2015 8:49 AM   DOB: [birth year] / MRN: [id number]  SUBJECTIVE:  Juan Chavez is a 65 y.o. male presenting for sinus pressure and left ear pain that he descrines as a pressure sensation.  He has been seen previously and was prescribed Amoxicillin 875 BID and has had no relief with this. He is on day 9 of therapy.    He has No Known Allergies.   He  has a past medical history of Personal history of urinary calculi; Hematuria, unspecified; Other abnormal glucose; Unspecified vitamin D deficiency; Other B-complex deficiencies; Personal history of malignant neoplasm of prostate; Pure hypercholesterolemia; Allergic rhinitis, cause unspecified; Essential hypertension, benign; Nonspecific abnormal electrocardiogram (ECG) (EKG); Impotence of organic origin; Obstructive sleep apnea (adult) (pediatric); Unspecified urinary incontinence; Chicken pox; Measles; Mumps; Cancer ([place]); and Allergy.    He  reports that he has never smoked. He has never used smokeless tobacco. He reports that he drinks alcohol. He reports that he does not use illicit drugs. He  reports that he currently engages in sexual activity and has had male partners. The patient  has past surgical history that includes Elbow surgery; Prostatectomy (2005); urological surgery (07/2009); and Colonoscopy (01/22/2013).  His family history includes Alcohol abuse in his sister; Arthritis in his mother; Heart disease in his mother; Heart disease (age of onset: 62) in his brother; Heart disease (age of onset: 23) in his father; Hypertension in his father and mother; Mental illness in his sister.  Review of Systems  Constitutional: Negative for fever and chills.  HENT: Negative for ear pain.   Respiratory: Negative for cough.   Skin: Negative for rash.  Neurological: Negative for headaches.    Problem list and medications reviewed and updated by myself where necessary, and exist elsewhere in the encounter.   OBJECTIVE:  BP 156/100 mmHg   Pulse 61  Temp(Src) 98 F (36.7 C) (Oral)  Resp 18  Ht 5\' 11"  (1.803 m)  Wt 237 lb (107.502 kg)  BMI 33.07 kg/m2  SpO2 98%  Physical Exam  Constitutional: He is oriented to person, place, and time. He appears well-developed and well-nourished.  HENT:  Nose: Mucosal edema (bluish hue) present. Right sinus exhibits no maxillary sinus tenderness and no frontal sinus tenderness. Left sinus exhibits no maxillary sinus tenderness and no frontal sinus tenderness.  Mouth/Throat: Uvula is midline, oropharynx is clear and moist and mucous membranes are normal. No oropharyngeal exudate.  Neurological: He is alert and oriented to person, place, and time. He has normal strength. No cranial nerve deficit or sensory deficit. He displays a negative Romberg sign.  Psychiatric: He has a normal mood and affect. His speech is normal and behavior is normal. Judgment and thought content normal. Cognition and memory are normal.    No results found for this or any previous visit (from the past 72 hour(s)).  No results found.  ASSESSMENT AND PLAN  Sylvester was seen today for ear fullness and sinusitis.  Diagnoses and all orders for this visit:  ETD (eustachian tube dysfunction), left: His blood pressure is somewhat elevated and will avoid pseudoephedrine due to this.  Will add on five days of zyrtec to his regimen along with 80 depomedrol here to reduce nasal mucosal swelling.  I have advised that he continue flonase throughout allergy season.    Elevated BP:  His home measures run around 130/70. He is taking clonidine and enalapril. I have advised that he purchase a cuff and monitor this. Report back to [place]  in 1 to 2 weeks for medication titration if his pressure remains elevated.      The patient was advised to call or return to clinic if he does not see an improvement in symptoms or to seek the care of the closest emergency department if he worsens with the above plan.   Philis Fendt, MHS, PA-C [facility] 11/13/2015 8:49 AM

## 2015-11-18 ENCOUNTER — Telehealth: Payer: Self-pay

## 2015-11-18 ENCOUNTER — Other Ambulatory Visit: Payer: Self-pay | Admitting: Family Medicine

## 2015-11-18 NOTE — Telephone Encounter (Signed)
Pt is wanting to talk with Philis Fendt he is not doing any better -that is all he would say  Best number [phone]

## 2015-11-19 NOTE — Telephone Encounter (Signed)
Pt wife called again.  If he does not answer home number please call cell.  I will not be here tomorrow so please do not send to my box.  Send to clinical message pool.  Thanks

## 2015-11-19 NOTE — Telephone Encounter (Signed)
Pt called again today since he isn't feeling any better, pt was advised to come in but stated he was told by the Dr to call back if no better. Please call [phone]

## 2015-11-21 ENCOUNTER — Other Ambulatory Visit: Payer: Self-pay | Admitting: Physician Assistant

## 2015-11-21 MED ORDER — AMOXICILLIN 875 MG PO TABS: 875.0000 mg | ORAL_TABLET | Freq: Two times a day (BID) | ORAL | Status: DC

## 2015-11-21 NOTE — Progress Notes (Signed)
Starting abx.  Patient is no better with depomedrol and zyrtec-D.  Philis Fendt, MS, PA-C 8:38 PM, 11/21/2015

## 2015-11-21 NOTE — Telephone Encounter (Signed)
Will try him on an antibiotic.  I'll send this via orders only.  Philis Fendt, MS, PA-C 8:36 PM, 11/21/2015

## 2015-11-22 NOTE — Telephone Encounter (Signed)
Pt advised.

## 2015-12-09 ENCOUNTER — Encounter: Payer: 59 | Admitting: Family Medicine

## 2015-12-17 DIAGNOSIS — H905 Unspecified sensorineural hearing loss: Secondary | ICD-10-CM | POA: Insufficient documentation

## 2015-12-17 DIAGNOSIS — H903 Sensorineural hearing loss, bilateral: Secondary | ICD-10-CM | POA: Insufficient documentation

## 2015-12-24 ENCOUNTER — Encounter: Payer: Self-pay | Admitting: Family Medicine

## 2015-12-24 ENCOUNTER — Ambulatory Visit (INDEPENDENT_AMBULATORY_CARE_PROVIDER_SITE_OTHER): Payer: 59 | Admitting: Family Medicine

## 2015-12-24 VITALS — BP 154/85 | HR 67 | Temp 98.5°F | Resp 16 | Ht 69.5 in | Wt 226.8 lb

## 2015-12-24 DIAGNOSIS — I10 Essential (primary) hypertension: Secondary | ICD-10-CM | POA: Diagnosis not present

## 2015-12-24 DIAGNOSIS — R7309 Other abnormal glucose: Secondary | ICD-10-CM

## 2015-12-24 DIAGNOSIS — J301 Allergic rhinitis due to pollen: Secondary | ICD-10-CM | POA: Diagnosis not present

## 2015-12-24 DIAGNOSIS — N393 Stress incontinence (female) (male): Secondary | ICD-10-CM | POA: Diagnosis not present

## 2015-12-24 DIAGNOSIS — H903 Sensorineural hearing loss, bilateral: Secondary | ICD-10-CM

## 2015-12-24 DIAGNOSIS — E669 Obesity, unspecified: Secondary | ICD-10-CM

## 2015-12-24 DIAGNOSIS — H905 Unspecified sensorineural hearing loss: Secondary | ICD-10-CM

## 2015-12-24 DIAGNOSIS — Z23 Encounter for immunization: Secondary | ICD-10-CM | POA: Diagnosis not present

## 2015-12-24 DIAGNOSIS — Z0000 Encounter for general adult medical examination without abnormal findings: Secondary | ICD-10-CM | POA: Diagnosis not present

## 2015-12-24 DIAGNOSIS — Z8546 Personal history of malignant neoplasm of prostate: Secondary | ICD-10-CM | POA: Diagnosis not present

## 2015-12-24 LAB — CBC WITH DIFFERENTIAL/PLATELET
BASOS PCT: 1 %
Basophils Absolute: 66 cells/uL (ref 0–200)
EOS PCT: 1 %
Eosinophils Absolute: 66 cells/uL (ref 15–500)
HCT: 40.9 % (ref 38.5–50.0)
HEMOGLOBIN: 14.1 g/dL (ref 13.2–17.1)
LYMPHS ABS: 1320 {cells}/uL (ref 850–3900)
Lymphocytes Relative: 20 %
MCH: 29.6 pg (ref 27.0–33.0)
MCHC: 34.5 g/dL (ref 32.0–36.0)
MCV: 85.9 fL (ref 80.0–100.0)
MPV: 9.8 fL (ref 7.5–12.5)
Monocytes Absolute: 462 cells/uL (ref 200–950)
Monocytes Relative: 7 %
NEUTROS ABS: 4686 {cells}/uL (ref 1500–7800)
Neutrophils Relative %: 71 %
Platelets: 201 10*3/uL (ref 140–400)
RBC: 4.76 MIL/uL (ref 4.20–5.80)
RDW: 14.6 % (ref 11.0–15.0)
WBC: 6.6 10*3/uL (ref 3.8–10.8)

## 2015-12-24 LAB — POC MICROSCOPIC URINALYSIS (UMFC)

## 2015-12-24 LAB — LIPID PANEL
CHOL/HDL RATIO: 3.9 ratio (ref <=5.0)
Cholesterol: 158 mg/dL (ref 125–200)
HDL: 41 mg/dL (ref >=40)
LDL Cholesterol: 100 mg/dL (ref <130)
Triglycerides: 84 mg/dL (ref <150)
VLDL: 17 mg/dL (ref <30)

## 2015-12-24 LAB — COMPREHENSIVE METABOLIC PANEL
ALBUMIN: 4.2 g/dL (ref 3.6–5.1)
ALT: 24 U/L (ref 9–46)
AST: 18 U/L (ref 10–35)
Alkaline Phosphatase: 83 U/L (ref 40–115)
BILIRUBIN TOTAL: 0.6 mg/dL (ref 0.2–1.2)
BUN: 14 mg/dL (ref 7–25)
CO2: 27 mmol/L (ref 20–31)
CREATININE: 0.9 mg/dL (ref 0.70–1.25)
Calcium: 9.1 mg/dL (ref 8.6–10.3)
Chloride: 104 mmol/L (ref 98–110)
Glucose, Bld: 109 mg/dL — ABNORMAL HIGH (ref 65–99)
Potassium: 3.7 mmol/L (ref 3.5–5.3)
SODIUM: 140 mmol/L (ref 135–146)
TOTAL PROTEIN: 7 g/dL (ref 6.1–8.1)

## 2015-12-24 LAB — POCT URINALYSIS DIP (MANUAL ENTRY)
BILIRUBIN UA: NEGATIVE
Glucose, UA: NEGATIVE
Ketones, POC UA: NEGATIVE
Leukocytes, UA: NEGATIVE
Nitrite, UA: NEGATIVE
Protein Ur, POC: 30 — AB
SPEC GRAV UA: 1.015
UROBILINOGEN UA: 2
pH, UA: 7.5

## 2015-12-24 LAB — HEMOGLOBIN A1C
HEMOGLOBIN A1C: 6.7 % — AB (ref <5.7)
MEAN PLASMA GLUCOSE: 146 mg/dL

## 2015-12-24 LAB — TSH: TSH: 0.87 m[IU]/L (ref 0.40–4.50)

## 2015-12-24 MED ORDER — ZOSTER VACCINE LIVE 19400 UNT/0.65ML ~~LOC~~ SUSR: 0.6500 mL | Freq: Once | SUBCUTANEOUS | Status: DC

## 2015-12-24 MED ORDER — CLONIDINE HCL 0.1 MG PO TABS: 0.1000 mg | ORAL_TABLET | Freq: Three times a day (TID) | ORAL | Status: DC

## 2015-12-24 MED ORDER — FLUTICASONE PROPIONATE 50 MCG/ACT NA SUSP: NASAL | Status: DC

## 2015-12-24 MED ORDER — LORATADINE 10 MG PO TABS: ORAL_TABLET | ORAL | Status: DC

## 2015-12-24 MED ORDER — DILTIAZEM HCL ER COATED BEADS 180 MG PO CP24: 180.0000 mg | ORAL_CAPSULE | Freq: Two times a day (BID) | ORAL | Status: DC

## 2015-12-24 MED ORDER — AZELASTINE HCL 0.1 % NA SOLN: 2.0000 | Freq: Two times a day (BID) | NASAL | Status: DC

## 2015-12-24 MED ORDER — TADALAFIL 20 MG PO TABS: ORAL_TABLET | ORAL | Status: DC

## 2015-12-24 MED ORDER — ENALAPRIL MALEATE 20 MG PO TABS: 20.0000 mg | ORAL_TABLET | Freq: Two times a day (BID) | ORAL | Status: DC

## 2015-12-24 NOTE — Progress Notes (Signed)
Subjective:    Patient ID: Juan Chavez, male    DOB: September 07, 1950, 65 y.o.   MRN: [id number]  12/24/2015  Annual Exam   HPI This 65 y.o. male presents for Complete Physical Examination.  Last physical:  11-05-2014 Colonoscopy:  2008; 2016 [facility] TDAP:  2009 Pneumovax:  Prevnar 13 2016 Zostavax: none Influenza: 05/09/2015 Eye exam:  +glasses; overdue Dental exam:  Every six months.  L hearing loss: onset a month ago; s/p MRI brain; eating and hearing returned.  Feeling better now. Now that sinuses are clearing up, can hear better.  Taking Flonase.    HTN: Patient reports good compliance with medication, good tolerance to medication, and good symptom control.  Not checking much because wife has been checking hers.  Glucose intolerance: dietary modification  History of prostate cancer: followed by urology annually; also followed by oncology.   Review of Systems  Constitutional: Negative for fever, chills, diaphoresis, activity change, appetite change, fatigue and unexpected weight change.  HENT: Negative for congestion, dental problem, drooling, ear discharge, ear pain, facial swelling, hearing loss, mouth sores, nosebleeds, postnasal drip, rhinorrhea, sinus pressure, sneezing, sore throat, tinnitus, trouble swallowing and voice change.   Eyes: Negative for photophobia, pain, discharge, redness, itching and visual disturbance.  Respiratory: Negative for apnea, cough, choking, chest tightness, shortness of breath, wheezing and stridor.   Cardiovascular: Negative for chest pain, palpitations and leg swelling.  Gastrointestinal: Negative for nausea, vomiting, abdominal pain, diarrhea, constipation and blood in stool.  Endocrine: Negative for cold intolerance, heat intolerance, polydipsia, polyphagia and polyuria.  Genitourinary: Negative for dysuria, urgency, frequency, hematuria, flank pain, decreased urine volume, discharge, penile swelling, scrotal swelling, enuresis, difficulty  urinating, genital sores, penile pain and testicular pain.  Musculoskeletal: Negative for myalgias, back pain, joint swelling, arthralgias, gait problem, neck pain and neck stiffness.  Skin: Negative for color change, pallor, rash and wound.  Allergic/Immunologic: Negative for environmental allergies, food allergies and immunocompromised state.  Neurological: Negative for dizziness, tremors, seizures, syncope, facial asymmetry, speech difficulty, weakness, light-headedness, numbness and headaches.  Hematological: Negative for adenopathy. Does not bruise/bleed easily.  Psychiatric/Behavioral: Negative for suicidal ideas, hallucinations, behavioral problems, confusion, sleep disturbance, self-injury, dysphoric mood, decreased concentration and agitation. The patient is not nervous/anxious and is not hyperactive.     Past Medical History  Diagnosis Date  . Personal history of urinary calculi   . Hematuria, unspecified   . Other abnormal glucose   . Unspecified vitamin D deficiency   . Other B-complex deficiencies   . Personal history of malignant neoplasm of prostate   . Pure hypercholesterolemia   . Allergic rhinitis, cause unspecified   . Essential hypertension, benign   . Nonspecific abnormal electrocardiogram (ECG) (EKG)   . Impotence of organic origin   . Obstructive sleep apnea (adult) (pediatric)     CPAP  . Unspecified urinary incontinence   . Chicken pox     childhood  . Measles     childhood  . Mumps     childhood  . Cancer [facility])     Prostate cancer  . Allergy    Past Surgical History  Procedure Laterality Date  . Elbow surgery    . Prostatectomy  2005  . Urological surgery  07/2009  . Colonoscopy  01/22/2013    normal.  [facility].  Repeat 5 years.   No Known Allergies  Social History   Social History  . Marital Status: Married    Spouse Name: N/A  . Number of Children:  2  . Years of Education: N/A   Occupational History  . [employer]     Social History Main Topics  . Smoking status: Never Smoker   . Smokeless tobacco: Never Used  . Alcohol Use: 0.0 oz/week    0 Standard drinks or equivalent per week     Comment: occasional drinks beer 1 per week  . Drug Use: No  . Sexual Activity:    Partners: Female   Other Topics Concern  . Not on file   Social History Narrative   Marital status:  Married x 44 years happily married.      Children:  2 children; 4 grandchildren.      Lives: with wife.  Children local.      Employment: [employer] x 45 years.  Plans to retire unknown.       Tobacco:  Never.      Alcohol: [place] Wine 0-1 glass per day.      Drugs: none      Exercise: moderate, walking and running daily for 15 minutes.      Seatbelt:  Always uses seat belts.       Smoke alarm and carbon monoxide detector in the home.      Caffeine use: none.       Guns:  1 gun loaded; others unloaded.         Family History  Problem Relation Age of Onset  . Heart disease Father 56    AMI age 30; CM.  Marland Kitchen Hypertension Father   . Mental illness Sister     home in [place]  . Alcohol abuse Sister   . Hypertension Mother   . Heart disease Mother     AMI years ago; no CABG/stents.  . Arthritis Mother   . Heart disease Brother 68    AMI; CM       Objective:    BP 154/85 mmHg  Pulse 67  Temp(Src) 98.5 F (36.9 C) (Oral)  Resp 16  Ht 5' 9.5" (1.765 m)  Wt 226 lb 12.8 oz (102.876 kg)  BMI 33.02 kg/m2  SpO2 98% Physical Exam  Constitutional: He is oriented to person, place, and time. He appears well-developed and well-nourished. No distress.  HENT:  Head: Normocephalic and atraumatic.  Right Ear: External ear normal.  Left Ear: External ear normal.  Nose: Nose normal.  Mouth/Throat: Oropharynx is clear and moist.  Eyes: Conjunctivae and EOM are normal. Pupils are equal, round, and reactive to light.  Neck: Normal range of motion. Neck supple. Carotid bruit is not present. No thyromegaly present.  Cardiovascular:  Normal rate, regular rhythm, normal heart sounds and intact distal pulses.  Exam reveals no gallop and no friction rub.   No murmur heard. Pulmonary/Chest: Effort normal and breath sounds normal. He has no wheezes. He has no rales.  Abdominal: Soft. Bowel sounds are normal. He exhibits no distension and no mass. There is no tenderness. There is no rebound and no guarding.  Musculoskeletal:       Right shoulder: Normal.       Left shoulder: Normal.       Cervical back: Normal.  Lymphadenopathy:    He has no cervical adenopathy.  Neurological: He is alert and oriented to person, place, and time. He has normal reflexes. No cranial nerve deficit. He exhibits normal muscle tone. Coordination normal.  Skin: Skin is warm and dry. No rash noted. He is not diaphoretic.  Psychiatric: He has a normal  mood and affect. His behavior is normal. Judgment and thought content normal.   Depression screen [facility] 2/9 12/24/2015 12/24/2015 11/13/2015 09/02/2015 04/08/2015  Decreased Interest 0 0 0 0 0  Down, Depressed, Hopeless 0 0 0 0 0  PHQ - 2 Score 0 0 0 0 0    Fall Risk  12/24/2015 12/24/2015 09/02/2015 12/10/2014 11/05/2014  Falls in the past year? No No No No No       Assessment & Plan:   1. Routine physical examination   2. Other abnormal glucose   3. Essential hypertension, benign   4. Allergic rhinitis due to pollen   5. Obesity (BMI 30.0-34.9)   6. ASNHL (asymmetrical sensorineural hearing loss)   7. Genuine stress incontinence, male   8. H/O malignant neoplasm of prostate   9. Need for shingles vaccine     Orders Placed This Encounter  Procedures  . CBC with Differential/Platelet  . Comprehensive metabolic panel    Order Specific Question:  Has the patient fasted?    Answer:  Yes  . Hemoglobin A1c  . Lipid panel    Order Specific Question:  Has the patient fasted?    Answer:  Yes  . TSH  . POCT urinalysis dipstick  . POCT Microscopic Urinalysis ([facility])  . EKG 12-Lead   Meds ordered this  encounter  Medications  . loratadine (CLARITIN) 10 MG tablet    Sig: TAKE 1 TABLET (10 MG TOTAL) BY MOUTH DAILY.    Dispense:  90 tablet    Refill:  3  . fluticasone (FLONASE) 50 MCG/ACT nasal spray    Sig: PLACE 2 SPRAYS INTO BOTH NOSTRILS DAILY.    Dispense:  16 g    Refill:  11  . enalapril (VASOTEC) 20 MG tablet    Sig: Take 1 tablet (20 mg total) by mouth 2 (two) times daily.    Dispense:  180 tablet    Refill:  3  . diltiazem (CARDIZEM CD) 180 MG 24 hr capsule    Sig: Take 1 capsule (180 mg total) by mouth 2 (two) times daily.    Dispense:  180 capsule    Refill:  3  . cloNIDine (CATAPRES) 0.1 MG tablet    Sig: Take 1 tablet (0.1 mg total) by mouth 3 (three) times daily.    Dispense:  270 tablet    Refill:  3  . tadalafil (CIALIS) 20 MG tablet    Sig: TAKE 1 TABLET (20 MG TOTAL) BY MOUTH DAILY AS NEEDED FOR ERECTILE DYSFUNCTION.    Dispense:  8 tablet    Refill:  11  . Zoster Vaccine Live, PF, (ZOSTAVAX) 28413 UNT/0.65ML injection    Sig: Inject 19,400 Units into the skin once.    Dispense:  0.65 mL    Refill:  0  . azelastine (ASTELIN) 0.1 % nasal spray    Sig: Place 2 sprays into both nostrils 2 (two) times daily. Use in each nostril as directed    Dispense:  30 mL    Refill:  12    Return in about 6 months (around 06/25/2016) for recheck high blood pressure,prediabetes.    Duvan Mousel Elayne Guerin, M.D. [facility] [address] [place], [place]  [address] [phone] phone [phone] fax

## 2015-12-24 NOTE — Patient Instructions (Addendum)
   IF you received an x-ray today, you will receive an invoice from [facility]. Please contact [facility] at [phone] with questions or concerns regarding your invoice.   IF you received labwork today, you will receive an invoice from [facility]. Please contact [facility] at [phone] with questions or concerns regarding your invoice.   Our billing staff will not be able to assist you with questions regarding bills from these companies.  You will be contacted with the lab results as soon as they are available. The fastest way to get your results is to activate your My Chart account. Instructions are located on the last page of this paperwork. If you have not heard from us regarding the results in 2 weeks, please contact this office.    Keeping you healthy  Get these tests  Blood pressure- Have your blood pressure checked once a year by your healthcare provider.  Normal blood pressure is 120/80  Weight- Have your body mass index (BMI) calculated to screen for obesity.  BMI is a measure of body fat based on height and weight. You can also calculate your own BMI at www.nhlbisuport.com/bmi/.  Cholesterol- Have your cholesterol checked every year.  Diabetes- Have your blood sugar checked regularly if you have high blood pressure, high cholesterol, have a family history of diabetes or if you are overweight.  Screening for Colon Cancer- Colonoscopy starting at age 50.  Screening may begin sooner depending on your family history and other health conditions. Follow up colonoscopy as directed by your Gastroenterologist.  Screening for Prostate Cancer- Both blood work (PSA) and a rectal exam help screen for Prostate Cancer.  Screening begins at age 40 with African-American men and at age 50 with Caucasian men.  Screening may begin sooner depending on your family history.  Take these medicines  Aspirin- One aspirin daily can help prevent Heart  disease and Stroke.  Flu shot- Every fall.  Tetanus- Every 10 years.  Zostavax- Once after the age of 60 to prevent Shingles.  Pneumonia shot- Once after the age of 65; if you are younger than 65, ask your healthcare provider if you need a Pneumonia shot.  Take these steps  Don't smoke- If you do smoke, talk to your doctor about quitting.  For tips on how to quit, go to www.smokefree.gov or call 1-800-QUIT-NOW.  Be physically active- Exercise 5 days a week for at least 30 minutes.  If you are not already physically active start slow and gradually work up to 30 minutes of moderate physical activity.  Examples of moderate activity include walking briskly, mowing the yard, dancing, swimming, bicycling, etc.  Eat a healthy diet- Eat a variety of healthy food such as fruits, vegetables, low fat milk, low fat cheese, yogurt, lean meant, poultry, fish, beans, tofu, etc. For more information go to www.thenutritionsource.org  Drink alcohol in moderation- Limit alcohol intake to less than two drinks a day. Never drink and drive.  Dentist- Brush and floss twice daily; visit your dentist twice a year.  Depression- Your emotional health is as important as your physical health. If you're feeling down, or losing interest in things you would normally enjoy please talk to your healthcare provider.  Eye exam- Visit your eye doctor every year.  Safe sex- If you may be exposed to a sexually transmitted infection, use a condom.  Seat belts- Seat belts can save your life; always wear one.  Smoke/Carbon Monoxide detectors- These detectors need to be installed on   the appropriate level of your home.  Replace batteries at least once a year.  Skin cancer- When out in the sun, cover up and use sunscreen 15 SPF or higher.  Violence- If anyone is threatening you, please tell your healthcare provider.  Living Will/ Health care power of attorney- Speak with your healthcare provider and family. 

## 2015-12-31 ENCOUNTER — Telehealth: Payer: Self-pay | Admitting: *Deleted

## 2015-12-31 NOTE — Telephone Encounter (Signed)
Pt would like results from 12/24/15

## 2016-01-06 ENCOUNTER — Other Ambulatory Visit: Payer: Self-pay | Admitting: Family Medicine

## 2016-01-06 NOTE — Telephone Encounter (Signed)
Labs sent to lab pool to contact pt with results.

## 2016-02-12 ENCOUNTER — Other Ambulatory Visit: Payer: Self-pay | Admitting: Family Medicine

## 2016-05-17 ENCOUNTER — Encounter: Payer: Self-pay | Admitting: Emergency Medicine

## 2016-05-17 ENCOUNTER — Emergency Department
Admission: EM | Admit: 2016-05-17 | Discharge: 2016-05-17 | Disposition: A | Discharge status: Home / Self Care | Payer: 59 | Attending: Emergency Medicine | Admitting: Emergency Medicine

## 2016-05-17 ENCOUNTER — Emergency Department: Payer: 59

## 2016-05-17 DIAGNOSIS — Z79899 Other long term (current) drug therapy: Secondary | ICD-10-CM | POA: Diagnosis not present

## 2016-05-17 DIAGNOSIS — Z7982 Long term (current) use of aspirin: Secondary | ICD-10-CM | POA: Diagnosis not present

## 2016-05-17 DIAGNOSIS — M542 Cervicalgia: Secondary | ICD-10-CM | POA: Diagnosis present

## 2016-05-17 DIAGNOSIS — I10 Essential (primary) hypertension: Secondary | ICD-10-CM | POA: Insufficient documentation

## 2016-05-17 DIAGNOSIS — Z8546 Personal history of malignant neoplasm of prostate: Secondary | ICD-10-CM | POA: Insufficient documentation

## 2016-05-17 DIAGNOSIS — M62838 Other muscle spasm: Secondary | ICD-10-CM | POA: Diagnosis not present

## 2016-05-17 LAB — COMPREHENSIVE METABOLIC PANEL
ALBUMIN: 4.1 g/dL (ref 3.5–5.0)
ALT: 25 U/L (ref 17–63)
ANION GAP: 6 (ref 5–15)
AST: 23 U/L (ref 15–41)
Alkaline Phosphatase: 89 U/L (ref 38–126)
BUN: 17 mg/dL (ref 6–20)
CO2: 27 mmol/L (ref 22–32)
Calcium: 9 mg/dL (ref 8.9–10.3)
Chloride: 107 mmol/L (ref 101–111)
Creatinine, Ser: 1 mg/dL (ref 0.61–1.24)
GFR calc Af Amer: >60 mL/min (ref >60)
GFR calc non Af Amer: >60 mL/min (ref >60)
GLUCOSE: 125 mg/dL — AB (ref 65–99)
POTASSIUM: 3.3 mmol/L — AB (ref 3.5–5.1)
SODIUM: 140 mmol/L (ref 135–145)
Total Bilirubin: 0.6 mg/dL (ref 0.3–1.2)
Total Protein: 7.6 g/dL (ref 6.5–8.1)

## 2016-05-17 LAB — CBC WITH DIFFERENTIAL/PLATELET
BASOS ABS: 0.1 10*3/uL (ref 0–0.1)
BASOS PCT: 1 %
EOS ABS: 0.2 10*3/uL (ref 0–0.7)
Eosinophils Relative: 2 %
HCT: 40.1 % (ref 40.0–52.0)
HEMOGLOBIN: 14.2 g/dL (ref 13.0–18.0)
Lymphocytes Relative: 18 %
Lymphs Abs: 1.7 10*3/uL (ref 1.0–3.6)
MCH: 30.1 pg (ref 26.0–34.0)
MCHC: 35.4 g/dL (ref 32.0–36.0)
MCV: 85.1 fL (ref 80.0–100.0)
MONO ABS: 0.7 10*3/uL (ref 0.2–1.0)
MONOS PCT: 7 %
NEUTROS PCT: 72 %
Neutro Abs: 7 10*3/uL — ABNORMAL HIGH (ref 1.4–6.5)
Platelets: 171 10*3/uL (ref 150–440)
RBC: 4.71 MIL/uL (ref 4.40–5.90)
RDW: 14 % (ref 11.5–14.5)
WBC: 9.7 10*3/uL (ref 3.8–10.6)

## 2016-05-17 MED ORDER — CYCLOBENZAPRINE HCL 10 MG PO TABS
10.0000 mg | ORAL_TABLET | Freq: Once | ORAL | Status: AC
  Administered 2016-05-17: 10 mg via ORAL
  Filled 2016-05-17: qty 1

## 2016-05-17 MED ORDER — CYCLOBENZAPRINE HCL 10 MG PO TABS: 10.0000 mg | ORAL_TABLET | Freq: Three times a day (TID) | ORAL | 0 refills | Status: DC | PRN

## 2016-05-17 MED ORDER — KETOROLAC TROMETHAMINE 30 MG/ML IJ SOLN
30.0000 mg | Freq: Once | INTRAMUSCULAR | Status: AC
  Administered 2016-05-17: 30 mg via INTRAMUSCULAR
  Filled 2016-05-17: qty 1

## 2016-05-17 NOTE — ED Triage Notes (Signed)
Non-febrile. Htn.

## 2016-05-17 NOTE — Discharge Instructions (Signed)
Please return for worse pain or stiffness. Please follow-up with your doctor later this week. Use heat or heating pad as needed but do not fall asleep on burn you. You can try the Flexeril one pill 3 times a day. Be careful to make you groggy don't drive on it. Be careful not to fall and it either. If it does make you groggy just use it before bed.

## 2016-05-17 NOTE — ED Notes (Signed)
NAD noted at time of D/C. Pt denies comments/concerns at this time. Pt taken to the lobby via wheelchair with his wife.

## 2016-05-17 NOTE — ED Provider Notes (Signed)
[facility] Emergency Department Provider Note   ____________________________________________   First MD Initiated Contact with Patient 05/17/16 1900     (approximate)  I have reviewed the triage vital signs and the nursing notes.   HISTORY  Chief Complaint Neck Pain    HPI Juan Chavez is a 65 y.o. male who complains of neck pain and stiffness he had a little bit yesterday took some Advil and went away him back again today took some and felt better but then got worse again. He reports it hurts to move his neck. Patient reports he moved a freezer on Thursday was okay Thursday and Friday but then Saturday began having the symptoms. There is no central neck tenderness there is tenderness palpation on over the paraspinous muscles which exactly reproduces the patient's pain. The patient's paraspinous muscles are tight. I massage the patient's neck muscles and upper back muscles on the patient reports the pain got markedly better and his neck muscles actually got softer. Unfortunately shortly thereafter the ninth muscle spasms up again the patient still has a lot of pain. Toradol one dose improved the pain somewhat. I explained to patient and gave him just a low-dose and would only give him 1 dose since he was older and did not want to cause any problems with his stomach or kidneys. He understood that quite well.   Past Medical History:  Diagnosis Date  . Allergic rhinitis, cause unspecified   . Allergy   . Cancer [facility])    Prostate cancer  . Chicken pox    childhood  . Essential hypertension, benign   . Hematuria, unspecified   . Impotence of organic origin   . Measles    childhood  . Mumps    childhood  . Nonspecific abnormal electrocardiogram (ECG) (EKG)   . Obstructive sleep apnea (adult) (pediatric)    CPAP  . Other abnormal glucose   . Other B-complex deficiencies   . Personal history of malignant neoplasm of prostate   . Personal history of  urinary calculi   . Pure hypercholesterolemia   . Unspecified urinary incontinence   . Unspecified vitamin D deficiency     Patient Active Problem List   Diagnosis Date Noted  . ASNHL (asymmetrical sensorineural hearing loss) 12/17/2015  . Obesity (BMI 30.0-34.9) 07/03/2014  . H/O malignant neoplasm of prostate 12/30/2012  . 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 Other abnormal glucose 11/14/2012  . Unspecified vitamin D deficiency 11/14/2012  . Allergic rhinitis 11/14/2012  . Routine general medical examination at a health care facility 08/02/2012  . Essential hypertension, benign 08/02/2012  . Genuine stress incontinence, male 07/26/2012    Past Surgical History:  Procedure Laterality Date  . COLONOSCOPY  01/22/2013   normal.  [facility].  Repeat 5 years.  . ELBOW SURGERY    . PROSTATECTOMY  2005  . urological surgery  07/2009    Prior to Admission medications   Medication Sig Start Date End Date Taking? Authorizing Provider  aspirin 81 MG tablet Take 81 mg by mouth daily.    Historical Provider, MD  azelastine (ASTELIN) 0.1 % nasal spray Place 2 sprays into both nostrils 2 (two) times daily. Use in each nostril as directed 12/24/15   [name], MD  cholecalciferol (VITAMIN D) 1000 UNITS tablet Take 1,000 Units by mouth 2 (two) times daily.    Historical Provider, MD  cloNIDine (CATAPRES) 0.1 MG tablet Take 1 tablet (0.1 mg total) by mouth 3 (three) times daily.  12/24/15   [name], MD  Cyanocobalamin (VITAMIN B-12 PO) Take by mouth daily.    Historical Provider, MD  cyclobenzaprine (FLEXERIL) 10 MG tablet Take 1 tablet (10 mg total) by mouth 3 (three) times daily as needed for muscle spasms. 05/17/16   Nena Polio, MD  diltiazem (CARDIZEM CD) 180 MG 24 hr capsule Take 1 capsule (180 mg total) by mouth 2 (two) times daily. 12/24/15   [name], MD  ELMIRON 100 MG capsule TAKE 1 CAPSULE (100 MG TOTAL) BY MOUTH DAILY. 09/12/13   [name], MD  enalapril (VASOTEC) 20 MG  tablet Take 1 tablet (20 mg total) by mouth 2 (two) times daily. 12/24/15   [name], MD  fluticasone (FLONASE) 50 MCG/ACT nasal spray PLACE 2 SPRAYS INTO BOTH NOSTRILS DAILY. 12/24/15   [name], MD  loratadine (CLARITIN) 10 MG tablet TAKE 1 TABLET (10 MG TOTAL) BY MOUTH DAILY. 12/24/15   [name], MD  MYRBETRIQ 50 MG TB24 Take 1 tablet by mouth daily. For urinary incontinence 07/26/12   Historical Provider, MD  tadalafil (CIALIS) 20 MG tablet TAKE 1 TABLET (20 MG TOTAL) BY MOUTH DAILY AS NEEDED FOR ERECTILE DYSFUNCTION. 12/24/15   [name], MD  Zoster Vaccine Live, PF, (ZOSTAVAX) 09811 UNT/0.65ML injection Inject 19,400 Units into the skin once. 12/24/15   [name], MD    Allergies Review of patient's allergies indicates no known allergies.  Family History  Problem Relation Age of Onset  . Heart disease Father 96    AMI age 56; CM.  Marland Kitchen Hypertension Father   . Mental illness Sister     home in [place]  . Alcohol abuse Sister   . Hypertension Mother   . Heart disease Mother     AMI years ago; no CABG/stents.  . Arthritis Mother   . Heart disease Brother 30    AMI; CM    Social History Social History  Substance Use Topics  . Smoking status: Never Smoker  . Smokeless tobacco: Never Used  . Alcohol use 0.0 oz/week     Comment: occasional drinks beer 1 per week    Review of Systems Constitutional: No fever/chills Eyes: No visual changes. ENT: No sore throat. Cardiovascular: Denies chest pain. Respiratory: Denies shortness of breath. Gastrointestinal: No abdominal pain.  No nausea, no vomiting.  No diarrhea.  No constipation. Genitourinary: Negative for dysuria. Musculoskeletal: Negative for back pain. Skin: Negative for rash. Neurological: Negative for headaches, focal weakness or numbness.  10-point ROS otherwise negative.  ____________________________________________   PHYSICAL EXAM:  VITAL SIGNS: ED Triage Vitals  Enc Vitals Group     BP 05/17/16  1721 (!) 190/92     Pulse Rate 05/17/16 1719 65     Resp 05/17/16 1719 18     Temp 05/17/16 1719 98.5 F (36.9 C)     Temp Source 05/17/16 2101 Oral     SpO2 05/17/16 1719 98 %     Weight 05/17/16 1720 219 lb (99.3 kg)     Height 05/17/16 1720 6' (1.829 m)     Head Circumference --      Peak Flow --      Pain Score 05/17/16 1720 10     Pain Loc --      Pain Edu? --      Excl. in [place]? --     Constitutional: Alert and oriented. Well appearing But sitting with his neck held very stiffly. Eyes:  Conjunctivae are normal. PERRL. EOMI. Head: Atraumatic. Nose: No congestion/rhinnorhea. Mouth/Throat: Mucous membranes are moist.  Oropharynx non-erythematous. Neck: No stridor.  No cervical spine tenderness to palpation. There is tenderness on palpation of the paraspinous muscles on both side of neck. Palpation there exactly reproduces the pain. Please see above about my actions with him. Hematological/Lymphatic/Immunilogical: No cervical lymphadenopathy. Cardiovascular: Normal rate, regular rhythm. Grossly normal heart sounds.  Good peripheral circulation. Respiratory: Normal respiratory effort.  No retractions. Lungs CTAB. Gastrointestinal: Soft and nontender. No distention. No abdominal bruits. No CVA tenderness. Musculoskeletal: No lower extremity tenderness nor edema.  No joint effusions. Neurologic:  Normal speech and language. No gross focal neurologic deficits are appreciated.  Skin:  Skin is warm, dry and intact. No rash noted. Psychiatric: Mood and affect are normal. Speech and behavior are normal.  ____________________________________________   LABS (all labs ordered are listed, but only abnormal results are displayed)  Labs Reviewed  CBC WITH DIFFERENTIAL/PLATELET - Abnormal; Notable for the following:       Result Value   Neutro Abs 7.0 (*)    All other components within normal limits  COMPREHENSIVE METABOLIC PANEL - Abnormal; Notable for the following:    Potassium 3.3 (*)     Glucose, Bld 125 (*)    All other components within normal limits   ____________________________________________  EKG   ____________________________________________  RADIOLOGY  CT the neck only showed DJD. ____________________________________________   PROCEDURES  Procedure(s) performed:   Procedures  Critical Care performed:   ____________________________________________   INITIAL IMPRESSION / ASSESSMENT AND PLAN / ED COURSE  Pertinent labs & imaging results that were available during my care of the patient were reviewed by me and considered in my medical decision making (see chart for details).    Clinical Course     ____________________________________________   FINAL CLINICAL IMPRESSION(S) / ED DIAGNOSES  Final diagnoses:  Muscle spasms of neck      NEW MEDICATIONS STARTED DURING THIS VISIT:  Discharge Medication List as of 05/17/2016  8:29 PM    START taking these medications   Details  cyclobenzaprine (FLEXERIL) 10 MG tablet Take 1 tablet (10 mg total) by mouth 3 (three) times daily as needed for muscle spasms., Starting Sun 05/17/2016, Print         Note:  This document was prepared using Dragon voice recognition software and may include unintentional dictation errors.    Nena Polio, MD 05/17/16 2151

## 2016-05-17 NOTE — ED Triage Notes (Signed)
Neck pain yesterday but went away. Today not better.

## 2016-07-07 ENCOUNTER — Ambulatory Visit (INDEPENDENT_AMBULATORY_CARE_PROVIDER_SITE_OTHER): Payer: 59 | Admitting: Family Medicine

## 2016-07-07 ENCOUNTER — Encounter: Payer: Self-pay | Admitting: Family Medicine

## 2016-07-07 VITALS — BP 150/96 | HR 71 | Temp 98.5°F | Resp 16 | Ht 69.5 in | Wt 227.8 lb

## 2016-07-07 DIAGNOSIS — J301 Allergic rhinitis due to pollen: Secondary | ICD-10-CM | POA: Diagnosis not present

## 2016-07-07 DIAGNOSIS — Z23 Encounter for immunization: Secondary | ICD-10-CM | POA: Diagnosis not present

## 2016-07-07 DIAGNOSIS — R7309 Other abnormal glucose: Secondary | ICD-10-CM

## 2016-07-07 DIAGNOSIS — H903 Sensorineural hearing loss, bilateral: Secondary | ICD-10-CM

## 2016-07-07 DIAGNOSIS — Z8546 Personal history of malignant neoplasm of prostate: Secondary | ICD-10-CM | POA: Diagnosis not present

## 2016-07-07 DIAGNOSIS — H905 Unspecified sensorineural hearing loss: Secondary | ICD-10-CM

## 2016-07-07 DIAGNOSIS — I10 Essential (primary) hypertension: Secondary | ICD-10-CM | POA: Diagnosis not present

## 2016-07-07 LAB — CBC WITH DIFFERENTIAL/PLATELET
BASOS ABS: 66 {cells}/uL (ref 0–200)
Basophils Relative: 1 %
EOS ABS: 132 {cells}/uL (ref 15–500)
Eosinophils Relative: 2 %
HEMATOCRIT: 39.3 % (ref 38.5–50.0)
HEMOGLOBIN: 13.7 g/dL (ref 13.2–17.1)
LYMPHS ABS: 1650 {cells}/uL (ref 850–3900)
LYMPHS PCT: 25 %
MCH: 29.8 pg (ref 27.0–33.0)
MCHC: 34.9 g/dL (ref 32.0–36.0)
MCV: 85.4 fL (ref 80.0–100.0)
MONO ABS: 396 {cells}/uL (ref 200–950)
MPV: 10.1 fL (ref 7.5–12.5)
Monocytes Relative: 6 %
NEUTROS PCT: 66 %
Neutro Abs: 4356 cells/uL (ref 1500–7800)
Platelets: 193 10*3/uL (ref 140–400)
RBC: 4.6 MIL/uL (ref 4.20–5.80)
RDW: 14.3 % (ref 11.0–15.0)
WBC: 6.6 10*3/uL (ref 3.8–10.8)

## 2016-07-07 LAB — COMPREHENSIVE METABOLIC PANEL
ALBUMIN: 4 g/dL (ref 3.6–5.1)
ALT: 24 U/L (ref 9–46)
AST: 18 U/L (ref 10–35)
Alkaline Phosphatase: 75 U/L (ref 40–115)
BUN: 9 mg/dL (ref 7–25)
CALCIUM: 8.9 mg/dL (ref 8.6–10.3)
CHLORIDE: 105 mmol/L (ref 98–110)
CO2: 26 mmol/L (ref 20–31)
Creat: 0.84 mg/dL (ref 0.70–1.25)
GLUCOSE: 121 mg/dL — AB (ref 65–99)
POTASSIUM: 3.6 mmol/L (ref 3.5–5.3)
Sodium: 138 mmol/L (ref 135–146)
Total Bilirubin: 0.6 mg/dL (ref 0.2–1.2)
Total Protein: 7.1 g/dL (ref 6.1–8.1)

## 2016-07-07 NOTE — Patient Instructions (Addendum)
1.  Start taking Claritin 10mg  daily. 2. Continue Flonase daily. 3.  Continue cough medications for cough.    IF you received an x-ray today, you will receive an invoice from [facility]. Please contact [facility] at [phone] with questions or concerns regarding your invoice.   IF you received labwork today, you will receive an invoice from [employer]. Please contact [facility] at [phone] with questions or concerns regarding your invoice.   Our billing staff will not be able to assist you with questions regarding bills from these companies.  You will be contacted with the lab results as soon as they are available. The fastest way to get your results is to activate your My Chart account. Instructions are located on the last page of this paperwork. If you have not heard from [place] regarding the results in 2 weeks, please contact this office.    Influenza (Flu) Vaccine (Inactivated or Recombinant): What You Need to Know 1. Why get vaccinated? Influenza ("flu") is a contagious disease that spreads around the [place] every year, usually between October and May. Flu is caused by influenza viruses, and is spread mainly by coughing, sneezing, and close contact. Anyone can get flu. Flu strikes suddenly and can last several days. Symptoms vary by age, but can include:  fever/chills  sore throat  muscle aches  fatigue  cough  headache  runny or stuffy nose Flu can also lead to pneumonia and blood infections, and cause diarrhea and seizures in children. If you have a medical condition, such as heart or lung disease, flu can make it worse. Flu is more dangerous for some people. Infants and young children, people 74 years of age and older, pregnant women, and people with certain health conditions or a weakened immune system are at greatest risk. Each year thousands of people in the [place] die from flu, and many more are  hospitalized. Flu vaccine can:  keep you from getting flu,  make flu less severe if you do get it, and  keep you from spreading flu to your family and other people. 2. Inactivated and recombinant flu vaccines A dose of flu vaccine is recommended every flu season. Children 6 months through 37 years of age may need two doses during the same flu season. Everyone else needs only one dose each flu season. Some inactivated flu vaccines contain a very small amount of a mercury-based preservative called thimerosal. Studies have not shown thimerosal in vaccines to be harmful, but flu vaccines that do not contain thimerosal are available. There is no live flu virus in flu shots. They cannot cause the flu. There are many flu viruses, and they are always changing. Each year a new flu vaccine is made to protect against three or four viruses that are likely to cause disease in the upcoming flu season. But even when the vaccine doesn't exactly match these viruses, it may still provide some protection. Flu vaccine cannot prevent:  flu that is caused by a virus not covered by the vaccine, or  illnesses that look like flu but are not. It takes about 2 weeks for protection to develop after vaccination, and protection lasts through the flu season. 3. Some people should not get this vaccine Tell the person who is giving you the vaccine:  If you have any severe, life-threatening allergies. If you ever had a life-threatening allergic reaction after a dose of flu vaccine, or have a severe allergy to any part of this vaccine, you  may be advised not to get vaccinated. Most, but not all, types of flu vaccine contain a small amount of egg protein.  If you ever had Guillain-Barr Syndrome (also called GBS). Some people with a history of GBS should not get this vaccine. This should be discussed with your doctor.  If you are not feeling well. It is usually okay to get flu vaccine when you have a mild illness, but you  might be asked to come back when you feel better. 4. Risks of a vaccine reaction With any medicine, including vaccines, there is a chance of reactions. These are usually mild and go away on their own, but serious reactions are also possible. Most people who get a flu shot do not have any problems with it. Minor problems following a flu shot include:  soreness, redness, or swelling where the shot was given  hoarseness  sore, red or itchy eyes  cough  fever  aches  headache  itching  fatigue If these problems occur, they usually begin soon after the shot and last 1 or 2 days. More serious problems following a flu shot can include the following:  There may be a small increased risk of Guillain-Barre Syndrome (GBS) after inactivated flu vaccine. This risk has been estimated at 1 or 2 additional cases per million people vaccinated. This is much lower than the risk of severe complications from flu, which can be prevented by flu vaccine.  Young children who get the flu shot along with pneumococcal vaccine (PCV13) and/or DTaP vaccine at the same time might be slightly more likely to have a seizure caused by fever. Ask your doctor for more information. Tell your doctor if a child who is getting flu vaccine has ever had a seizure. Problems that could happen after any injected vaccine:  People sometimes faint after a medical procedure, including vaccination. Sitting or lying down for about 15 minutes can help prevent fainting, and injuries caused by a fall. Tell your doctor if you feel dizzy, or have vision changes or ringing in the ears.  Some people get severe pain in the shoulder and have difficulty moving the arm where a shot was given. This happens very rarely.  Any medication can cause a severe allergic reaction. Such reactions from a vaccine are very rare, estimated at about 1 in a million doses, and would happen within a few minutes to a few hours after the vaccination. As with any  medicine, there is a very remote chance of a vaccine causing a serious injury or death. The safety of vaccines is always being monitored. For more information, visit: http://www.aguilar.org/ 5. What if there is a serious reaction? What should I look for? Look for anything that concerns you, such as signs of a severe allergic reaction, very high fever, or unusual behavior. Signs of a severe allergic reaction can include hives, swelling of the face and throat, difficulty breathing, a fast heartbeat, dizziness, and weakness. These would start a few minutes to a few hours after the vaccination. What should I do?  If you think it is a severe allergic reaction or other emergency that can't wait, call 9-1-1 and get the person to the nearest hospital. Otherwise, call your doctor.  Reactions should be reported to the Vaccine Adverse Event Reporting System (VAERS). Your doctor should file this report, or you can do it yourself through the VAERS web site at www.vaers.SamedayNews.es, or by calling (979)476-7757.  VAERS does not give medical advice. 6. The National Vaccine Injury  Compensation Program The [employer] Injury Compensation Program (VICP) is a federal program that was created to compensate people who may have been injured by certain vaccines. Persons who believe they may have been injured by a vaccine can learn about the program and about filing a claim by calling (928)558-1448 or visiting the [place] website at GoldCloset.com.ee. There is a time limit to file a claim for compensation. 7. How can I learn more?  Ask your healthcare provider. He or she can give you the vaccine package insert or suggest other sources of information.  Call your local or state health department.  Contact the Centers for Disease Control and Prevention (CDC):  Call 603-611-0282 (1-800-CDC-INFO) or  Visit CDC's website at https://gibson.com/ Vaccine Information Statement, Inactivated Influenza Vaccine  (03/30/2014) This information is not intended to replace advice given to you by your health care provider. Make sure you discuss any questions you have with your health care provider. Document Released: 06/04/2006 Document Revised: 04/30/2016 Document Reviewed: 04/30/2016 Elsevier Interactive Patient Education  2017 [employer].

## 2016-07-07 NOTE — Progress Notes (Signed)
Subjective:    Patient ID: RAMBO FAZEKAS, male    DOB: 1950/11/08, 65 y.o.   MRN: [id number]  07/07/2016  Hypertension and Prediabetes   HPI This 65 y.o. male presents for six month follow-up of hypertension, DMII new onset, allergic rhinitis, history of prostate cancer, OSA on CPAP.  Home BP running 124/74-130/80s.  Patient reports good compliance with medication, good tolerance to medication, and good symptom control.    Mother with dementia new onset.  Mother doing fairly well.  Has family in the area; stopped from driving.  Having chest congestion.  Taking Alkeseltzer Cold Plus; cold medication.  Onset last week; no fever/chills/sweats. No headache; no ear pain; no sore thrat. +nasal congestion.  No sneezing.  No SOB.  Mild sputum; clear sputum.     Wt Readings from Last 3 Encounters:  07/07/16 227 lb 12.8 oz (103.3 kg)  05/17/16 219 lb (99.3 kg)  12/24/15 226 lb 12.8 oz (102.9 kg)   BP Readings from Last 3 Encounters:  07/07/16 (!) 150/96  05/17/16 (!) 166/98  12/24/15 (!) 154/85    Review of Systems  Constitutional: Negative for activity change, appetite change, chills, diaphoresis, fatigue and fever.  HENT: Positive for congestion, postnasal drip, rhinorrhea, sneezing and voice change. Negative for ear pain, sinus pain, sinus pressure, sore throat and trouble swallowing.   Respiratory: Negative for cough and shortness of breath.   Cardiovascular: Negative for chest pain, palpitations and leg swelling.  Gastrointestinal: Negative for abdominal pain, diarrhea, nausea and vomiting.  Endocrine: Negative for cold intolerance, heat intolerance, polydipsia, polyphagia and polyuria.  Skin: Negative for color change, rash and wound.  Neurological: Negative for dizziness, tremors, seizures, syncope, facial asymmetry, speech difficulty, weakness, light-headedness, numbness and headaches.  Psychiatric/Behavioral: Negative for dysphoric mood and sleep disturbance. The patient is not  nervous/anxious.     Past Medical History:  Diagnosis Date  . Allergic rhinitis, cause unspecified   . Allergy   . Cancer [facility])    Prostate cancer  . Chicken pox    childhood  . Essential hypertension, benign   . Hematuria, unspecified   . Impotence of organic origin   . Measles    childhood  . Mumps    childhood  . Nonspecific abnormal electrocardiogram (ECG) (EKG)   . Obstructive sleep apnea (adult) (pediatric)    CPAP  . Other abnormal glucose   . Other B-complex deficiencies   . Personal history of malignant neoplasm of prostate   . Personal history of urinary calculi   . Pure hypercholesterolemia   . Unspecified urinary incontinence   . Unspecified vitamin D deficiency    Past Surgical History:  Procedure Laterality Date  . COLONOSCOPY  01/22/2013   normal.  [facility].  Repeat 5 years.  . ELBOW SURGERY    . PROSTATECTOMY  2005  . urological surgery  07/2009   No Known Allergies  Social History   Social History  . Marital status: Married    Spouse name: N/A  . Number of children: 2  . Years of education: N/A   Occupational History  . [employer]    Social History Main Topics  . Smoking status: Never Smoker  . Smokeless tobacco: Never Used  . Alcohol use 0.0 oz/week     Comment: occasional drinks beer 1 per week  . Drug use: No  . Sexual activity: Yes    Partners: Female   Other Topics Concern  . Not on file   Social  History Narrative   Marital status:  Married x 74 years happily married.      Children:  2 children; 4 grandchildren.      Lives: with wife.  Children local.      Employment: [employer] x 45 years.  Plans to retire unknown.       Tobacco:  Never.      Alcohol: [place] Wine 0-1 glass per day.      Drugs: none      Exercise: moderate, walking and running daily for 15 minutes.      Seatbelt:  Always uses seat belts.       Smoke alarm and carbon monoxide detector in the home.      Caffeine use: none.       Guns:  1 gun  loaded; others unloaded.         Family History  Problem Relation Age of Onset  . Heart disease Father 74    AMI age 49; CM.  Marland Kitchen Hypertension Father   . Mental illness Sister     home in [place]  . Alcohol abuse Sister   . Hypertension Mother   . Heart disease Mother     AMI years ago; no CABG/stents.  . Arthritis Mother   . Heart disease Brother 63    AMI; CM       Objective:    BP (!) 150/96 (BP Location: Right Arm, Cuff Size: Large)   Pulse 71   Temp 98.5 F (36.9 C) (Oral)   Resp 16   Ht 5' 9.5" (1.765 m)   Wt 227 lb 12.8 oz (103.3 kg)   SpO2 99%   BMI 33.16 kg/m  Physical Exam  Constitutional: He is oriented to person, place, and time. He appears well-developed and well-nourished. No distress.  HENT:  Head: Normocephalic and atraumatic.  Right Ear: Tympanic membrane, external ear and ear canal normal.  Left Ear: Tympanic membrane, external ear and ear canal normal.  Nose: Mucosal edema and rhinorrhea present.  Mouth/Throat: Uvula is midline, oropharynx is clear and moist and mucous membranes are normal.  Eyes: Conjunctivae and EOM are normal. Pupils are equal, round, and reactive to light.  Neck: Normal range of motion. Neck supple. Carotid bruit is not present. No thyromegaly present.  Cardiovascular: Normal rate, regular rhythm, normal heart sounds and intact distal pulses.  Exam reveals no gallop and no friction rub.   No murmur heard. Pulmonary/Chest: Effort normal and breath sounds normal. He has no wheezes. He has no rales.  Abdominal: Soft. Bowel sounds are normal. He exhibits no distension and no mass. There is no tenderness. There is no rebound and no guarding.  Lymphadenopathy:    He has no cervical adenopathy.  Neurological: He is alert and oriented to person, place, and time. No cranial nerve deficit.  Skin: Skin is warm and dry. No rash noted. He is not diaphoretic.  Psychiatric: He has a normal mood and affect. His behavior is normal.  Nursing note and  vitals reviewed.       Assessment & Plan:   1. Essential hypertension, benign   2. Other abnormal glucose   3. H/O malignant neoplasm of prostate   4. Chronic seasonal allergic rhinitis due to pollen   5. ASNHL (asymmetrical sensorineural hearing loss)   6. Needs flu shot    -white coat syndrome with persistently elevated blood pressures in office; continue current medications; obtain labs. -recommend weight loss, exercise, and low-carbohydrate food choices for glucose intolerance. -  recommend daily antihistamine and daily Flonase; call if clinically worsens in upcoming week; no evidence of acute sinusitis at this time. -s/p recent follow-up with [facility].  Orders Placed This Encounter  Procedures  . Flu Vaccine QUAD 36+ mos IM  . CBC with Differential/Platelet  . Comprehensive metabolic panel  . Hemoglobin A1c   No orders of the defined types were placed in this encounter.   Return in about 4 months (around 11/04/2016) for recheck.   Yatzari Jonsson Elayne Guerin, M.D. [facility] [address] [place], [place]  [address] [phone] phone [phone] fax

## 2016-07-08 LAB — HEMOGLOBIN A1C
Hgb A1c MFr Bld: 6.1 % — ABNORMAL HIGH (ref <5.7)
MEAN PLASMA GLUCOSE: 128 mg/dL

## 2016-09-20 ENCOUNTER — Ambulatory Visit
Admission: EM | Admit: 2016-09-20 | Discharge: 2016-09-20 | Disposition: A | Discharge status: Home / Self Care | Payer: 59 | Attending: Family Medicine | Admitting: Family Medicine

## 2016-09-20 DIAGNOSIS — L03213 Periorbital cellulitis: Secondary | ICD-10-CM | POA: Diagnosis not present

## 2016-09-20 MED ORDER — CLINDAMYCIN HCL 300 MG PO CAPS: 300.0000 mg | ORAL_CAPSULE | Freq: Three times a day (TID) | ORAL | 0 refills | Status: DC

## 2016-09-20 NOTE — ED Provider Notes (Signed)
[facility]    CSN: [id number] Arrival date & time: 09/20/16  1341     History   Chief Complaint Chief Complaint  Patient presents with  . Eye Problem    HPI Juan Chavez is a 66 y.o. male.   The history is provided by the patient.  Eye Problem  Location:  Left eye Quality:  Tearing Severity:  Moderate Onset quality:  Sudden Duration:  2 days Timing:  Constant Progression:  Worsening Chronicity:  New Relieved by:  Nothing Ineffective treatments:  Eye drops   Past Medical History:  Diagnosis Date  . Allergic rhinitis, cause unspecified   . Allergy   . Cancer [facility])    Prostate cancer  . Chicken pox    childhood  . Essential hypertension, benign   . Hematuria, unspecified   . Impotence of organic origin   . Measles    childhood  . Mumps    childhood  . Nonspecific abnormal electrocardiogram (ECG) (EKG)   . Obstructive sleep apnea (adult) (pediatric)    CPAP  . Other abnormal glucose   . Other B-complex deficiencies   . Personal history of malignant neoplasm of prostate   . Personal history of urinary calculi   . Pure hypercholesterolemia   . Unspecified urinary incontinence   . Unspecified vitamin D deficiency     Patient Active Problem List   Diagnosis Date Noted  . ASNHL (asymmetrical sensorineural hearing loss) 12/17/2015  . Obesity (BMI 30.0-34.9) 07/03/2014  . H/O malignant neoplasm of prostate 12/30/2012  . 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 Other abnormal glucose 11/14/2012  . Unspecified vitamin D deficiency 11/14/2012  . Allergic rhinitis 11/14/2012  . Essential hypertension, benign 08/02/2012  . Genuine stress incontinence, male 07/26/2012    Past Surgical History:  Procedure Laterality Date  . COLONOSCOPY  01/22/2013   normal.  [facility].  Repeat 5 years.  . ELBOW SURGERY    . PROSTATECTOMY  2005  . urological surgery  07/2009       Home Medications    Prior to Admission medications   Medication Sig Start Date End  Date Taking? Authorizing Provider  aspirin 81 MG tablet Take 81 mg by mouth daily.    Historical Provider, MD  azelastine (ASTELIN) 0.1 % nasal spray Place 2 sprays into both nostrils 2 (two) times daily. Use in each nostril as directed Patient not taking: Reported on 07/07/2016 12/24/15   [name], MD  cholecalciferol (VITAMIN D) 1000 UNITS tablet Take 1,000 Units by mouth 2 (two) times daily.    Historical Provider, MD  clindamycin (CLEOCIN) 300 MG capsule Take 1 capsule (300 mg total) by mouth 3 (three) times daily. 09/20/16   Norval Gable, MD  cloNIDine (CATAPRES) 0.1 MG tablet Take 1 tablet (0.1 mg total) by mouth 3 (three) times daily. 12/24/15   [name], MD  Cyanocobalamin (VITAMIN B-12 PO) Take by mouth daily.    Historical Provider, MD  cyclobenzaprine (FLEXERIL) 10 MG tablet Take 1 tablet (10 mg total) by mouth 3 (three) times daily as needed for muscle spasms. 05/17/16   Nena Polio, MD  diltiazem (CARDIZEM CD) 180 MG 24 hr capsule Take 1 capsule (180 mg total) by mouth 2 (two) times daily. 12/24/15   [name], MD  ELMIRON 100 MG capsule TAKE 1 CAPSULE (100 MG TOTAL) BY MOUTH DAILY. Patient not taking: Reported on 07/07/2016 09/12/13   [name], MD  enalapril (VASOTEC) 20 MG tablet Take 1  tablet (20 mg total) by mouth 2 (two) times daily. 12/24/15   [name], MD  fluticasone (FLONASE) 50 MCG/ACT nasal spray PLACE 2 SPRAYS INTO BOTH NOSTRILS DAILY. 12/24/15   [name], MD  loratadine (CLARITIN) 10 MG tablet TAKE 1 TABLET (10 MG TOTAL) BY MOUTH DAILY. 12/24/15   [name], MD  MYRBETRIQ 50 MG TB24 Take 1 tablet by mouth daily. For urinary incontinence 07/26/12   Historical Provider, MD  tadalafil (CIALIS) 20 MG tablet TAKE 1 TABLET (20 MG TOTAL) BY MOUTH DAILY AS NEEDED FOR ERECTILE DYSFUNCTION. 12/24/15   [name], MD  Zoster Vaccine Live, PF, (ZOSTAVAX) 29562 UNT/0.65ML injection Inject 19,400 Units into the skin once. Patient not taking: Reported on  07/07/2016 12/24/15   [name], MD    Family History Family History  Problem Relation Age of Onset  . Heart disease Father 61    AMI age 46; CM.  Marland Kitchen Hypertension Father   . Mental illness Sister     home in [place]  . Alcohol abuse Sister   . Hypertension Mother   . Heart disease Mother     AMI years ago; no CABG/stents.  . Arthritis Mother   . Heart disease Brother 13    AMI; CM    Social History Social History  Substance Use Topics  . Smoking status: Never Smoker  . Smokeless tobacco: Never Used  . Alcohol use 0.0 oz/week     Comment: occasional drinks beer 1 per week     Allergies   Patient has no known allergies.   Review of Systems Review of Systems   Physical Exam Triage Vital Signs ED Triage Vitals  Enc Vitals Group     BP 09/20/16 1413 (!) 165/88     Pulse Rate 09/20/16 1413 66     Resp 09/20/16 1413 20     Temp 09/20/16 1413 98.6 F (37 C)     Temp Source 09/20/16 1413 Oral     SpO2 09/20/16 1413 99 %     Weight 09/20/16 1415 223 lb (101.2 kg)     Height 09/20/16 1415 5\' 11"  (1.803 m)     Head Circumference --      Peak Flow --      Pain Score --      Pain Loc --      Pain Edu? --      Excl. in [place]? --    No data found.   Updated Vital Signs BP (!) 165/88 (BP Location: Left Arm)   Pulse 66   Temp 98.6 F (37 C) (Oral)   Resp 20   Ht 5\' 11"  (1.803 m)   Wt 223 lb (101.2 kg)   SpO2 99%   BMI 31.10 kg/m   Visual Acuity Right Eye Distance:  (20/30 with glasses) Left Eye Distance:  (20/30 -1 with glasses) Bilateral Distance:  (20/25 with glasses)  Right Eye Near:   Left Eye Near:    Bilateral Near:     Physical Exam  Constitutional: He appears well-developed and well-nourished. No distress.  Eyes: Conjunctivae and EOM are normal. Pupils are equal, round, and reactive to light. Lids are everted and swept, no foreign bodies found. Right eye exhibits no discharge. Left eye exhibits no discharge.  Left upper eyelid with mild-moderate  edema, erythema and mild tenderness to palpation  Skin: He is not diaphoretic.  Nursing note and vitals reviewed.    UC Treatments / Results  Labs (  all labs ordered are listed, but only abnormal results are displayed) Labs Reviewed - No data to display  EKG  EKG Interpretation None       Radiology No results found.  Procedures Procedures (including critical care time)  Medications Ordered in UC Medications - No data to display   Initial Impression / Assessment and Plan / UC Course  I have reviewed the triage vital signs and the nursing notes.  Pertinent labs & imaging results that were available during my care of the patient were reviewed by me and considered in my medical decision making (see chart for details).       Final Clinical Impressions(s) / UC Diagnoses   Final diagnoses:  Preseptal cellulitis  (left eyelid)  New Prescriptions Discharge Medication List as of 09/20/2016  3:22 PM    START taking these medications   Details  clindamycin (CLEOCIN) 300 MG capsule Take 1 capsule (300 mg total) by mouth 3 (three) times daily., Starting Sun 09/20/2016, Normal       1. diagnosis reviewed with patient 2. rx as per orders above; reviewed possible side effects, interactions, risks and benefits  3. Recommend supportive treatment with warm compresses 4. Follow-up prn if symptoms worsen or don't improve   Norval Gable, MD 09/20/16 1527

## 2016-09-20 NOTE — ED Triage Notes (Addendum)
Pt with left upper eyelid swelling and feels a little scratchy. Had some pus drainage "in the corner of my eye." Denies pain. States he was using a Teacher, English as a foreign language

## 2016-10-21 ENCOUNTER — Ambulatory Visit: Payer: 59 | Admitting: Family Medicine

## 2016-10-27 ENCOUNTER — Encounter: Payer: Self-pay | Admitting: Family Medicine

## 2016-10-27 ENCOUNTER — Ambulatory Visit (INDEPENDENT_AMBULATORY_CARE_PROVIDER_SITE_OTHER): Payer: 59 | Admitting: Family Medicine

## 2016-10-27 VITALS — BP 160/90 | HR 60 | Temp 98.2°F | Resp 18 | Ht 71.0 in | Wt 225.0 lb

## 2016-10-27 DIAGNOSIS — J301 Allergic rhinitis due to pollen: Secondary | ICD-10-CM | POA: Diagnosis not present

## 2016-10-27 DIAGNOSIS — J22 Unspecified acute lower respiratory infection: Secondary | ICD-10-CM

## 2016-10-27 DIAGNOSIS — M7022 Olecranon bursitis, left elbow: Secondary | ICD-10-CM

## 2016-10-27 DIAGNOSIS — Z23 Encounter for immunization: Secondary | ICD-10-CM

## 2016-10-27 DIAGNOSIS — I10 Essential (primary) hypertension: Secondary | ICD-10-CM

## 2016-10-27 DIAGNOSIS — R7309 Other abnormal glucose: Secondary | ICD-10-CM | POA: Diagnosis not present

## 2016-10-27 DIAGNOSIS — H905 Unspecified sensorineural hearing loss: Secondary | ICD-10-CM

## 2016-10-27 DIAGNOSIS — Z636 Dependent relative needing care at home: Secondary | ICD-10-CM

## 2016-10-27 DIAGNOSIS — N3040 Irradiation cystitis without hematuria: Secondary | ICD-10-CM | POA: Diagnosis not present

## 2016-10-27 DIAGNOSIS — H903 Sensorineural hearing loss, bilateral: Secondary | ICD-10-CM

## 2016-10-27 MED ORDER — ALPRAZOLAM 0.5 MG PO TABS: 0.5000 mg | ORAL_TABLET | Freq: Every day | ORAL | 0 refills | Status: DC | PRN

## 2016-10-27 MED ORDER — DOXYCYCLINE HYCLATE 100 MG PO CAPS: 100.0000 mg | ORAL_CAPSULE | Freq: Two times a day (BID) | ORAL | 0 refills | Status: DC

## 2016-10-27 NOTE — Patient Instructions (Addendum)
1.  Increase Clonidine 0.1mg  to one tablet three times daily. 2.  Check blood pressure once daily.     IF you received an x-ray today, you will receive an invoice from [facility]. Please contact [facility] at [phone] with questions or concerns regarding your invoice.   IF you received labwork today, you will receive an invoice from [facility]. Please contact [facility] at [phone] with questions or concerns regarding your invoice.   Our billing staff will not be able to assist you with questions regarding bills from these companies.  You will be contacted with the lab results as soon as they are available. The fastest way to get your results is to activate your My Chart account. Instructions are located on the last page of this paperwork. If you have not heard from [place] regarding the results in 2 weeks, please contact this office.       Elbow Bursitis Rehab Ask your health care provider which exercises are safe for you. Do exercises exactly as told by your health care provider and adjust them as directed. It is normal to feel mild stretching, pulling, tightness, or discomfort as you do these exercises, but you should stop right away if you feel sudden pain or your pain gets worse. Do not begin these exercises until told by your health care provider. Stretching and range of motion exercises These exercises warm up your muscles and joints and improve the movement and flexibility of your elbow. These exercises also help to relieve pain and swelling. Exercise A: Passive elbow flexion   1. Lie on your back. 2. Extend your left / right arm up into the air, bracing it with your other hand. Allow your left / right arm to relax. 3. Let your left / right elbow bend, allowing your hand to fall slowly toward your chest. You should feel a gentle stretch along the back of your upper arm and elbow. 4. If told by your health care provider, hold a __________ hand weight to increase  the intensity of this stretch. 5. Hold this position for __________ seconds. 6. Slowly return your left / right arm to the upright position. Repeat __________ times. Complete this exercise __________ times a day. Exercise B: Passive elbow extension   1. Lie on your back on a firm bed. Make sure that you are in a comfortable position that allows you relax your arm muscles. 2. Place a folded towel under your left / right upper arm so that your elbow and shoulder are at the same height. 3. Extend your left / right arm so your elbow and hand do not rest on the bed or towel. 4. Let the weight of your hand straighten your elbow. Keep your arm and chest muscles relaxed. You should feel a stretch on the inside of your elbow. 5. If told by your health care provider, increase the intensity of your stretch by adding a small wrist weight or hand weight. 6. Hold this position for __________ seconds. 7. Slowly return to the starting position. Repeat __________ times. Complete this exercise __________ times a day. Strengthening exercises These exercises build strength and endurance in your elbow. Endurance is the ability to use your muscles for a long time, even after they get tired. Exercise C: Elbow extensors, isometric   1. Stand or sit upright on a firm surface. 2. Place your left / right arm so your palm faces your abdomen, at the height of your waist. 3. Place your other hand on the  underside of your forearm. Slowly push your left / right arm down, like you were going to straighten your elbow. Resist with your other hand. Push as hard as you can without causing any pain or movement at your left / right elbow. 4. Hold this position for __________ seconds. 5. Slowly release the tension in both arms. 6. Let your muscles relax completely before repeating. Repeat __________ times. Complete this exercise __________ times a day. Exercise D: Elbow flexors, isometric  1. Stand or sit upright on a firm  surface. 2. Place your left / right arm so your hand is palm-up, at the height of your waist. 3. Place your other hand on top of your forearm. Gently push up with your left / right arm, like you were going to bend your elbow. Resist this motion with your other hand. Push as hard as you can without causing any pain or movement at your left / right elbow. 4. Hold this position for __________ seconds. 5. Slowly release the tension in both arms. 6. Let your muscles relax completely before repeating. Repeat __________ times. Complete this exercise __________ times a day. This information is not intended to replace advice given to you by your health care provider. Make sure you discuss any questions you have with your health care provider. Document Released: 08/10/2005 Document Revised: 04/14/2016 Document Reviewed: 05/09/2015 Elsevier Interactive Patient Education  2017 [employer].

## 2016-10-27 NOTE — Progress Notes (Signed)
Subjective:    Patient ID: Juan Chavez, male    DOB: [birth year], 66 y.o.   MRN: [id number]  10/27/2016  Follow-up (4 month BP check )   HPI This 66 y.o. male presents for four month follow-up of hypertension, glucose intolerance, history of prostate cancer, allergic rhinitis, OSA on CPAP.  Patient reports good compliance with medication, good tolerance to medication, and good symptom control.  Not checking blood pressure at home.    Chest congestion: onset three weeks ago.  No fever/chills/sweats.  No headache; no ear pain.  No sore throat.  +nasal congestion; clear; no rhinorrhea; +PND.  +chest congestion; +coughing mild to moderate; +sputum production.  No horrible coughing spells.  Taking Robitussin, Delsym, Cortisedin.  Taking Claritin and Flonase daily; no sneezing.  No sinus pressure.   L elbow swelling: no trauma; small swelling. No pain. Similar symptoms years ago and underwent aspiration. No warmth; no fever.  Immunization History  Administered Date(s) Administered  . Influenza Split 04/24/2012  . Influenza,inj,Quad PF,36+ Mos 05/29/2013, 07/02/2014, 07/07/2016  . Influenza-Unspecified 05/09/2015  . Pneumococcal Conjugate-13 09/02/2015  . Pneumococcal Polysaccharide-23 10/27/2016  . Tdap 08/25/2007   BP Readings from Last 3 Encounters:  10/27/16 (!) 160/90  09/20/16 (!) 165/88  07/07/16 (!) 150/96   Wt Readings from Last 3 Encounters:  10/27/16 225 lb (102.1 kg)  09/20/16 223 lb (101.2 kg)  07/07/16 227 lb 12.8 oz (103.3 kg)     Review of Systems  Constitutional: Negative for activity change, appetite change, chills, diaphoresis, fatigue and fever.  HENT: Positive for congestion, postnasal drip and rhinorrhea. Negative for ear pain, sinus pain, sinus pressure, sore throat, trouble swallowing and voice change.   Eyes: Negative for visual disturbance.  Respiratory: Positive for cough. Negative for shortness of breath.   Cardiovascular: Negative for chest pain,  palpitations and leg swelling.  Endocrine: Negative for cold intolerance, heat intolerance, polydipsia, polyphagia and polyuria.  Musculoskeletal: Positive for joint swelling.  Neurological: Negative for dizziness, tremors, seizures, syncope, facial asymmetry, speech difficulty, weakness, light-headedness, numbness and headaches.    Past Medical History:  Diagnosis Date  . Allergic rhinitis, cause unspecified   . Allergy   . Cancer [facility])    Prostate cancer  . Chicken pox    childhood  . Essential hypertension, benign   . Hematuria, unspecified   . Impotence of organic origin   . Measles    childhood  . Mumps    childhood  . Nonspecific abnormal electrocardiogram (ECG) (EKG)   . Obstructive sleep apnea (adult) (pediatric)    CPAP  . Other abnormal glucose   . Other B-complex deficiencies   . Personal history of malignant neoplasm of prostate   . Personal history of urinary calculi   . Pure hypercholesterolemia   . Unspecified urinary incontinence   . Unspecified vitamin D deficiency    Past Surgical History:  Procedure Laterality Date  . COLONOSCOPY  01/22/2013   normal.  [facility].  Repeat 5 years.  . ELBOW SURGERY    . PROSTATECTOMY  2005  . urological surgery  07/2009   No Known Allergies  Social History   Social History  . Marital status: Married    Spouse name: N/A  . Number of children: 2  . Years of education: N/A   Occupational History  . [employer]    Social History Main Topics  . Smoking status: Never Smoker  . Smokeless tobacco: Never Used  . Alcohol use 0.0 oz/week  Comment: occasional drinks beer 1 per week  . Drug use: No  . Sexual activity: Yes    Partners: Female   Other Topics Concern  . Not on file   Social History Narrative   Marital status:  Married x 54 years happily married.      Children:  2 children; 4 grandchildren.      Lives: with wife.  Children local.      Employment: [employer] x 45 years.  Plans to  retire unknown.       Tobacco:  Never.      Alcohol: [place] Wine 0-1 glass per day.      Drugs: none      Exercise: moderate, walking and running daily for 15 minutes.      Seatbelt:  Always uses seat belts.       Smoke alarm and carbon monoxide detector in the home.      Caffeine use: none.       Guns:  1 gun loaded; others unloaded.         Family History  Problem Relation Age of Onset  . Heart disease Father 51    AMI age 52; CM.  Marland Kitchen Hypertension Father   . Mental illness Sister     home in [place]  . Alcohol abuse Sister   . Hypertension Mother   . Heart disease Mother     AMI years ago; no CABG/stents.  . Arthritis Mother   . Heart disease Brother 88    AMI; CM       Objective:    BP (!) 160/90 (BP Location: Left Arm, Patient Position: Sitting)   Pulse 60   Temp 98.2 F (36.8 C) (Oral)   Resp 18   Ht 5\' 11"  (1.803 m)   Wt 225 lb (102.1 kg)   SpO2 99%   BMI 31.38 kg/m  Physical Exam  Constitutional: He is oriented to person, place, and time. He appears well-developed and well-nourished. No distress.  HENT:  Head: Normocephalic and atraumatic.  Right Ear: External ear normal.  Left Ear: External ear normal.  Nose: Nose normal.  Mouth/Throat: Oropharynx is clear and moist.  Eyes: Conjunctivae and EOM are normal. Pupils are equal, round, and reactive to light.  Neck: Normal range of motion. Neck supple. Carotid bruit is not present. No thyromegaly present.  Cardiovascular: Normal rate, regular rhythm, normal heart sounds and intact distal pulses.  Exam reveals no gallop and no friction rub.   No murmur heard. Pulmonary/Chest: Effort normal and breath sounds normal. He has no wheezes. He has no rales.  Abdominal: Soft. Bowel sounds are normal. He exhibits no distension and no mass. There is no tenderness. There is no rebound and no guarding.  Musculoskeletal:       Left elbow: He exhibits normal range of motion and no laceration. No tenderness found. No radial head  and no lateral epicondyle tenderness noted.  +localized swelling at L olecranon process; non-tender; no warmth.  Lymphadenopathy:    He has no cervical adenopathy.  Neurological: He is alert and oriented to person, place, and time. No cranial nerve deficit.  Skin: Skin is warm and dry. No rash noted. He is not diaphoretic.  Psychiatric: He has a normal mood and affect. His behavior is normal.  Nursing note and vitals reviewed.  Depression screen [facility] 2/9 10/27/2016 07/07/2016 12/24/2015 12/24/2015 11/13/2015  Decreased Interest 0 0 0 0 0  Down, Depressed, Hopeless 0 0 0 0 0  PHQ -  2 Score 0 0 0 0 0   Fall Risk  10/27/2016 07/07/2016 12/24/2015 12/24/2015 09/02/2015  Falls in the past year? No No No No No        Assessment & Plan:   1. Essential hypertension, benign   2. Chronic seasonal allergic rhinitis due to pollen   3. ASNHL (asymmetrical sensorineural hearing loss)   4. Cystitis, radiation   5. Other abnormal glucose   6. Lower respiratory infection   7. Olecranon bursitis of left elbow   8. Need for prophylactic vaccination against Streptococcus pneumoniae (pneumococcus)   9. Caregiver stress    -New onset lower respiratory infection for three weeks without improvement; treat with Doxycycline; continue Mucinex DM -blood pressure uncontrolled; pt non-compliant with tid Clonidine; increase Clonidine to tid. -s/p Pneumovax -obtain labs. -rx for Xanax provided; suffering with caregiver stress due to mother with dementia. -new onset L olecranon bursitis; reassurance provided; asymptomatic thus do not recommend intervention; recommend rest, icing, home exercise program, NSAIDs.   Orders Placed This Encounter  Procedures  . Pneumococcal polysaccharide vaccine 23-valent greater than or equal to 2yo subcutaneous/IM  . CBC with Differential/Platelet  . Comprehensive metabolic panel  . Hemoglobin A1c   Meds ordered this encounter  Medications  . ALPRAZolam (XANAX) 0.5 MG tablet    Sig:  Take 1 tablet (0.5 mg total) by mouth daily as needed for anxiety.    Dispense:  30 tablet    Refill:  0  . doxycycline (VIBRAMYCIN) 100 MG capsule    Sig: Take 1 capsule (100 mg total) by mouth 2 (two) times daily.    Dispense:  20 capsule    Refill:  0    Return in about 3 months (around 01/27/2017) for complete physical examiniation.   Markevious Ehmke Elayne Guerin, M.D. Primary Care at [facility] [place] [address] [place], [place]  [phone] phone [phone] fax

## 2016-10-28 LAB — COMPREHENSIVE METABOLIC PANEL
A/G RATIO: 1.4 (ref 1.2–2.2)
ALT: 25 IU/L (ref 0–44)
AST: 21 IU/L (ref 0–40)
Albumin: 4.3 g/dL (ref 3.6–4.8)
Alkaline Phosphatase: 86 IU/L (ref 39–117)
BUN/Creatinine Ratio: 14 (ref 10–24)
BUN: 12 mg/dL (ref 8–27)
Bilirubin Total: 0.5 mg/dL (ref 0.0–1.2)
CALCIUM: 8.8 mg/dL (ref 8.6–10.2)
CO2: 25 mmol/L (ref 18–29)
Chloride: 101 mmol/L (ref 96–106)
Creatinine, Ser: 0.83 mg/dL (ref 0.76–1.27)
GFR, EST AFRICAN AMERICAN: 106 mL/min/{1.73_m2} (ref >59)
GFR, EST NON AFRICAN AMERICAN: 92 mL/min/{1.73_m2} (ref >59)
Globulin, Total: 3 g/dL (ref 1.5–4.5)
Glucose: 93 mg/dL (ref 65–99)
Potassium: 3.6 mmol/L (ref 3.5–5.2)
Sodium: 141 mmol/L (ref 134–144)
TOTAL PROTEIN: 7.3 g/dL (ref 6.0–8.5)

## 2016-10-28 LAB — CBC WITH DIFFERENTIAL/PLATELET
BASOS: 1 %
Basophils Absolute: 0.1 10*3/uL (ref 0.0–0.2)
EOS (ABSOLUTE): 0.1 10*3/uL (ref 0.0–0.4)
EOS: 2 %
HEMATOCRIT: 38.2 % (ref 37.5–51.0)
HEMOGLOBIN: 13.7 g/dL (ref 13.0–17.7)
IMMATURE GRANS (ABS): 0 10*3/uL (ref 0.0–0.1)
IMMATURE GRANULOCYTES: 0 %
LYMPHS: 27 %
Lymphocytes Absolute: 2.2 10*3/uL (ref 0.7–3.1)
MCH: 29.5 pg (ref 26.6–33.0)
MCHC: 35.9 g/dL — ABNORMAL HIGH (ref 31.5–35.7)
MCV: 82 fL (ref 79–97)
MONOCYTES: 5 %
Monocytes Absolute: 0.4 10*3/uL (ref 0.1–0.9)
NEUTROS PCT: 65 %
Neutrophils Absolute: 5.3 10*3/uL (ref 1.4–7.0)
PLATELETS: 179 10*3/uL (ref 150–379)
RBC: 4.64 x10E6/uL (ref 4.14–5.80)
RDW: 14.1 % (ref 12.3–15.4)
WBC: 8 10*3/uL (ref 3.4–10.8)

## 2016-10-28 LAB — HEMOGLOBIN A1C
Est. average glucose Bld gHb Est-mCnc: 128 mg/dL
Hgb A1c MFr Bld: 6.1 % — ABNORMAL HIGH (ref 4.8–5.6)

## 2016-11-04 ENCOUNTER — Ambulatory Visit: Payer: 59 | Admitting: Family Medicine

## 2016-11-13 ENCOUNTER — Encounter: Payer: Self-pay | Admitting: Family Medicine

## 2016-11-17 DIAGNOSIS — Z636 Dependent relative needing care at home: Secondary | ICD-10-CM | POA: Insufficient documentation

## 2016-11-23 ENCOUNTER — Other Ambulatory Visit: Payer: Self-pay | Admitting: Family Medicine

## 2017-01-06 ENCOUNTER — Other Ambulatory Visit: Payer: Self-pay | Admitting: Family Medicine

## 2017-01-21 ENCOUNTER — Other Ambulatory Visit: Payer: Self-pay | Admitting: Family Medicine

## 2017-01-21 ENCOUNTER — Ambulatory Visit (INDEPENDENT_AMBULATORY_CARE_PROVIDER_SITE_OTHER): Payer: 59 | Admitting: Family Medicine

## 2017-01-21 ENCOUNTER — Encounter: Payer: Self-pay | Admitting: Family Medicine

## 2017-01-21 VITALS — BP 160/92 | HR 65 | Temp 97.7°F | Resp 18 | Ht 69.29 in | Wt 219.0 lb

## 2017-01-21 DIAGNOSIS — E669 Obesity, unspecified: Secondary | ICD-10-CM

## 2017-01-21 DIAGNOSIS — R7309 Other abnormal glucose: Secondary | ICD-10-CM

## 2017-01-21 DIAGNOSIS — Z636 Dependent relative needing care at home: Secondary | ICD-10-CM | POA: Diagnosis not present

## 2017-01-21 DIAGNOSIS — Z8249 Family history of ischemic heart disease and other diseases of the circulatory system: Secondary | ICD-10-CM | POA: Diagnosis not present

## 2017-01-21 DIAGNOSIS — E66811 Obesity, class 1: Secondary | ICD-10-CM

## 2017-01-21 DIAGNOSIS — M7022 Olecranon bursitis, left elbow: Secondary | ICD-10-CM | POA: Diagnosis not present

## 2017-01-21 DIAGNOSIS — Z8546 Personal history of malignant neoplasm of prostate: Secondary | ICD-10-CM

## 2017-01-21 DIAGNOSIS — H905 Unspecified sensorineural hearing loss: Secondary | ICD-10-CM | POA: Diagnosis not present

## 2017-01-21 DIAGNOSIS — Z0000 Encounter for general adult medical examination without abnormal findings: Secondary | ICD-10-CM | POA: Diagnosis not present

## 2017-01-21 DIAGNOSIS — H903 Sensorineural hearing loss, bilateral: Secondary | ICD-10-CM

## 2017-01-21 DIAGNOSIS — J301 Allergic rhinitis due to pollen: Secondary | ICD-10-CM

## 2017-01-21 DIAGNOSIS — I10 Essential (primary) hypertension: Secondary | ICD-10-CM

## 2017-01-21 LAB — POCT URINALYSIS DIP (MANUAL ENTRY)
BILIRUBIN UA: NEGATIVE
BILIRUBIN UA: NEGATIVE mg/dL
Glucose, UA: NEGATIVE mg/dL
LEUKOCYTES UA: NEGATIVE
Nitrite, UA: NEGATIVE
PH UA: 7 (ref 5.0–8.0)
SPEC GRAV UA: 1.015 (ref 1.010–1.025)
Urobilinogen, UA: 0.2 E.U./dL

## 2017-01-21 MED ORDER — ZOSTER VAC RECOMB ADJUVANTED 50 MCG/0.5ML IM SUSR: 0.5000 mL | Freq: Once | INTRAMUSCULAR | 1 refills | Status: AC

## 2017-01-21 MED ORDER — VALSARTAN 320 MG PO TABS: 320.0000 mg | ORAL_TABLET | Freq: Every day | ORAL | 3 refills | Status: DC

## 2017-01-21 MED ORDER — FLUTICASONE PROPIONATE 50 MCG/ACT NA SUSP: NASAL | 11 refills | Status: DC

## 2017-01-21 MED ORDER — TADALAFIL 20 MG PO TABS
ORAL_TABLET | ORAL | 11 refills | Status: DC
Start: 2017-01-21 — End: 2017-10-29

## 2017-01-21 MED ORDER — LORATADINE 10 MG PO TABS: ORAL_TABLET | ORAL | 3 refills | Status: DC

## 2017-01-21 MED ORDER — CLONIDINE HCL 0.1 MG PO TABS: 0.1000 mg | ORAL_TABLET | Freq: Two times a day (BID) | ORAL | 3 refills | Status: DC

## 2017-01-21 NOTE — Progress Notes (Signed)
Subjective:    Patient ID: TJAY VELAZQUEZ, male    DOB: 20-May-1951, 66 y.o.   MRN: [id number]  01/21/2017  Annual Exam and Cyst (left elbow )   HPI This 66 y.o. male presents for Complete PHysical Examination.    Last physical:  12-29-2015 Colonoscopy:  2008Teresa Pelton 01/20/2013 PSA:  06-26-2016 Eye exam:  Every year; glasses Dental exam:  Every six months    BP Readings from Last 3 Encounters:  01/21/17 (!) 160/92  10/27/16 (!) 160/90  09/20/16 (!) 165/88   Wt Readings from Last 3 Encounters:  01/21/17 219 lb (99.3 kg)  10/27/16 225 lb (102.1 kg)  09/20/16 223 lb (101.2 kg)   Immunization History  Administered Date(s) Administered  . Influenza Split 04/24/2012  . Influenza,inj,Quad PF,36+ Mos 05/29/2013, 07/02/2014, 07/07/2016  . Influenza-Unspecified 05/09/2015  . Pneumococcal Conjugate-13 09/02/2015  . Pneumococcal Polysaccharide-23 10/27/2016  . Tdap 08/25/2007   L elbow olecranon bursitis: still present; now hard. No pain.  No warmth; no fever. No drainage; full ROM.  History of surgical resection of very similar abnormality.  Not sure who operated on L elbow in the past.    HTN: Patient reports good compliance with medication, good tolerance to medication, and good symptom control.  Cannot remember middle of day Clonidine 0.1mg .  Too busy to check BP at home.  Stress reaction: admits to increasing stress; wife states that patient is worrying about things he usually does not worry about; he agrees.  Taking Xanax as needed and sparingly.  Denies SI; denies insomnia.  Pt is not ready for additional medication.   Review of Systems  Constitutional: Negative for activity change, appetite change, chills, diaphoresis, fatigue, fever and unexpected weight change.  HENT: Negative for congestion, dental problem, drooling, ear discharge, ear pain, facial swelling, hearing loss, mouth sores, nosebleeds, postnasal drip, rhinorrhea, sinus pressure, sneezing, sore throat,  tinnitus, trouble swallowing and voice change.   Eyes: Negative for photophobia, pain, discharge, redness, itching and visual disturbance.  Respiratory: Negative for apnea, cough, choking, chest tightness, shortness of breath, wheezing and stridor.   Cardiovascular: Negative for chest pain, palpitations and leg swelling.  Gastrointestinal: Negative for abdominal pain, blood in stool, constipation, diarrhea, nausea and vomiting.  Endocrine: Negative for cold intolerance, heat intolerance, polydipsia, polyphagia and polyuria.  Genitourinary: Positive for enuresis and urgency. Negative for decreased urine volume, difficulty urinating, discharge, dysuria, flank pain, frequency, genital sores, hematuria, penile pain, penile swelling, scrotal swelling and testicular pain.       Nocturia x 1.  Urinary leakage since prostate surgery.  Musculoskeletal: Positive for joint swelling. Negative for arthralgias, back pain, gait problem, myalgias, neck pain and neck stiffness.  Skin: Negative for color change, pallor, rash and wound.  Allergic/Immunologic: Negative for environmental allergies, food allergies and immunocompromised state.  Neurological: Negative for dizziness, tremors, seizures, syncope, facial asymmetry, speech difficulty, weakness, light-headedness, numbness and headaches.  Hematological: Negative for adenopathy. Does not bruise/bleed easily.  Psychiatric/Behavioral: Negative for agitation, behavioral problems, confusion, decreased concentration, dysphoric mood, hallucinations, self-injury, sleep disturbance and suicidal ideas. The patient is nervous/anxious. The patient is not hyperactive.        Bedtime 10:00; wakes up 5:00am.    Past Medical History:  Diagnosis Date  . Allergic rhinitis, cause unspecified   . Allergy   . Cancer [facility])    Prostate cancer  . Chicken pox    childhood  . Essential hypertension, benign   . Hematuria, unspecified   . Impotence of  organic origin   . Measles      childhood  . Mumps    childhood  . Nonspecific abnormal electrocardiogram (ECG) (EKG)   . Obstructive sleep apnea (adult) (pediatric)    CPAP  . Other abnormal glucose   . Other B-complex deficiencies   . Personal history of malignant neoplasm of prostate   . Personal history of urinary calculi   . Pure hypercholesterolemia   . Unspecified urinary incontinence   . Unspecified vitamin D deficiency    Past Surgical History:  Procedure Laterality Date  . COLONOSCOPY  01/22/2013   normal.  [facility].  Repeat 5 years.  . ELBOW SURGERY    . PROSTATECTOMY  2005  . urological surgery  07/2009   No Known Allergies  Social History   Social History  . Marital status: Married    Spouse name: N/A  . Number of children: 2  . Years of education: N/A   Occupational History  . [employer]    Social History Main Topics  . Smoking status: Never Smoker  . Smokeless tobacco: Never Used  . Alcohol use 0.0 oz/week     Comment: occasional drinks beer 1 per week  . Drug use: No  . Sexual activity: Yes    Partners: Female   Other Topics Concern  . Not on file   Social History Narrative   Marital status:  Married x 17 years happily married.      Children:  2 children; 4 grandchildren.      Lives: with wife.  Children local.      Employment: [employer] x 46 years.  Plans to retire unknown.       Tobacco:  Never.      Alcohol: rare wine.      Drugs: none      Exercise: moderate, walking and running daily for 15 minutes.      Seatbelt:  Always uses seat belts.       Smoke alarm and carbon monoxide detector in the home.      Caffeine use: none.       Guns:  1 gun loaded; others unloaded.         Family History  Problem Relation Age of Onset  . Heart disease Father 60       AMI age 40; CM.  Marland Kitchen Hypertension Father   . Mental illness Sister        home in [place]  . Alcohol abuse Sister   . Hypertension Mother   . Heart disease Mother        AMI years ago; no  CABG/stents.  . Arthritis Mother   . Dementia Mother   . Heart disease Brother 67       AMI; CM       Objective:    BP (!) 160/92 (BP Location: Right Arm, Cuff Size: Large)   Pulse 65   Temp 97.7 F (36.5 C) (Oral)   Resp 18   Ht 5' 9.29" (1.76 m)   Wt 219 lb (99.3 kg)   SpO2 98%   BMI 32.07 kg/m  Physical Exam  Constitutional: He is oriented to person, place, and time. He appears well-developed and well-nourished. No distress.  HENT:  Head: Normocephalic and atraumatic.  Right Ear: External ear normal.  Left Ear: External ear normal.  Nose: Nose normal.  Mouth/Throat: Oropharynx is clear and moist.  Eyes: Conjunctivae and EOM are normal. Pupils are equal, round, and reactive to light.  Neck: Normal range of motion. Neck supple. Carotid bruit is not present. No thyromegaly present.  Cardiovascular: Normal rate, regular rhythm, normal heart sounds and intact distal pulses.  Exam reveals no gallop and no friction rub.   No murmur heard. Pulmonary/Chest: Effort normal and breath sounds normal. He has no wheezes. He has no rales.  Abdominal: Soft. Bowel sounds are normal. He exhibits no distension and no mass. There is no tenderness. There is no rebound and no guarding.  Musculoskeletal:       Left elbow: He exhibits normal range of motion, no swelling and no effusion. No tenderness found. No radial head, no medial epicondyle, no lateral epicondyle and no olecranon process tenderness noted.  L elbow: +swelling at olecranon bursa.  Lymphadenopathy:    He has no cervical adenopathy.  Neurological: He is alert and oriented to person, place, and time. No cranial nerve deficit.  Skin: Skin is warm and dry. No rash noted. He is not diaphoretic.  Psychiatric: He has a normal mood and affect. His behavior is normal.  Nursing note and vitals reviewed.  Results for orders placed or performed in visit on 01/21/17  POCT urinalysis dipstick  Result Value Ref Range   Color, UA yellow  yellow   Clarity, UA clear clear   Glucose, UA negative negative mg/dL   Bilirubin, UA negative negative   Ketones, POC UA negative negative mg/dL   Spec Grav, UA 1.015 1.010 - 1.025   Blood, UA trace-intact (A) negative   pH, UA 7.0 5.0 - 8.0   Protein Ur, POC trace (A) negative mg/dL   Urobilinogen, UA 0.2 0.2 or 1.0 E.U./dL   Nitrite, UA Negative Negative   Leukocytes, UA Negative Negative   Depression screen [facility] 2/9 01/21/2017 10/27/2016 07/07/2016 12/24/2015 12/24/2015  Decreased Interest 0 0 0 0 0  Down, Depressed, Hopeless 0 0 0 0 0  PHQ - 2 Score 0 0 0 0 0   Fall Risk  01/21/2017 10/27/2016 07/07/2016 12/24/2015 12/24/2015  Falls in the past year? No No No No No       Assessment & Plan:   1. Routine physical examination   2. Essential hypertension, benign   3. Seasonal allergic rhinitis due to pollen   4. ASNHL (asymmetrical sensorineural hearing loss)   5. Other abnormal glucose   6. H/O malignant neoplasm of prostate   7. Caregiver stress   8. Olecranon bursitis of left elbow   9. Obesity (BMI 30.0-34.9)   10. Family history of cardiovascular disease    -anticipatory guidance provided --- exercise, weight loss, safe driving practices, aspirin 81mg  daily. -obtain age appropriate screening labs and labs for chronic disease management. -moderate fall risk; no evidence of depression; no evidence of hearing loss.  Discussed advanced directives and living will; also discussed end of life issues including code status.  --persistent L olecranon bursitis yet swelling is hard; history of surgical revision of olecranon bursitis in the past for single episode which is unusual; will refer to ortho for further management. -blood pressure uncontrolled; stop Enalapril and start Valsartan 320mg  daily.  Continue Cardizem and Clonidine.  Obtain labs. -suffering with ongoing caregiver stress due to mother with dementia; continue Xanax PRN; pt declined trial of SSRI or SNRI. -followed by radiation  oncology yearly yet no recent urology follow-up; recommend follow-up with urology.       Orders Placed This Encounter  Procedures  . CBC with Differential/Platelet  . Comprehensive metabolic panel    Order Specific  Question:   Has the patient fasted?    Answer:   Yes  . Hemoglobin A1c  . Lipid panel    Order Specific Question:   Has the patient fasted?    Answer:   Yes  . TSH  . AMB referral to orthopedics    Referral Priority:   Routine    Referral Type:   Consultation    Requested Specialty:   Orthopedic Surgery    Number of Visits Requested:   1  . POCT urinalysis dipstick  . EKG 12-Lead   Meds ordered this encounter  Medications  . valsartan (DIOVAN) 320 MG tablet    Sig: Take 1 tablet (320 mg total) by mouth daily.    Dispense:  90 tablet    Refill:  3  . Zoster Vac Recomb Adjuvanted [facility]) injection    Sig: Inject 0.5 mLs into the muscle once.    Dispense:  0.5 mL    Refill:  1  . cloNIDine (CATAPRES) 0.1 MG tablet    Sig: Take 1 tablet (0.1 mg total) by mouth 2 (two) times daily.    Dispense:  180 tablet    Refill:  3  . fluticasone (FLONASE) 50 MCG/ACT nasal spray    Sig: PLACE 2 SPRAYS INTO BOTH NOSTRILS DAILY.    Dispense:  16 g    Refill:  11  . loratadine (CLARITIN) 10 MG tablet    Sig: TAKE 1 TABLET (10 MG TOTAL) BY MOUTH DAILY.    Dispense:  90 tablet    Refill:  3  . tadalafil (CIALIS) 20 MG tablet    Sig: TAKE 1 TABLET (20 MG TOTAL) BY MOUTH DAILY AS NEEDED FOR ERECTILE DYSFUNCTION.    Dispense:  8 tablet    Refill:  11    Return in about 3 months (around 04/23/2017) for recheck  high blood pressure.   Sharesa Kemp Elayne Guerin, M.D. Primary Care at [facility] [place] [address] [place], [place]  [phone] phone [phone] fax

## 2017-01-21 NOTE — Patient Instructions (Addendum)
   IF you received an x-ray today, you will receive an invoice from [facility]. Please contact [facility] at [phone] with questions or concerns regarding your invoice.   IF you received labwork today, you will receive an invoice from [facility]. Please contact [facility] at [phone] with questions or concerns regarding your invoice.   Our billing staff will not be able to assist you with questions regarding bills from these companies.  You will be contacted with the lab results as soon as they are available. The fastest way to get your results is to activate your My Chart account. Instructions are located on the last page of this paperwork. If you have not heard from us regarding the results in 2 weeks, please contact this office.      Preventive Care 65 Years and Older, Male Preventive care refers to lifestyle choices and visits with your health care provider that can promote health and wellness. What does preventive care include?  A yearly physical exam. This is also called an annual well check.  Dental exams once or twice a year.  Routine eye exams. Ask your health care provider how often you should have your eyes checked.  Personal lifestyle choices, including: ? Daily care of your teeth and gums. ? Regular physical activity. ? Eating a healthy diet. ? Avoiding tobacco and drug use. ? Limiting alcohol use. ? Practicing safe sex. ? Taking low doses of aspirin every day. ? Taking vitamin and mineral supplements as recommended by your health care provider. What happens during an annual well check? The services and screenings done by your health care provider during your annual well check will depend on your age, overall health, lifestyle risk factors, and family history of disease. Counseling Your health care provider may ask you questions about your:  Alcohol use.  Tobacco use.  Drug use.  Emotional well-being.  Home and relationship  well-being.  Sexual activity.  Eating habits.  History of falls.  Memory and ability to understand (cognition).  Work and work environment.  Screening You may have the following tests or measurements:  Height, weight, and BMI.  Blood pressure.  Lipid and cholesterol levels. These may be checked every 5 years, or more frequently if you are over 50 years old.  Skin check.  Lung cancer screening. You may have this screening every year starting at age 55 if you have a 30-pack-year history of smoking and currently smoke or have quit within the past 15 years.  Fecal occult blood test (FOBT) of the stool. You may have this test every year starting at age 50.  Flexible sigmoidoscopy or colonoscopy. You may have a sigmoidoscopy every 5 years or a colonoscopy every 10 years starting at age 50.  Prostate cancer screening. Recommendations will vary depending on your family history and other risks.  Hepatitis C blood test.  Hepatitis B blood test.  Sexually transmitted disease (STD) testing.  Diabetes screening. This is done by checking your blood sugar (glucose) after you have not eaten for a while (fasting). You may have this done every 1-3 years.  Abdominal aortic aneurysm (AAA) screening. You may need this if you are a current or former smoker.  Osteoporosis. You may be screened starting at age 70 if you are at high risk.  Talk with your health care provider about your test results, treatment options, and if necessary, the need for more tests. Vaccines Your health care provider may recommend certain vaccines, such as:  Influenza vaccine. This   is recommended every year.  Tetanus, diphtheria, and acellular pertussis (Tdap, Td) vaccine. You may need a Td booster every 10 years.  Varicella vaccine. You may need this if you have not been vaccinated.  Zoster vaccine. You may need this after age 66.  Measles, mumps, and rubella (MMR) vaccine. You may need at least one dose of  MMR if you were born in 1957 or later. You may also need a second dose.  Pneumococcal 13-valent conjugate (PCV13) vaccine. One dose is recommended after age 60.  Pneumococcal polysaccharide (PPSV23) vaccine. One dose is recommended after age 68.  Meningococcal vaccine. You may need this if you have certain conditions.  Hepatitis A vaccine. You may need this if you have certain conditions or if you travel or work in places where you may be exposed to hepatitis A.  Hepatitis B vaccine. You may need this if you have certain conditions or if you travel or work in places where you may be exposed to hepatitis B.  Haemophilus influenzae type b (Hib) vaccine. You may need this if you have certain risk factors.  Talk to your health care provider about which screenings and vaccines you need and how often you need them. This information is not intended to replace advice given to you by your health care provider. Make sure you discuss any questions you have with your health care provider. Document Released: 09/06/2015 Document Revised: 04/29/2016 Document Reviewed: 06/11/2015 Elsevier Interactive Patient Education  2017 [employer].

## 2017-01-22 LAB — CBC WITH DIFFERENTIAL/PLATELET
BASOS: 1 %
Basophils Absolute: 0 10*3/uL (ref 0.0–0.2)
EOS (ABSOLUTE): 0.1 10*3/uL (ref 0.0–0.4)
EOS: 1 %
HEMATOCRIT: 39.4 % (ref 37.5–51.0)
Hemoglobin: 13.7 g/dL (ref 13.0–17.7)
IMMATURE GRANS (ABS): 0 10*3/uL (ref 0.0–0.1)
Immature Granulocytes: 0 %
LYMPHS: 21 %
Lymphocytes Absolute: 1.4 10*3/uL (ref 0.7–3.1)
MCH: 29.3 pg (ref 26.6–33.0)
MCHC: 34.8 g/dL (ref 31.5–35.7)
MCV: 84 fL (ref 79–97)
Monocytes Absolute: 0.3 10*3/uL (ref 0.1–0.9)
Monocytes: 4 %
NEUTROS ABS: 4.8 10*3/uL (ref 1.4–7.0)
Neutrophils: 73 %
PLATELETS: 191 10*3/uL (ref 150–379)
RBC: 4.68 x10E6/uL (ref 4.14–5.80)
RDW: 14.5 % (ref 12.3–15.4)
WBC: 6.6 10*3/uL (ref 3.4–10.8)

## 2017-01-22 LAB — LIPID PANEL
CHOL/HDL RATIO: 3.5 ratio (ref 0.0–5.0)
Cholesterol, Total: 149 mg/dL (ref 100–199)
HDL: 43 mg/dL (ref >39)
LDL CALC: 88 mg/dL (ref 0–99)
Triglycerides: 90 mg/dL (ref 0–149)
VLDL CHOLESTEROL CAL: 18 mg/dL (ref 5–40)

## 2017-01-22 LAB — COMPREHENSIVE METABOLIC PANEL
A/G RATIO: 1.3 (ref 1.2–2.2)
ALBUMIN: 4.2 g/dL (ref 3.6–4.8)
ALT: 22 IU/L (ref 0–44)
AST: 15 IU/L (ref 0–40)
Alkaline Phosphatase: 82 IU/L (ref 39–117)
BILIRUBIN TOTAL: 0.6 mg/dL (ref 0.0–1.2)
BUN / CREAT RATIO: 16 (ref 10–24)
BUN: 14 mg/dL (ref 8–27)
CALCIUM: 9.3 mg/dL (ref 8.6–10.2)
CHLORIDE: 106 mmol/L (ref 96–106)
CO2: 22 mmol/L (ref 18–29)
Creatinine, Ser: 0.89 mg/dL (ref 0.76–1.27)
GFR, EST AFRICAN AMERICAN: 103 mL/min/{1.73_m2} (ref >59)
GFR, EST NON AFRICAN AMERICAN: 89 mL/min/{1.73_m2} (ref >59)
GLOBULIN, TOTAL: 3.2 g/dL (ref 1.5–4.5)
Glucose: 139 mg/dL — ABNORMAL HIGH (ref 65–99)
POTASSIUM: 3.7 mmol/L (ref 3.5–5.2)
SODIUM: 142 mmol/L (ref 134–144)
TOTAL PROTEIN: 7.4 g/dL (ref 6.0–8.5)

## 2017-01-22 LAB — TSH: TSH: 0.917 u[IU]/mL (ref 0.450–4.500)

## 2017-01-22 LAB — HEMOGLOBIN A1C
Est. average glucose Bld gHb Est-mCnc: 131 mg/dL
Hgb A1c MFr Bld: 6.2 % — ABNORMAL HIGH (ref 4.8–5.6)

## 2017-02-04 ENCOUNTER — Telehealth: Payer: Self-pay | Admitting: Family Medicine

## 2017-02-04 NOTE — Telephone Encounter (Signed)
Pt called about referral and also wanted to follow up on lab work he had done on 01/21/17. He would like to know what to work on. Please advise. Pt callback number is [phone].

## 2017-02-05 NOTE — Telephone Encounter (Signed)
Noted and agree. 

## 2017-02-05 NOTE — Telephone Encounter (Signed)
Pt received [facility] Chavez referral appointment scheduled 7/10 Normal lab results given and advised A1c, glucose abnormal. Information provided to start exercise regimen 30 minutes for 3 days/per week and to monitor diet low carbohydrates; breads, pasta, sweetened drinks  States, he will work on exercise and diet control  Messages routed to Dr. Tamala Julian if new information is needed

## 2017-02-07 ENCOUNTER — Other Ambulatory Visit: Payer: Self-pay | Admitting: Family Medicine

## 2017-02-12 ENCOUNTER — Other Ambulatory Visit: Payer: Self-pay | Admitting: Family Medicine

## 2017-02-21 ENCOUNTER — Encounter: Payer: Self-pay | Admitting: Family Medicine

## 2017-02-22 NOTE — Progress Notes (Signed)
Letter sent.

## 2017-03-06 ENCOUNTER — Other Ambulatory Visit: Payer: Self-pay | Admitting: Family Medicine

## 2017-04-14 ENCOUNTER — Encounter: Payer: Self-pay | Admitting: Physician Assistant

## 2017-04-14 ENCOUNTER — Ambulatory Visit (INDEPENDENT_AMBULATORY_CARE_PROVIDER_SITE_OTHER): Payer: 59 | Admitting: Physician Assistant

## 2017-04-14 VITALS — BP 142/72 | HR 61 | Temp 97.6°F | Resp 16 | Ht 70.08 in | Wt 207.0 lb

## 2017-04-14 DIAGNOSIS — R300 Dysuria: Secondary | ICD-10-CM | POA: Diagnosis not present

## 2017-04-14 DIAGNOSIS — N3000 Acute cystitis without hematuria: Secondary | ICD-10-CM | POA: Diagnosis not present

## 2017-04-14 LAB — POCT URINALYSIS DIP (MANUAL ENTRY)
BILIRUBIN UA: NEGATIVE mg/dL
Bilirubin, UA: NEGATIVE
Glucose, UA: NEGATIVE mg/dL
Nitrite, UA: NEGATIVE
PH UA: 6.5 (ref 5.0–8.0)
SPEC GRAV UA: 1.02 (ref 1.010–1.025)
UROBILINOGEN UA: 1 U/dL

## 2017-04-14 MED ORDER — CIPROFLOXACIN HCL 500 MG PO TABS: 500.0000 mg | ORAL_TABLET | Freq: Two times a day (BID) | ORAL | 0 refills | Status: DC

## 2017-04-14 NOTE — Patient Instructions (Addendum)
Urinary Tract Infection, Adult A urinary tract infection (UTI) is an infection of any part of the urinary tract. The urinary tract includes the:  Kidneys.  Ureters.  Bladder.  Urethra.  These organs make, store, and get rid of pee (urine) in the body. Follow these instructions at home:  Take over-the-counter and prescription medicines only as told by your doctor.  If you were prescribed an antibiotic medicine, take it as told by your doctor. Do not stop taking the antibiotic even if you start to feel better.  Avoid the following drinks: ? Alcohol. ? Caffeine. ? Tea. ? Carbonated drinks.  Drink enough fluid to keep your pee clear or pale yellow.  Keep all follow-up visits as told by your doctor. This is important.  Make sure to: ? Empty your bladder often and completely. Do not to hold pee for long periods of time. ? Empty your bladder before and after sex. ? Wipe from front to back after a bowel movement if you are male. Use each tissue one time when you wipe. Contact a doctor if:  You have back pain.  You have a fever.  You feel sick to your stomach (nauseous).  You throw up (vomit).  Your symptoms do not get better after 3 days.  Your symptoms go away and then come back. Get help right away if:  You have very bad back pain.  You have very bad lower belly (abdominal) pain.  You are throwing up and cannot keep down any medicines or water. This information is not intended to replace advice given to you by your health care provider. Make sure you discuss any questions you have with your health care provider. Document Released: 01/27/2008 Document Revised: 01/16/2016 Document Reviewed: 07/01/2015 Elsevier Interactive Patient Education  2018 [employer]     IF you received an x-ray today, you will receive an invoice from [facility]. Please contact [facility] at [phone] with questions or concerns regarding your invoice.   IF you  received labwork today, you will receive an invoice from [facility]. Please contact [facility] at [phone] with questions or concerns regarding your invoice.   Our billing staff will not be able to assist you with questions regarding bills from these companies.  You will be contacted with the lab results as soon as they are available. The fastest way to get your results is to activate your My Chart account. Instructions are located on the last page of this paperwork. If you have not heard from us regarding the results in 2 weeks, please contact this office.      

## 2017-04-14 NOTE — Progress Notes (Signed)
PRIMARY CARE AT [place], [place] [id number] [phone]  Date:  04/14/2017   Name:  Juan Chavez   DOB:  1951/06/10   MRN:  [id number]  PCP:  [name], MD    History of Present Illness:  Juan Chavez is a 66 y.o. male patient who presents to PCP with  Chief Complaint  Patient presents with  . Dysuria    x 3 days      Mild dysuria.  He has suprapubic pain.  No nausea.  No flank pain.  He has some slight frequency.   No hematuria. No penile discharge No fever  Patient Active Problem List   Diagnosis Date Noted  . Family history of cardiovascular disease 01/21/2017  . Caregiver stress 11/17/2016  . ASNHL (asymmetrical sensorineural hearing loss) 12/17/2015  . Obesity (BMI 30.0-34.9) 07/03/2014  . H/O malignant neoplasm of prostate 12/30/2012  . 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 Other abnormal glucose 11/14/2012  . Unspecified vitamin D deficiency 11/14/2012  . Allergic rhinitis 11/14/2012  . Essential hypertension, benign 08/02/2012  . Genuine stress incontinence, male 07/26/2012    Past Medical History:  Diagnosis Date  . Allergic rhinitis, cause unspecified   . Allergy   . Cancer [facility])    Prostate cancer  . Chicken pox    childhood  . Essential hypertension, benign   . Hematuria, unspecified   . Impotence of organic origin   . Measles    childhood  . Mumps    childhood  . Nonspecific abnormal electrocardiogram (ECG) (EKG)   . Obstructive sleep apnea (adult) (pediatric)    CPAP  . Other abnormal glucose   . Other B-complex deficiencies   . Personal history of malignant neoplasm of prostate   . Personal history of urinary calculi   . Pure hypercholesterolemia   . Unspecified urinary incontinence   . Unspecified vitamin D deficiency     Past Surgical History:  Procedure Laterality Date  . COLONOSCOPY  01/22/2013   normal.  [facility].  Repeat 5 years.  . ELBOW SURGERY    . PROSTATECTOMY  2005  . urological surgery  07/2009     Social History  Substance Use Topics  . Smoking status: Never Smoker  . Smokeless tobacco: Never Used  . Alcohol use 0.0 oz/week     Comment: occasional drinks beer 1 per week    Family History  Problem Relation Age of Onset  . Heart disease Father 60       AMI age 11; CM.  Marland Kitchen Hypertension Father   . Mental illness Sister        home in [place]  . Alcohol abuse Sister   . Hypertension Mother   . Heart disease Mother        AMI years ago; no CABG/stents.  . Arthritis Mother   . Dementia Mother   . Heart disease Brother 58       AMI; CM    No Known Allergies  Medication list has been reviewed and updated.  Current Outpatient Prescriptions on File Prior to Visit  Medication Sig Dispense Refill  . ALPRAZolam (XANAX) 0.5 MG tablet Take 1 tablet (0.5 mg total) by mouth daily as needed for anxiety. 30 tablet 0  . aspirin 81 MG tablet Take 81 mg by mouth daily.    . cholecalciferol (VITAMIN D) 1000 UNITS tablet Take 1,000 Units by mouth 2 (two) times daily.    . cloNIDine (CATAPRES) 0.1 MG tablet Take  1 tablet (0.1 mg total) by mouth 2 (two) times daily. 180 tablet 3  . Cyanocobalamin (VITAMIN B-12 PO) Take by mouth daily.    Marland Kitchen diltiazem (CARDIZEM CD) 180 MG 24 hr capsule TAKE 1 CAPSULE (180 MG TOTAL) BY MOUTH 2 (TWO) TIMES DAILY. 180 capsule 3  . fluticasone (FLONASE) 50 MCG/ACT nasal spray PLACE 2 SPRAYS INTO BOTH NOSTRILS DAILY. 16 g 11  . loratadine (CLARITIN) 10 MG tablet TAKE 1 TABLET (10 MG TOTAL) BY MOUTH DAILY. 90 tablet 3  . MYRBETRIQ 50 MG TB24 Take 1 tablet by mouth daily. For urinary incontinence    . tadalafil (CIALIS) 20 MG tablet TAKE 1 TABLET (20 MG TOTAL) BY MOUTH DAILY AS NEEDED FOR ERECTILE DYSFUNCTION. 8 tablet 11  . valsartan (DIOVAN) 320 MG tablet Take 1 tablet (320 mg total) by mouth daily. 90 tablet 3   No current facility-administered medications on file prior to visit.     ROS ROS otherwise unremarkable unless listed above.  Physical  Examination: There were no vitals taken for this visit. Ideal Body Weight:    Physical Exam  Constitutional: He is oriented to person, place, and time. He appears well-developed and well-nourished. No distress.  HENT:  Head: Normocephalic and atraumatic.  Eyes: Pupils are equal, round, and reactive to light. Conjunctivae and EOM are normal.  Cardiovascular: Normal rate.   Pulmonary/Chest: Effort normal. No respiratory distress.  Abdominal: Normal appearance and bowel sounds are normal.  Left sided suprapubic pain.  Neurological: He is alert and oriented to person, place, and time.  Skin: Skin is warm and dry. He is not diaphoretic.  Psychiatric: He has a normal mood and affect. His behavior is normal.    Results for orders placed or performed in visit on 04/14/17  POCT urinalysis dipstick  Result Value Ref Range   Color, UA yellow yellow   Clarity, UA cloudy (A) clear   Glucose, UA negative negative mg/dL   Bilirubin, UA negative negative   Ketones, POC UA negative negative mg/dL   Spec Grav, UA 1.020 1.010 - 1.025   Blood, UA moderate (A) negative   pH, UA 6.5 5.0 - 8.0   Protein Ur, POC =30 (A) negative mg/dL   Urobilinogen, UA 1.0 0.2 or 1.0 E.U./dL   Nitrite, UA Negative Negative   Leukocytes, UA Large (3+) (A) Negative    Assessment and Plan: Juan Chavez is a 66 y.o. male who is here today for cc of dysuria.  Acute cystitis without hematuria - Plan: ciprofloxacin (CIPRO) 500 MG tablet  Dysuria - Plan: POCT urinalysis dipstick, Urine Culture  Ivar Drape, PA-C [facility] 8/22/20188:08 PM

## 2017-04-16 LAB — URINE CULTURE

## 2017-04-20 ENCOUNTER — Telehealth: Payer: Self-pay | Admitting: Family Medicine

## 2017-04-20 DIAGNOSIS — N3000 Acute cystitis without hematuria: Secondary | ICD-10-CM

## 2017-04-20 MED ORDER — CIPROFLOXACIN HCL 500 MG PO TABS: 500.0000 mg | ORAL_TABLET | Freq: Two times a day (BID) | ORAL | 0 refills | Status: DC

## 2017-04-20 NOTE — Telephone Encounter (Signed)
Discussed result with patient

## 2017-04-26 NOTE — Progress Notes (Signed)
Subjective:    Patient ID: Juan Chavez, male    DOB: 02-26-1951, 66 y.o.   MRN: [id number]  04/27/2017  Hypertension (3 month follow-up) and Pelvic Pain   HPI This 66 y.o. male presents for three month follow-up of hypertension, glucose intolerance, OSA.   UTI: urine culture + E. Coli.  S/p Cipro therapy.  Continues to have pelvic pain.  No dysuria, +urgency but a lot of water.  Slight blood present in urine.  Drinking cranberry juice.  No worsening urinary incontinence.  No fever but +sweats.  No nausea or vomiting; no flank pain.  Lower back pain and lower pelvic pain on L side.  No testicular swelling or pain.  Did have pain in L groin region with intercourse lately; recurrent pain after intercourse.   Last appointment with Dr. Terance Hart of [facility] urology unsure.    Blood pressure at home running 120/70s.  Has lost 12 pounds since last visit in 12/2016.      BP Readings from Last 3 Encounters:  04/27/17 (!) 142/88  04/14/17 (!) 142/72  01/21/17 (!) 160/92   Wt Readings from Last 3 Encounters:  04/27/17 207 lb (93.9 kg)  04/14/17 207 lb (93.9 kg)  01/21/17 219 lb (99.3 kg)   Immunization History  Administered Date(s) Administered  . Influenza Split 04/24/2012  . Influenza,inj,Quad PF,6+ Mos 05/29/2013, 07/02/2014, 07/07/2016, 04/27/2017  . Influenza-Unspecified 05/09/2015  . Pneumococcal Conjugate-13 09/02/2015  . Pneumococcal Polysaccharide-23 10/27/2016  . Tdap 08/25/2007    Review of Systems  Constitutional: Negative for activity change, appetite change, chills, diaphoresis, fatigue and fever.  Respiratory: Negative for cough and shortness of breath.   Cardiovascular: Negative for chest pain, palpitations and leg swelling.  Gastrointestinal: Negative for abdominal pain, diarrhea, nausea and vomiting.  Endocrine: Negative for cold intolerance, heat intolerance, polydipsia, polyphagia and polyuria.  Genitourinary: Positive for flank pain, hematuria and urgency.  Negative for decreased urine volume, discharge, dysuria, frequency, penile pain, penile swelling, scrotal swelling and testicular pain.  Skin: Negative for color change, rash and wound.  Neurological: Negative for dizziness, tremors, seizures, syncope, facial asymmetry, speech difficulty, weakness, light-headedness, numbness and headaches.  Psychiatric/Behavioral: Negative for dysphoric mood and sleep disturbance. The patient is not nervous/anxious.     Past Medical History:  Diagnosis Date  . Allergic rhinitis, cause unspecified   . Allergy   . Cancer [facility])    Prostate cancer  . Chicken pox    childhood  . Essential hypertension, benign   . Hematuria, unspecified   . Impotence of organic origin   . Measles    childhood  . Mumps    childhood  . Nonspecific abnormal electrocardiogram (ECG) (EKG)   . Obstructive sleep apnea (adult) (pediatric)    CPAP  . Other abnormal glucose   . Other B-complex deficiencies   . Personal history of malignant neoplasm of prostate   . Personal history of urinary calculi   . Pure hypercholesterolemia   . Unspecified urinary incontinence   . Unspecified vitamin D deficiency    Past Surgical History:  Procedure Laterality Date  . COLONOSCOPY  01/22/2013   normal.  [facility].  Repeat 5 years.  . ELBOW SURGERY    . PROSTATECTOMY  2005  . urological surgery  07/2009   No Known Allergies  Social History   Social History  . Marital status: Married    Spouse name: N/A  . Number of children: 2  . Years of education: N/A   Occupational History  .  [employer]    Social History Main Topics  . Smoking status: Never Smoker  . Smokeless tobacco: Never Used  . Alcohol use 0.0 oz/week     Comment: occasional drinks beer 1 per week  . Drug use: No  . Sexual activity: Yes    Partners: Female   Other Topics Concern  . Not on file   Social History Narrative   Marital status:  Married x 17 years happily married.      Children:  2  children; 4 grandchildren.      Lives: with wife.  Children local.      Employment: [employer] x 46 years.  Plans to retire unknown.       Tobacco:  Never.      Alcohol: rare wine.      Drugs: none      Exercise: moderate, walking and running daily for 15 minutes.      Seatbelt:  Always uses seat belts.       Smoke alarm and carbon monoxide detector in the home.      Caffeine use: none.       Guns:  1 gun loaded; others unloaded.         Family History  Problem Relation Age of Onset  . Heart disease Father 21       AMI age 67; CM.  Marland Kitchen Hypertension Father   . Mental illness Sister        home in [place]  . Alcohol abuse Sister   . Hypertension Mother   . Heart disease Mother        AMI years ago; no CABG/stents.  . Arthritis Mother   . Dementia Mother   . Heart disease Brother 16       AMI; CM       Objective:    BP (!) 142/88   Pulse (!) 55   Temp 98 F (36.7 C) (Oral)   Resp 16   Ht 5' 9.69" (1.77 m)   Wt 207 lb (93.9 kg)   SpO2 98%   BMI 29.97 kg/m  Physical Exam  Constitutional: He is oriented to person, place, and time. He appears well-developed and well-nourished. No distress.  HENT:  Head: Normocephalic and atraumatic.  Right Ear: External ear normal.  Left Ear: External ear normal.  Nose: Nose normal.  Mouth/Throat: Oropharynx is clear and moist.  Eyes: Pupils are equal, round, and reactive to light. Conjunctivae and EOM are normal.  Neck: Normal range of motion. Neck supple. Carotid bruit is not present. No thyromegaly present.  Cardiovascular: Normal rate, regular rhythm, normal heart sounds and intact distal pulses.  Exam reveals no gallop and no friction rub.   No murmur heard. Pulmonary/Chest: Effort normal and breath sounds normal. He has no wheezes. He has no rales.  Abdominal: Soft. Bowel sounds are normal. He exhibits no distension and no mass. There is no tenderness. There is no rebound and no guarding. Hernia confirmed negative in the right  inguinal area and confirmed negative in the left inguinal area.  Genitourinary: Testes normal and penis normal. Right testis shows no mass, no swelling and no tenderness. Left testis shows no mass, no swelling and no tenderness.  Lymphadenopathy:    He has no cervical adenopathy.       Right: No inguinal adenopathy present.       Left: No inguinal adenopathy present.  Neurological: He is alert and oriented to person, place, and time. No cranial  nerve deficit.  Skin: Skin is warm and dry. No rash noted. He is not diaphoretic.  Psychiatric: He has a normal mood and affect. His behavior is normal.  Nursing note and vitals reviewed.  Results for orders placed or performed in visit on 04/27/17  POCT urinalysis dipstick  Result Value Ref Range   Color, UA yellow yellow   Clarity, UA clear clear   Glucose, UA negative negative mg/dL   Bilirubin, UA negative negative   Ketones, POC UA negative negative mg/dL   Spec Grav, UA 1.020 1.010 - 1.025   Blood, UA trace-lysed (A) negative   pH, UA 6.5 5.0 - 8.0   Protein Ur, POC negative negative mg/dL   Urobilinogen, UA 0.2 0.2 or 1.0 E.U./dL   Nitrite, UA Negative Negative   Leukocytes, UA Negative Negative   No results found. Depression screen [facility] 2/9 04/27/2017 04/14/2017 01/21/2017 10/27/2016 07/07/2016  Decreased Interest 0 0 0 0 0  Down, Depressed, Hopeless 0 0 0 0 0  PHQ - 2 Score 0 0 0 0 0   Fall Risk  04/27/2017 04/14/2017 01/21/2017 10/27/2016 07/07/2016  Falls in the past year? No No No No No        Assessment & Plan:   1. Essential hypertension, benign   2. Seasonal allergic rhinitis due to pollen   3. Caregiver stress   4. Other abnormal glucose   5. Acute cystitis with hematuria   6. H/O malignant neoplasm of prostate   7. Acute cystitis without hematuria   8. Need for prophylactic vaccination and inoculation against influenza    -persistent pelvic pain with recent UTI; send urine cx; warrants longer duration of therapy; rx for  Cipro 500mg  bid x 14 days.  Obtain PSA; history of prostate cancer with prostatectomy; referral to [facility] as last appointment in 2014.  -hypertension improved with Valsartan 320mg  daily; pt discussed Valsartan generic with pharmacist; pt does not have Valsartan that was recalled; continue with current rx; recommend discussing again with pharmacist to confirm there is no need to switch to Olmesartan. -congratulations on weight loss and dietary modification for glucose intolerance. Obtain labs. -s/p flu vaccine.   Orders Placed This Encounter  Procedures  . Urine Culture  . Flu Vaccine QUAD 36+ mos IM  . Urine Microscopic  . CBC with Differential/Platelet  . Comprehensive metabolic panel  . Hemoglobin A1c  . PSA  . Ambulatory referral to Urology    Referral Priority:   Routine    Referral Type:   Consultation    Referral Reason:   Specialty Services Required    Requested Specialty:   Urology    Number of Visits Requested:   1  . POCT urinalysis dipstick   Meds ordered this encounter  Medications  . ciprofloxacin (CIPRO) 500 MG tablet    Sig: Take 1 tablet (500 mg total) by mouth 2 (two) times daily.    Dispense:  30 tablet    Refill:  0    Return in about 3 weeks (around 05/18/2017) for recheck urinary tract infection.   Quatavious Rossa Elayne Guerin, M.D. Primary Care at [facility] [place] [facility] [address] [place], [place]  [address] [phone] phone [phone] fax

## 2017-04-27 ENCOUNTER — Encounter: Payer: Self-pay | Admitting: Family Medicine

## 2017-04-27 ENCOUNTER — Ambulatory Visit (INDEPENDENT_AMBULATORY_CARE_PROVIDER_SITE_OTHER): Payer: 59 | Admitting: Family Medicine

## 2017-04-27 VITALS — BP 142/88 | HR 55 | Temp 98.0°F | Resp 16 | Ht 69.69 in | Wt 207.0 lb

## 2017-04-27 DIAGNOSIS — R7309 Other abnormal glucose: Secondary | ICD-10-CM | POA: Diagnosis not present

## 2017-04-27 DIAGNOSIS — Z23 Encounter for immunization: Secondary | ICD-10-CM

## 2017-04-27 DIAGNOSIS — J301 Allergic rhinitis due to pollen: Secondary | ICD-10-CM | POA: Diagnosis not present

## 2017-04-27 DIAGNOSIS — Z8546 Personal history of malignant neoplasm of prostate: Secondary | ICD-10-CM

## 2017-04-27 DIAGNOSIS — Z636 Dependent relative needing care at home: Secondary | ICD-10-CM

## 2017-04-27 DIAGNOSIS — N3000 Acute cystitis without hematuria: Secondary | ICD-10-CM

## 2017-04-27 DIAGNOSIS — I10 Essential (primary) hypertension: Secondary | ICD-10-CM | POA: Diagnosis not present

## 2017-04-27 DIAGNOSIS — N3001 Acute cystitis with hematuria: Secondary | ICD-10-CM | POA: Diagnosis not present

## 2017-04-27 LAB — POCT URINALYSIS DIP (MANUAL ENTRY)
BILIRUBIN UA: NEGATIVE
BILIRUBIN UA: NEGATIVE mg/dL
GLUCOSE UA: NEGATIVE mg/dL
Leukocytes, UA: NEGATIVE
Nitrite, UA: NEGATIVE
PH UA: 6.5 (ref 5.0–8.0)
Protein Ur, POC: NEGATIVE mg/dL
Spec Grav, UA: 1.02 (ref 1.010–1.025)
Urobilinogen, UA: 0.2 E.U./dL

## 2017-04-27 MED ORDER — CIPROFLOXACIN HCL 500 MG PO TABS: 500.0000 mg | ORAL_TABLET | Freq: Two times a day (BID) | ORAL | 0 refills | Status: DC

## 2017-04-27 NOTE — Patient Instructions (Signed)
     IF you received an x-ray today, you will receive an invoice from [facility]. Please contact [facility] at [phone] with questions or concerns regarding your invoice.   IF you received labwork today, you will receive an invoice from [facility]. Please contact [facility] at [phone] with questions or concerns regarding your invoice.   Our billing staff will not be able to assist you with questions regarding bills from these companies.  You will be contacted with the lab results as soon as they are available. The fastest way to get your results is to activate your My Chart account. Instructions are located on the last page of this paperwork. If you have not heard from us regarding the results in 2 weeks, please contact this office.     

## 2017-04-28 LAB — URINE CULTURE

## 2017-04-28 LAB — CBC WITH DIFFERENTIAL/PLATELET
BASOS: 0 %
Basophils Absolute: 0 10*3/uL (ref 0.0–0.2)
EOS (ABSOLUTE): 0.1 10*3/uL (ref 0.0–0.4)
Eos: 1 %
Hematocrit: 42.6 % (ref 37.5–51.0)
Hemoglobin: 14.3 g/dL (ref 13.0–17.7)
IMMATURE GRANS (ABS): 0 10*3/uL (ref 0.0–0.1)
IMMATURE GRANULOCYTES: 0 %
LYMPHS: 17 %
Lymphocytes Absolute: 1.4 10*3/uL (ref 0.7–3.1)
MCH: 29.7 pg (ref 26.6–33.0)
MCHC: 33.6 g/dL (ref 31.5–35.7)
MCV: 88 fL (ref 79–97)
MONOS ABS: 0.4 10*3/uL (ref 0.1–0.9)
Monocytes: 5 %
NEUTROS ABS: 6.1 10*3/uL (ref 1.4–7.0)
Neutrophils: 77 %
PLATELETS: 189 10*3/uL (ref 150–379)
RBC: 4.82 x10E6/uL (ref 4.14–5.80)
RDW: 13.6 % (ref 12.3–15.4)
WBC: 8 10*3/uL (ref 3.4–10.8)

## 2017-04-28 LAB — COMPREHENSIVE METABOLIC PANEL
A/G RATIO: 1.6 (ref 1.2–2.2)
ALK PHOS: 84 IU/L (ref 39–117)
ALT: 18 IU/L (ref 0–44)
AST: 16 IU/L (ref 0–40)
Albumin: 4.5 g/dL (ref 3.6–4.8)
BILIRUBIN TOTAL: 0.5 mg/dL (ref 0.0–1.2)
BUN/Creatinine Ratio: 22 (ref 10–24)
BUN: 18 mg/dL (ref 8–27)
CHLORIDE: 103 mmol/L (ref 96–106)
CO2: 25 mmol/L (ref 20–29)
Calcium: 9.2 mg/dL (ref 8.6–10.2)
Creatinine, Ser: 0.83 mg/dL (ref 0.76–1.27)
GFR calc non Af Amer: 92 mL/min/{1.73_m2} (ref >59)
GFR, EST AFRICAN AMERICAN: 106 mL/min/{1.73_m2} (ref >59)
Globulin, Total: 2.8 g/dL (ref 1.5–4.5)
Glucose: 123 mg/dL — ABNORMAL HIGH (ref 65–99)
POTASSIUM: 3.9 mmol/L (ref 3.5–5.2)
Sodium: 141 mmol/L (ref 134–144)
TOTAL PROTEIN: 7.3 g/dL (ref 6.0–8.5)

## 2017-04-28 LAB — PSA

## 2017-04-28 LAB — URINALYSIS, MICROSCOPIC ONLY
Bacteria, UA: NONE SEEN
CASTS: NONE SEEN /LPF

## 2017-04-28 LAB — HEMOGLOBIN A1C
Est. average glucose Bld gHb Est-mCnc: 123 mg/dL
HEMOGLOBIN A1C: 5.9 % — AB (ref 4.8–5.6)

## 2017-05-18 ENCOUNTER — Encounter: Payer: Self-pay | Admitting: Family Medicine

## 2017-05-18 ENCOUNTER — Ambulatory Visit (INDEPENDENT_AMBULATORY_CARE_PROVIDER_SITE_OTHER): Payer: 59 | Admitting: Family Medicine

## 2017-05-18 VITALS — BP 138/82 | HR 72 | Temp 97.8°F | Resp 16 | Ht 70.08 in | Wt 203.0 lb

## 2017-05-18 DIAGNOSIS — I10 Essential (primary) hypertension: Secondary | ICD-10-CM

## 2017-05-18 DIAGNOSIS — Z8546 Personal history of malignant neoplasm of prostate: Secondary | ICD-10-CM | POA: Diagnosis not present

## 2017-05-18 DIAGNOSIS — R102 Pelvic and perineal pain: Secondary | ICD-10-CM | POA: Diagnosis not present

## 2017-05-18 LAB — POCT URINALYSIS DIP (MANUAL ENTRY)
BILIRUBIN UA: NEGATIVE
GLUCOSE UA: NEGATIVE mg/dL
Ketones, POC UA: NEGATIVE mg/dL
Leukocytes, UA: NEGATIVE
Nitrite, UA: NEGATIVE
SPEC GRAV UA: 1.015 (ref 1.010–1.025)
UROBILINOGEN UA: 0.2 U/dL
pH, UA: 7 (ref 5.0–8.0)

## 2017-05-18 MED ORDER — MIRABEGRON ER 50 MG PO TB24: 50.0000 mg | ORAL_TABLET | Freq: Every day | ORAL | 1 refills | Status: DC

## 2017-05-18 NOTE — Patient Instructions (Signed)
     IF you received an x-ray today, you will receive an invoice from [facility]. Please contact [facility] at [phone] with questions or concerns regarding your invoice.   IF you received labwork today, you will receive an invoice from [facility]. Please contact [facility] at [phone] with questions or concerns regarding your invoice.   Our billing staff will not be able to assist you with questions regarding bills from these companies.  You will be contacted with the lab results as soon as they are available. The fastest way to get your results is to activate your My Chart account. Instructions are located on the last page of this paperwork. If you have not heard from us regarding the results in 2 weeks, please contact this office.     

## 2017-05-18 NOTE — Progress Notes (Signed)
Subjective:    Patient ID: Juan Chavez, male    DOB: 08-Aug-1951, 66 y.o.   MRN: [id number]  05/18/2017  Urinary Frequency (with pelvic pain follow-up )   HPI This 66 y.o. male presents for evaluation of ongoing pelvic pain.  S/p evaluation two weeks ago; treated with Cipro for additional two weeks.  Urine cx negative at last visit.  Urine normal.  Advised to contact urologist for follow-up appointment.  Colonoscopy 01/20/2013 at [facility].  Intermittent pain in RIGHT pelvic area; moves; will then radiate to the LEFT side.  Movement helps the pain.  No fever/chills/sweats.   No nausea or vomiting; no diarrhea.  No increase flatus.  Appetite is normal.  No hematuria.  Nocturia x 1-2; baseline is 1-2.  Has been constipated.  Constipation has improved since stopping Cipro.  Last b.m. Last night; no blood in stool. Appointment with urology in 06/15/17.  No noticeable hernia.  No stabbing pain.   Chronic urinary incontinence; might be a little worse.  Not taking Myrbetriq.  Rare caffeine.    BP Readings from Last 3 Encounters:  05/18/17 (!) 152/82  04/27/17 (!) 142/88  04/14/17 (!) 142/72   Wt Readings from Last 3 Encounters:  05/18/17 203 lb (92.1 kg)  04/27/17 207 lb (93.9 kg)  04/14/17 207 lb (93.9 kg)   Immunization History  Administered Date(s) Administered  . Influenza Split 04/24/2012  . Influenza,inj,Quad PF,6+ Mos 05/29/2013, 07/02/2014, 07/07/2016, 04/27/2017  . Influenza-Unspecified 05/09/2015  . Pneumococcal Conjugate-13 09/02/2015  . Pneumococcal Polysaccharide-23 10/27/2016  . Tdap 08/25/2007    Review of Systems  Constitutional: Negative for activity change, appetite change, chills, diaphoresis, fatigue and fever.  Respiratory: Negative for cough and shortness of breath.   Cardiovascular: Negative for chest pain, palpitations and leg swelling.  Gastrointestinal: Positive for constipation. Negative for abdominal distention, abdominal pain, anal bleeding, blood in stool,  diarrhea, nausea, rectal pain and vomiting.  Endocrine: Negative for cold intolerance, heat intolerance, polydipsia, polyphagia and polyuria.  Genitourinary: Positive for flank pain. Negative for decreased urine volume, difficulty urinating, discharge, dysuria, enuresis, frequency, genital sores, hematuria, penile pain, penile swelling, scrotal swelling, testicular pain and urgency.  Skin: Negative for color change, rash and wound.  Neurological: Negative for dizziness, tremors, seizures, syncope, facial asymmetry, speech difficulty, weakness, light-headedness, numbness and headaches.  Psychiatric/Behavioral: Negative for dysphoric mood and sleep disturbance. The patient is not nervous/anxious.     Past Medical History:  Diagnosis Date  . Allergic rhinitis, cause unspecified   . Allergy   . Cancer [facility])    Prostate cancer  . Chicken pox    childhood  . Essential hypertension, benign   . Hematuria, unspecified   . Impotence of organic origin   . Measles    childhood  . Mumps    childhood  . Nonspecific abnormal electrocardiogram (ECG) (EKG)   . Obstructive sleep apnea (adult) (pediatric)    CPAP  . Other abnormal glucose   . Other B-complex deficiencies   . Personal history of malignant neoplasm of prostate   . Personal history of urinary calculi   . Pure hypercholesterolemia   . Unspecified urinary incontinence   . Unspecified vitamin D deficiency    Past Surgical History:  Procedure Laterality Date  . COLONOSCOPY  01/22/2013   normal.  [facility].  Repeat 5 years.  . ELBOW SURGERY    . PROSTATECTOMY  2005  . urological surgery  07/2009   No Known Allergies  Social History   Social  History  . Marital status: Married    Spouse name: N/A  . Number of children: 2  . Years of education: N/A   Occupational History  . [employer]    Social History Main Topics  . Smoking status: Never Smoker  . Smokeless tobacco: Never Used  . Alcohol use 0.0 oz/week      Comment: occasional drinks beer 1 per week  . Drug use: No  . Sexual activity: Yes    Partners: Female   Other Topics Concern  . Not on file   Social History Narrative   Marital status:  Married x 4 years happily married.      Children:  2 children; 4 grandchildren.      Lives: with wife.  Children local.      Employment: [employer] x 46 years.  Plans to retire unknown.       Tobacco:  Never.      Alcohol: rare wine.      Drugs: none      Exercise: moderate, walking and running daily for 15 minutes.      Seatbelt:  Always uses seat belts.       Smoke alarm and carbon monoxide detector in the home.      Caffeine use: none.       Guns:  1 gun loaded; others unloaded.         Family History  Problem Relation Age of Onset  . Heart disease Father 102       AMI age 68; CM.  Marland Kitchen Hypertension Father   . Mental illness Sister        home in [place]  . Alcohol abuse Sister   . Hypertension Mother   . Heart disease Mother        AMI years ago; no CABG/stents.  . Arthritis Mother   . Dementia Mother   . Heart disease Brother 69       AMI; CM       Objective:    BP (!) 152/82   Pulse 72   Temp 97.8 F (36.6 C) (Oral)   Resp 16   Ht 5' 10.08" (1.78 m)   Wt 203 lb (92.1 kg)   SpO2 98%   BMI 29.06 kg/m  Physical Exam  Constitutional: He is oriented to person, place, and time. He appears well-developed and well-nourished. No distress.  HENT:  Head: Normocephalic and atraumatic.  Right Ear: External ear normal.  Left Ear: External ear normal.  Nose: Nose normal.  Mouth/Throat: Oropharynx is clear and moist.  Eyes: Pupils are equal, round, and reactive to light. Conjunctivae and EOM are normal.  Neck: Normal range of motion. Neck supple. Carotid bruit is not present. No thyromegaly present.  Cardiovascular: Normal rate, regular rhythm, normal heart sounds and intact distal pulses.  Exam reveals no gallop and no friction rub.   No murmur heard. Pulmonary/Chest: Effort normal  and breath sounds normal. He has no wheezes. He has no rales.  Abdominal: Soft. Bowel sounds are normal. He exhibits no distension and no mass. There is no tenderness. There is no rebound and no guarding. Hernia confirmed negative in the right inguinal area and confirmed negative in the left inguinal area.  Genitourinary: Testes normal and penis normal.  Lymphadenopathy:    He has no cervical adenopathy.       Right: No inguinal adenopathy present.       Left: No inguinal adenopathy present.  Neurological: He is alert  and oriented to person, place, and time. No cranial nerve deficit.  Skin: Skin is warm and dry. No rash noted. He is not diaphoretic.  Psychiatric: He has a normal mood and affect. His behavior is normal.  Nursing note and vitals reviewed.  Results for orders placed or performed in visit on 05/18/17  POCT urinalysis dipstick  Result Value Ref Range   Color, UA yellow yellow   Clarity, UA clear clear   Glucose, UA negative negative mg/dL   Bilirubin, UA negative negative   Ketones, POC UA negative negative mg/dL   Spec Grav, UA 1.015 1.010 - 1.025   Blood, UA trace-intact (A) negative   pH, UA 7.0 5.0 - 8.0   Protein Ur, POC trace (A) negative mg/dL   Urobilinogen, UA 0.2 0.2 or 1.0 E.U./dL   Nitrite, UA Negative Negative   Leukocytes, UA Negative Negative   No results found. Depression screen [facility] 2/9 05/18/2017 04/27/2017 04/14/2017 01/21/2017 10/27/2016  Decreased Interest 0 0 0 0 0  Down, Depressed, Hopeless 0 0 0 0 0  PHQ - 2 Score 0 0 0 0 0   Fall Risk  05/18/2017 04/27/2017 04/14/2017 01/21/2017 10/27/2016  Falls in the past year? No No No No No        Assessment & Plan:   1. Pelvic pain in male   2. History of prostate cancer   3. Pelvic and perineal pain   4. Essential hypertension, benign    -persistent pain despite treatment of E. Coli UTI.  S/p three weeks of Cipro therapy.  -ddx includes bladder spasm due to recent UTI; also suffering with constipation;  recommend increasing water intake and fiber.  -restart Mybertriq; refer for CT abdomen and pelvis. -recent PSA undetectable; has appointment with urology in one month. -discussed Valsartan with pharmacist; did not need to switch Valsartan.   Orders Placed This Encounter  Procedures  . Urine Culture  . CT ABDOMEN PELVIS W WO CONTRAST    This exam should ONLY be ordered for initial diagnosis or follow up of known pancreatic/liver/renal/bladder masses.    Standing Status:   Future    Standing Expiration Date:   08/17/2018    Order Specific Question:   If indicated for the ordered procedure, I authorize the administration of contrast media per Radiology protocol    Answer:   Yes    Order Specific Question:   Preferred imaging location?    Answer:   [facility]    Order Specific Question:   Radiology Contrast Protocol - do NOT remove file path    Answer:   [email or web address]    Order Specific Question:   Reason for Exam additional comments    Answer:   R pelvis pain; history of prostate cancer  . Comprehensive metabolic panel  . POCT urinalysis dipstick   Meds ordered this encounter  Medications  . mirabegron ER (MYRBETRIQ) 50 MG TB24 tablet    Sig: Take 1 tablet (50 mg total) by mouth daily. For urinary incontinence    Dispense:  30 tablet    Refill:  1    No Follow-up on file.   Fredi Geiler Elayne Guerin, M.D. Primary Care at [facility] [address] [place], [place]  [address] [phone] phone [phone] fax

## 2017-05-19 ENCOUNTER — Ambulatory Visit: Payer: 59 | Admitting: Family Medicine

## 2017-05-19 ENCOUNTER — Telehealth: Payer: Self-pay

## 2017-05-19 LAB — COMPREHENSIVE METABOLIC PANEL
ALBUMIN: 4.3 g/dL (ref 3.6–4.8)
ALT: 23 IU/L (ref 0–44)
AST: 19 IU/L (ref 0–40)
Albumin/Globulin Ratio: 1.6 (ref 1.2–2.2)
Alkaline Phosphatase: 79 IU/L (ref 39–117)
BILIRUBIN TOTAL: 0.6 mg/dL (ref 0.0–1.2)
BUN / CREAT RATIO: 15 (ref 10–24)
BUN: 12 mg/dL (ref 8–27)
CHLORIDE: 103 mmol/L (ref 96–106)
CO2: 26 mmol/L (ref 20–29)
Calcium: 9.3 mg/dL (ref 8.6–10.2)
Creatinine, Ser: 0.82 mg/dL (ref 0.76–1.27)
GFR calc non Af Amer: 92 mL/min/{1.73_m2} (ref >59)
GFR, EST AFRICAN AMERICAN: 106 mL/min/{1.73_m2} (ref >59)
GLOBULIN, TOTAL: 2.7 g/dL (ref 1.5–4.5)
GLUCOSE: 114 mg/dL — AB (ref 65–99)
Potassium: 4 mmol/L (ref 3.5–5.2)
SODIUM: 142 mmol/L (ref 134–144)
Total Protein: 7 g/dL (ref 6.0–8.5)

## 2017-05-19 LAB — URINE CULTURE

## 2017-05-19 NOTE — Telephone Encounter (Signed)
When trying to get approval on cover my meds, myrbetriq was denied.  Following are formulary drugs and will be approved: Oxybutynin chloride Oxybutynin Chloride ER Tolterodine Tartrate Tolterodine Tartrate ER Trospium chloride  Trospium Chloride ER. Please advise.

## 2017-05-21 MED ORDER — OXYBUTYNIN CHLORIDE ER 5 MG PO TB24: 5.0000 mg | ORAL_TABLET | Freq: Every day | ORAL | 1 refills | Status: DC

## 2017-05-21 NOTE — Telephone Encounter (Signed)
I sent in Oxybutynin ER 5mg  daily; please call pt and advise that insurance would not approve Myrbetriq but will apporve Oxybutynin.

## 2017-05-21 NOTE — Telephone Encounter (Signed)
Patient notified of medication change due to insurance.  He also requested to speak to referral department, and I transferred him there.

## 2017-05-25 ENCOUNTER — Telehealth: Payer: Self-pay

## 2017-05-25 ENCOUNTER — Telehealth: Payer: Self-pay | Admitting: Family Medicine

## 2017-05-25 ENCOUNTER — Other Ambulatory Visit: Payer: Self-pay

## 2017-05-25 ENCOUNTER — Encounter: Payer: Self-pay | Admitting: Family Medicine

## 2017-05-25 DIAGNOSIS — Z8546 Personal history of malignant neoplasm of prostate: Secondary | ICD-10-CM

## 2017-05-25 DIAGNOSIS — R102 Pelvic and perineal pain: Secondary | ICD-10-CM

## 2017-05-25 DIAGNOSIS — R1031 Right lower quadrant pain: Principal | ICD-10-CM

## 2017-05-25 DIAGNOSIS — G8929 Other chronic pain: Secondary | ICD-10-CM

## 2017-05-25 NOTE — Telephone Encounter (Signed)
Juan Chavez from [facility] regional ct department called stating that they need a new ct order for pt it needs to be ct abdomen and pelvis with contrast because they don't do w/o contrast for that particular diagnoses.   Please advise

## 2017-05-25 NOTE — Telephone Encounter (Signed)
[facility] does not allow CT abdomen and pelvis with contrast only.  Rad tech advised to enter CT abdomen pelvis with; order found.

## 2017-05-25 NOTE — Telephone Encounter (Signed)
Error

## 2017-05-25 NOTE — Telephone Encounter (Signed)
Dr. Tamala Julian ,   Unable to change this CT Wille Glaser and I tried it wouldn't let us add diagnosis from previous.    Can you reorder?

## 2017-05-31 ENCOUNTER — Ambulatory Visit: Admission: RE | Admit: 2017-05-31 | Payer: 59 | Source: Ambulatory Visit

## 2017-06-07 ENCOUNTER — Encounter: Payer: Self-pay | Admitting: Family Medicine

## 2017-06-07 ENCOUNTER — Ambulatory Visit
Admission: RE | Admit: 2017-06-07 | Discharge: 2017-06-07 | Disposition: A | Discharge status: Home / Self Care | Payer: 59 | Source: Ambulatory Visit | Attending: Family Medicine | Admitting: Family Medicine

## 2017-06-07 DIAGNOSIS — Z8546 Personal history of malignant neoplasm of prostate: Secondary | ICD-10-CM | POA: Diagnosis not present

## 2017-06-07 DIAGNOSIS — R911 Solitary pulmonary nodule: Secondary | ICD-10-CM | POA: Diagnosis not present

## 2017-06-07 DIAGNOSIS — N289 Disorder of kidney and ureter, unspecified: Secondary | ICD-10-CM | POA: Diagnosis not present

## 2017-06-07 DIAGNOSIS — D739 Disease of spleen, unspecified: Secondary | ICD-10-CM | POA: Insufficient documentation

## 2017-06-07 DIAGNOSIS — R102 Pelvic and perineal pain: Secondary | ICD-10-CM

## 2017-06-07 DIAGNOSIS — K5730 Diverticulosis of large intestine without perforation or abscess without bleeding: Secondary | ICD-10-CM | POA: Insufficient documentation

## 2017-06-07 HISTORY — DX: Malignant neoplasm of prostate: C61

## 2017-06-07 MED ORDER — IOPAMIDOL (ISOVUE-300) INJECTION 61%
100.0000 mL | Freq: Once | INTRAVENOUS | Status: AC | PRN
  Administered 2017-06-07: 100 mL via INTRAVENOUS

## 2017-06-15 ENCOUNTER — Other Ambulatory Visit: Payer: Self-pay | Admitting: Family Medicine

## 2017-06-15 DIAGNOSIS — R911 Solitary pulmonary nodule: Secondary | ICD-10-CM

## 2017-07-08 ENCOUNTER — Telehealth: Payer: Self-pay | Admitting: Family Medicine

## 2017-07-08 ENCOUNTER — Telehealth: Payer: Self-pay

## 2017-07-08 NOTE — Telephone Encounter (Signed)
Copied from [place] [phone]. Topic: General - Other >> Jul 08, 2017  2:35 PM Yvette Rack wrote: Reason for CRM: patient calling stating that Dr Tamala Julian was suppose to have called him about the CT/MRI that he had at [facility] and also he has been waiting on the referral on CT chest that [facility] has called him about that either

## 2017-07-08 NOTE — Telephone Encounter (Signed)
Copied from [place] [phone]. Topic: General - Other >> Jul 08, 2017  2:35 PM Yvette Rack wrote: Reason for CRM: patient calling stating that Dr Tamala Julian was suppose to have called him about the CT/MRI that he had at [facility] and also he has been waiting on the referral on CT chest that [facility] has called him about that either   >> Jul 08, 2017  2:38 PM Yvette Rack wrote: Results on CT/MRI and a referral on CT of chest

## 2017-07-09 NOTE — Telephone Encounter (Signed)
Pt is having his ct chest done on 07/16/17

## 2017-07-09 NOTE — Telephone Encounter (Signed)
Please check status of CT Chest ordered on 06/15/17.  Also, I have reviewed the MRI of abdomen that was ordered by his urologist.  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  There are multiple splenic masses that have been stable and unchanged since 2013.  There is a partially imaged nodule along the RIGHT lower lung that has been present since 2013 also.

## 2017-07-09 NOTE — Telephone Encounter (Signed)
Message sent to Referrals to check on referrral.

## 2017-07-14 ENCOUNTER — Other Ambulatory Visit: Payer: Self-pay | Admitting: Family Medicine

## 2017-07-14 NOTE — Telephone Encounter (Signed)
Controlled medication. Thanks. 

## 2017-07-16 ENCOUNTER — Ambulatory Visit: Payer: Medicare Other

## 2017-07-16 NOTE — Telephone Encounter (Signed)
Please advise 

## 2017-07-16 NOTE — Telephone Encounter (Signed)
Please call patient to advise of MRI abdomen results.

## 2017-07-21 ENCOUNTER — Ambulatory Visit
Admission: RE | Admit: 2017-07-21 | Discharge: 2017-07-21 | Disposition: A | Discharge status: Home / Self Care | Payer: 59 | Source: Ambulatory Visit | Attending: Family Medicine | Admitting: Family Medicine

## 2017-07-21 ENCOUNTER — Telehealth: Payer: Self-pay | Admitting: Family Medicine

## 2017-07-21 DIAGNOSIS — R911 Solitary pulmonary nodule: Secondary | ICD-10-CM | POA: Diagnosis present

## 2017-07-21 DIAGNOSIS — R590 Localized enlarged lymph nodes: Secondary | ICD-10-CM | POA: Diagnosis not present

## 2017-07-21 DIAGNOSIS — R918 Other nonspecific abnormal finding of lung field: Secondary | ICD-10-CM | POA: Insufficient documentation

## 2017-07-21 LAB — POCT I-STAT CREATININE: CREATININE: 0.8 mg/dL (ref 0.61–1.24)

## 2017-07-21 MED ORDER — IOPAMIDOL (ISOVUE-300) INJECTION 61%
75.0000 mL | Freq: Once | INTRAVENOUS | Status: AC | PRN
Start: 2017-07-21 — End: 2017-07-21
  Administered 2017-07-21: 75 mL via INTRAVENOUS

## 2017-07-21 NOTE — Telephone Encounter (Signed)
Copied from [place]. Topic: General - Other >> Jul 21, 2017  3:02 PM Neva Seat wrote: [facility] Radiology called with  results of CT scan.  Call [place] [phone]

## 2017-07-22 NOTE — Telephone Encounter (Signed)
Ct  Scan  Chest   Done   07/21/2017  Results  Are  As  Follows . Enlarged  r   Axillary  Lymph nodes. Measuring up  To  1.8  CM  IN  Short  Axis. Given the  Multiple  Hypo dense  Foci in the  Spleen . 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  Less  Likely nodal metastataes.  Tissue  Sampling is  Recommended  For furthur  evaualtion . Previousley  descibed  Nodular  Density in  R  Lower  Lobe  With  Adjacent subpleural  Linear density  Is  favoured to  Represent scar as  There  Is  An  Adjacent  Old  Non  Displaced  r  Lateral   8th  Rib  fx  . There is  A  3.5 MM nodule in l  Lower lobe  No  followup needed  If patient is  Low  Risk . Non contrast  Chest  Ct  Can  Be  Considered  In  12  Months  If  Pt is  High  Risk .   Per  Fabiola Backer  RADIOLOGIST        PER telelophone  Call  Report     [place]ouch[place]

## 2017-07-22 NOTE — Telephone Encounter (Signed)
CT results sent from [facility] - forwarded to Dr. Tamala Julian -

## 2017-07-22 NOTE — Telephone Encounter (Signed)
Results are viewable in computer.

## 2017-07-22 NOTE — Telephone Encounter (Signed)
CT scan  Results   Oc  Chest    Done  11/28/ 2018  Are  As  Follows   Enlarged  R  Axillary   Lymph nodes   Measuring  up

## 2017-07-30 NOTE — Telephone Encounter (Signed)
Spoke with patient and also with wife and advised of CT results.  Recommend biopsy of enlarged axillary lymph node via interventional radiologist as appropriate.  Pt expressed understanding.

## 2017-08-02 ENCOUNTER — Telehealth: Payer: Self-pay | Admitting: Family Medicine

## 2017-08-02 NOTE — Telephone Encounter (Signed)
Copied from [place] [phone]. Topic: Quick Communication - See Telephone Encounter >> Aug 02, 2017  2:24 PM Ether Griffins B wrote: CRM for notification. See Telephone encounter for:  Pts wife calling wanting to know if pts biopsy has been scheduled.  08/02/17.

## 2017-08-03 ENCOUNTER — Other Ambulatory Visit: Payer: Self-pay | Admitting: Family Medicine

## 2017-08-03 DIAGNOSIS — R590 Localized enlarged lymph nodes: Secondary | ICD-10-CM

## 2017-08-03 NOTE — Progress Notes (Signed)
CT chest with enlarged RIGHT axillaryl lymph node measuring 1.8cm.

## 2017-08-03 NOTE — Telephone Encounter (Signed)
Referral to interventional radiologist at [facility] for axillary bx; spoke with [facility] radiology; recommended ordering US lymph node bx; placed in Epic and radiology team sees order; will contact pt to schedule.

## 2017-08-04 ENCOUNTER — Telehealth: Payer: Self-pay

## 2017-08-04 NOTE — Telephone Encounter (Signed)
Copied from [place] [phone]. Topic: Quick Communication - See Telephone Encounter >> Aug 02, 2017  2:24 PM Ether Griffins B wrote: CRM for notification. See Telephone encounter for:  Pts wife calling wanting to know if pts biopsy has been scheduled.  08/02/17. >> Aug 04, 2017  1:31 PM Neva Seat wrote: Pt's wife called back asking if the biopsy from the CT scan has been scheduled. Pt wants to speak with Dr. Tamala Julian only.

## 2017-08-05 NOTE — Telephone Encounter (Signed)
Duplicate encounter, closing note.  

## 2017-08-08 NOTE — Telephone Encounter (Signed)
Call --- appointment for biopsy is scheduled for 08/10/17 at 12:30.  Has patient been notified of this time yet.

## 2017-08-09 ENCOUNTER — Telehealth: Payer: Self-pay

## 2017-08-09 NOTE — Telephone Encounter (Signed)
Pt has been scheduled of an office visit on 08/11/2017 at 520pm.

## 2017-08-09 NOTE — Telephone Encounter (Signed)
Patient needs office visit before he can proceed with biopsy. Please call patient to schedule office visit.

## 2017-08-09 NOTE — Telephone Encounter (Signed)
Copied from [place] [phone]. Topic: Referral - Question >> Aug 09, 2017 10:00 AM Synthia Innocent wrote: Reason for CRM: Patient is schedule for lymph node BX by Dr Tamala Julian, must have history and physical within 30 days. They will have to cancel. Please advise >> Aug 09, 2017 10:03 AM Synthia Innocent wrote: They request office calls patient to make them aware

## 2017-08-09 NOTE — Telephone Encounter (Signed)
Pt has been informed of biopsy tomorrow at 1230. He verbalized understanding.

## 2017-08-10 ENCOUNTER — Ambulatory Visit: Admission: RE | Admit: 2017-08-10 | Payer: 59 | Source: Ambulatory Visit

## 2017-08-10 ENCOUNTER — Telehealth: Payer: Self-pay

## 2017-08-10 NOTE — Telephone Encounter (Signed)
Patient apologizing for call.  Things are not right at house.  Very distressed with appointment being cancelled for biopsy.  Has appointment with me tomorrow for pre-op visit prior to axillary node bx.  Called and  Left message with interventional radiology nurse coordinator requesting a return call regarding pre-op visit.

## 2017-08-10 NOTE — Telephone Encounter (Signed)
Copied from [place] [phone]. Topic: General - Other >> Aug 09, 2017  1:48 PM Yvette Rack wrote: Reason for CRM: patient is very upset he states that him and his wife have asked for Dr Tamala Julian to call them and she will never speak with them but will send a message for someone else to call he is very dissatisfied with the treatment from Dr Tamala Julian he has question and pt has an appointment tomorrow and want to speak with her

## 2017-08-11 ENCOUNTER — Ambulatory Visit: Payer: 59 | Admitting: Family Medicine

## 2017-08-13 ENCOUNTER — Ambulatory Visit
Admission: RE | Admit: 2017-08-13 | Discharge: 2017-08-13 | Disposition: A | Discharge status: Home / Self Care | Payer: 59 | Source: Ambulatory Visit | Attending: Family Medicine | Admitting: Family Medicine

## 2017-08-13 DIAGNOSIS — R590 Localized enlarged lymph nodes: Secondary | ICD-10-CM | POA: Insufficient documentation

## 2017-08-13 DIAGNOSIS — Z7982 Long term (current) use of aspirin: Secondary | ICD-10-CM | POA: Diagnosis not present

## 2017-08-13 DIAGNOSIS — E7800 Pure hypercholesterolemia, unspecified: Secondary | ICD-10-CM | POA: Insufficient documentation

## 2017-08-13 DIAGNOSIS — Z8546 Personal history of malignant neoplasm of prostate: Secondary | ICD-10-CM | POA: Insufficient documentation

## 2017-08-13 DIAGNOSIS — Z79899 Other long term (current) drug therapy: Secondary | ICD-10-CM | POA: Diagnosis not present

## 2017-08-13 DIAGNOSIS — E559 Vitamin D deficiency, unspecified: Secondary | ICD-10-CM | POA: Diagnosis not present

## 2017-08-13 DIAGNOSIS — Z87442 Personal history of urinary calculi: Secondary | ICD-10-CM | POA: Insufficient documentation

## 2017-08-13 DIAGNOSIS — I10 Essential (primary) hypertension: Secondary | ICD-10-CM | POA: Insufficient documentation

## 2017-08-13 DIAGNOSIS — G4733 Obstructive sleep apnea (adult) (pediatric): Secondary | ICD-10-CM | POA: Diagnosis not present

## 2017-08-13 MED ORDER — MIDAZOLAM HCL 5 MG/5ML IJ SOLN
INTRAMUSCULAR | Status: AC
  Filled 2017-08-13: qty 5

## 2017-08-13 MED ORDER — MIDAZOLAM HCL 5 MG/5ML IJ SOLN
INTRAMUSCULAR | Status: AC | PRN
  Administered 2017-08-13: 1 mg via INTRAVENOUS

## 2017-08-13 MED ORDER — SODIUM CHLORIDE 0.9 % IV SOLN
INTRAVENOUS | Status: DC
  Administered 2017-08-13: 11:00:00 via INTRAVENOUS

## 2017-08-13 MED ORDER — FENTANYL CITRATE (PF) 100 MCG/2ML IJ SOLN
INTRAMUSCULAR | Status: AC | PRN
  Administered 2017-08-13: 50 ug via INTRAVENOUS

## 2017-08-13 MED ORDER — FENTANYL CITRATE (PF) 100 MCG/2ML IJ SOLN
INTRAMUSCULAR | Status: AC
  Filled 2017-08-13: qty 4

## 2017-08-13 NOTE — H&P (Signed)
Chief Complaint:  RT AXILLARY ADENOPATHY  Referring Physician(s): Smith,Kristi M   History of Present Illness: Juan Chavez is a 66 y.o. male with new rt axillary adenopathy by CT concerning for lymphoproliferative disorder.  H/o prostate ca. No complaints today.    Past Medical History:  Diagnosis Date  . Allergic rhinitis, cause unspecified   . Allergy   . Chicken pox    childhood  . Essential hypertension, benign   . Hematuria, unspecified   . Impotence of organic origin   . Measles    childhood  . Mumps    childhood  . Nonspecific abnormal electrocardiogram (ECG) (EKG)   . Obstructive sleep apnea (adult) (pediatric)    CPAP  . Other abnormal glucose   . Other B-complex deficiencies   . Personal history of malignant neoplasm of prostate   . Personal history of urinary calculi   . Prostate cancer [facility]) 2005   Prostatectomy  . Pure hypercholesterolemia   . Unspecified urinary incontinence   . Unspecified vitamin D deficiency     Past Surgical History:  Procedure Laterality Date  . COLONOSCOPY  01/22/2013   normal.  [facility].  Repeat 5 years.  . ELBOW SURGERY    . PROSTATECTOMY  2005  . urological surgery  07/2009    Allergies: Patient has no known allergies.  Medications: Prior to Admission medications   Medication Sig Start Date End Date Taking? Authorizing Provider  ALPRAZolam Duanne Moron) 0.5 MG tablet TAKE 1 TABLET BY MOUTH EVERY DAY AS NEEDED FOR ANXIETY 07/16/17  Yes [name], MD  aspirin 81 MG tablet Take 81 mg by mouth daily.   Yes [provider]  cholecalciferol (VITAMIN D) 1000 UNITS tablet Take 1,000 Units by mouth 2 (two) times daily.   Yes [provider]  cloNIDine (CATAPRES) 0.1 MG tablet Take 1 tablet (0.1 mg total) by mouth 2 (two) times daily. 01/21/17  Yes [name], MD  Cyanocobalamin (VITAMIN B-12 PO) Take by mouth daily.   Yes [provider]  diltiazem (CARDIZEM CD) 180 MG 24 hr capsule TAKE 1  CAPSULE (180 MG TOTAL) BY MOUTH 2 (TWO) TIMES DAILY. 02/14/17  Yes [name], MD  fluticasone (FLONASE) 50 MCG/ACT nasal spray PLACE 2 SPRAYS INTO BOTH NOSTRILS DAILY. 01/21/17  Yes [name], MD  loratadine (CLARITIN) 10 MG tablet TAKE 1 TABLET (10 MG TOTAL) BY MOUTH DAILY. 01/21/17  Yes [name], MD  mirabegron ER (MYRBETRIQ) 50 MG TB24 tablet Take 1 tablet (50 mg total) by mouth daily. For urinary incontinence 05/18/17  Yes [name], MD  oxybutynin (DITROPAN-XL) 5 MG 24 hr tablet Take 1 tablet (5 mg total) by mouth at bedtime. 05/21/17  Yes [name], MD  tadalafil (CIALIS) 20 MG tablet TAKE 1 TABLET (20 MG TOTAL) BY MOUTH DAILY AS NEEDED FOR ERECTILE DYSFUNCTION. 01/21/17  Yes [name], MD  valsartan (DIOVAN) 320 MG tablet Take 1 tablet (320 mg total) by mouth daily. 01/21/17  Yes [name], MD  ciprofloxacin (CIPRO) 500 MG tablet Take 1 tablet (500 mg total) by mouth 2 (two) times daily. Patient not taking: Reported on 08/13/2017 04/27/17   [name], MD     Family History  Problem Relation Age of Onset  . Heart disease Father 32       AMI age 20; CM.  Marland Kitchen Hypertension Father   . Mental illness Sister  home in [place]  . Alcohol abuse Sister   . Hypertension Mother   . Heart disease Mother        AMI years ago; no CABG/stents.  . Arthritis Mother   . Dementia Mother   . Heart disease Brother 5       AMI; CM    Social History   Socioeconomic History  . Marital status: Married    Spouse name: None  . Number of children: 2  . Years of education: None  . Highest education level: None  Social Needs  . Financial resource strain: None  . Food insecurity - worry: None  . Food insecurity - inability: None  . Transportation needs - medical: None  . Transportation needs - non-medical: None  Occupational History  . Occupation: [employer]  Tobacco Use  . Smoking status: Never Smoker  . Smokeless tobacco: Never Used    Substance and Sexual Activity  . Alcohol use: Yes    Alcohol/week: 0.0 oz    Comment: occasional drinks beer 1 per week  . Drug use: No  . Sexual activity: Yes    Partners: Female  Other Topics Concern  . None  Social History Narrative   Marital status:  Married x 66 years happily married.      Children:  2 children; 4 grandchildren.      Lives: with wife.  Children local.      Employment: [employer] x 46 years.  Plans to retire unknown.       Tobacco:  Never.      Alcohol: rare wine.      Drugs: none      Exercise: moderate, walking and running daily for 15 minutes.      Seatbelt:  Always uses seat belts.       Smoke alarm and carbon monoxide detector in the home.      Caffeine use: none.       Guns:  1 gun loaded; others unloaded.            Review of Systems: A 12 point ROS discussed and pertinent positives are indicated in the HPI above.  All other systems are negative.  Review of Systems  Vital Signs: BP (!) 173/95   Pulse (!) 58   Temp 98.5 F (36.9 C) (Oral)   Resp 18   Ht 5' 11.5" (1.816 m)   Wt 198 lb (89.8 kg)   SpO2 97%   BMI 27.23 kg/m   Physical Exam  Constitutional: He is oriented to person, place, and time. He appears well-developed and well-nourished. No distress.  Eyes: Conjunctivae are normal. No scleral icterus.  Cardiovascular: Normal rate, regular rhythm and normal heart sounds.  Pulmonary/Chest: Effort normal and breath sounds normal.  Abdominal: Soft. Bowel sounds are normal. He exhibits no distension.  Musculoskeletal: He exhibits no edema.  Neurological: He is alert and oriented to person, place, and time.  Skin: He is not diaphoretic.    Imaging: Ct Chest W Contrast  Result Date: 07/21/2017 CLINICAL DATA:  Evaluate right lower lobe lung nodule seen on recent CT abdomen and pelvis. Nonsmoker. No chest complaints. EXAM: CT CHEST WITH CONTRAST TECHNIQUE: Multidetector CT imaging of the chest was performed during intravenous contrast  administration. CONTRAST:  56mL ISOVUE-300 IOPAMIDOL (ISOVUE-300) INJECTION 61% COMPARISON:  CT abdomen pelvis dated June 07, 2017. FINDINGS: Cardiovascular: No significant vascular findings. Normal heart size. No pericardial effusion. Normal caliber thoracic aorta. Coronary artery atherosclerotic vascular calcifications. Mediastinum/Nodes: Enlarged  right axillary lymph nodes measuring up to 1.8 cm in short axis. No mediastinal or hilar lymphadenopathy. Thyroid gland, trachea, and esophagus demonstrate no significant abnormalities. Lungs/Pleura: The previously described nodular density in the right lower lobe with adjacent subpleural linear density is favored to represent scar. There is a 5 mm nodule in the left lower lobe (series 3, image 125). Upper Abdomen: No acute abnormality. Multiple hypodense foci in the spleen are similar to prior CT. Musculoskeletal: Old nondisplaced right lateral eighth rib fracture. No acute or significant osseous findings. 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 IMPRESSION: 1. Enlarged right axillary lymph nodes measuring up to 1.8 cm in short axis. Given the multiple hypodense foci in the spleen, findings are concerning for lymphoma, less likely nodal metastases. Tissue sampling is recommended for further evaluation. 2. Previously described nodular density in the right lower lobe with adjacent subpleural linear density is favored to represent scar, as there is an adjacent old nondisplaced right lateral eighth rib fracture. 3. 5 mm nodule in the left lower lobe. No follow-up needed if patient is low-risk. Non-contrast chest CT can be considered in 12 months if patient is high-risk. This recommendation follows the consensus statement: Guidelines for Management of Incidental Pulmonary Nodules Detected on CT Images: From the Fleischner Society 2017; Radiology 2017; 284:228-243. These results will be called to the ordering  clinician or representative by the Radiologist Assistant, and communication documented in the PACS or zVision Dashboard. Electronically Signed   By: Titus Dubin M.D.   On: 07/21/2017 14:18    Labs:  CBC: Recent Labs    10/27/16 1739 01/21/17 0857 04/27/17 0907  WBC 8.0 6.6 8.0  HGB 13.7 13.7 14.3  HCT 38.2 39.4 42.6  PLT 179 191 189    COAGS: No results for input(s): INR, APTT in the last 8760 hours.  BMP: Recent Labs    10/27/16 1739 01/21/17 0857 04/27/17 0907 05/18/17 1009 07/21/17 0903  NA 141 142 141 142  --   K 3.6 3.7 3.9 4.0  --   CL 101 106 103 103  --   CO2 25 22 25 26   --   GLUCOSE 93 139* 123* 114*  --   BUN 12 14 18 12   --   CALCIUM 8.8 9.3 9.2 9.3  --   CREATININE 0.83 0.89 0.83 0.82 0.80  GFRNONAA 92 89 92 92  --   GFRAA 106 103 106 106  --     LIVER FUNCTION TESTS: Recent Labs    10/27/16 1739 01/21/17 0857 04/27/17 0907 05/18/17 1009  BILITOT 0.5 0.6 0.5 0.6  AST 21 15 16 19   ALT 25 22 18 23   ALKPHOS 86 82 84 79  PROT 7.3 7.4 7.3 7.0  ALBUMIN 4.3 4.2 4.5 4.3    TUMOR MARKERS: No results for input(s): AFPTM, CEA, CA199, CHROMGRNA in the last 8760 hours.  Assessment and Plan:  Plan for [place] rt axillary node core bx today.  Risks and benefits discussed with the patient including, but not limited to bleeding, infection, damage to adjacent structures or low yield requiring additional tests. All of the patient's questions were answered, patient is agreeable to proceed. Consent signed and in chart.    Thank you for this interesting consult.  I greatly enjoyed meeting Meredith DAJION BICKFORD and look forward to participating in their care.  A copy of this report was sent to the requesting provider on this  date.  Electronically Signed: Greggory Keen, MD 08/13/2017, 11:22 AM   I spent a total of  15 Minutes   in face to face in clinical consultation, greater than 50% of which was counseling/coordinating care for this patient with rt  axillary adenopathy.

## 2017-08-13 NOTE — Procedures (Signed)
RT axillary adenopathy  S/P [place] RT AXILLARY NODE CORE BXS  NO COMP STABLE PATH PENDING FULL REPORT IN PACS

## 2017-08-16 LAB — SURGICAL PATHOLOGY

## 2017-08-18 ENCOUNTER — Encounter: Payer: Self-pay | Admitting: Interventional Radiology

## 2017-08-19 ENCOUNTER — Telehealth: Payer: Self-pay | Admitting: Family Medicine

## 2017-08-19 DIAGNOSIS — R590 Localized enlarged lymph nodes: Secondary | ICD-10-CM

## 2017-08-19 NOTE — Telephone Encounter (Signed)
Copied from [place] [id number]. Topic: Quick Communication - Lab Results >> Aug 19, 2017  4:24 PM Cecelia Byars, NT wrote: Reason for CRM: Patient called wanting results of biopsy done last week please call  [phone]  [phone]

## 2017-08-25 NOTE — Telephone Encounter (Signed)
Path report request sent to Dr. Tamala Julian.

## 2017-08-27 NOTE — Telephone Encounter (Signed)
Dillon advised patient of negative biopsy results on 08/23/17 per imaging procedure results.  Called patient; pathology negative.  Discussed two treatment options: 1. Referral to general surgery for excision 2.  Repeat imaging in 3 months.  Recommend referral to general surgery for excision due to history of prostate cancer.  Referral placed. Pt expressed understanding.

## 2017-09-14 ENCOUNTER — Telehealth: Payer: Self-pay | Admitting: Family Medicine

## 2017-09-14 NOTE — Telephone Encounter (Signed)
Copied from [place]. Topic: Quick Communication - Rx Refill/Question >> Sep 14, 2017  6:30 PM Juan Chavez wrote: Medication: valsartan (DIOVAN) 320 MG tablet [[id number]]  Has the patient contacted their pharmacy? Yes.   (Agent: If no, request that the patient contact the pharmacy for the refill.) Preferred Pharmacy (with phone number or street name): [facility] [place]: Please be advised that RX refills may take up to 3 business days. We ask that you follow-up with your pharmacy.  Pt states that he has received a letter that his Rx has been recalled and is needing a different medication, contact pharmacy or pt

## 2017-09-17 MED ORDER — OLMESARTAN MEDOXOMIL 40 MG PO TABS: 40.0000 mg | ORAL_TABLET | Freq: Every day | ORAL | 1 refills | Status: DC

## 2017-09-17 NOTE — Telephone Encounter (Signed)
Call --- I have sent in Olmesartan 40mg  one tablet daily.  This is to REPLACE Valsartan.  Please have patient stop Valsartan and start Olmesartan.

## 2017-09-17 NOTE — Telephone Encounter (Signed)
Informed pt that a new Rx has been sent to pharmacy and should be really for him to pick up.

## 2017-09-19 NOTE — Telephone Encounter (Signed)
This encounter was created in error - please disregard.

## 2017-10-27 ENCOUNTER — Ambulatory Visit: Payer: 59 | Admitting: Family Medicine

## 2017-10-29 ENCOUNTER — Encounter: Payer: 59 | Admitting: Family Medicine

## 2017-10-29 ENCOUNTER — Encounter: Payer: Self-pay | Admitting: Family Medicine

## 2017-10-29 ENCOUNTER — Other Ambulatory Visit: Payer: Self-pay

## 2017-10-29 ENCOUNTER — Ambulatory Visit (INDEPENDENT_AMBULATORY_CARE_PROVIDER_SITE_OTHER): Payer: 59 | Admitting: Family Medicine

## 2017-10-29 VITALS — BP 146/82 | HR 69 | Temp 98.0°F | Resp 16 | Ht 69.49 in | Wt 198.0 lb

## 2017-10-29 DIAGNOSIS — Z636 Dependent relative needing care at home: Secondary | ICD-10-CM

## 2017-10-29 DIAGNOSIS — R590 Localized enlarged lymph nodes: Secondary | ICD-10-CM | POA: Diagnosis not present

## 2017-10-29 DIAGNOSIS — R7309 Other abnormal glucose: Secondary | ICD-10-CM | POA: Diagnosis not present

## 2017-10-29 DIAGNOSIS — Z6828 Body mass index (BMI) 28.0-28.9, adult: Secondary | ICD-10-CM

## 2017-10-29 DIAGNOSIS — H905 Unspecified sensorineural hearing loss: Secondary | ICD-10-CM | POA: Diagnosis not present

## 2017-10-29 DIAGNOSIS — N393 Stress incontinence (female) (male): Secondary | ICD-10-CM

## 2017-10-29 DIAGNOSIS — E669 Obesity, unspecified: Secondary | ICD-10-CM | POA: Diagnosis not present

## 2017-10-29 DIAGNOSIS — J301 Allergic rhinitis due to pollen: Secondary | ICD-10-CM

## 2017-10-29 DIAGNOSIS — Z0000 Encounter for general adult medical examination without abnormal findings: Secondary | ICD-10-CM | POA: Diagnosis not present

## 2017-10-29 DIAGNOSIS — I10 Essential (primary) hypertension: Secondary | ICD-10-CM

## 2017-10-29 DIAGNOSIS — H903 Sensorineural hearing loss, bilateral: Secondary | ICD-10-CM

## 2017-10-29 LAB — POCT URINALYSIS DIP (MANUAL ENTRY)
BILIRUBIN UA: NEGATIVE
BILIRUBIN UA: NEGATIVE mg/dL
Glucose, UA: NEGATIVE mg/dL
LEUKOCYTES UA: NEGATIVE
Nitrite, UA: NEGATIVE
Protein Ur, POC: NEGATIVE mg/dL
Spec Grav, UA: 1.015 (ref 1.010–1.025)
Urobilinogen, UA: 1 E.U./dL
pH, UA: 7.5 (ref 5.0–8.0)

## 2017-10-29 MED ORDER — LORATADINE 10 MG PO TABS: ORAL_TABLET | ORAL | 3 refills | Status: AC

## 2017-10-29 MED ORDER — FLUTICASONE PROPIONATE 50 MCG/ACT NA SUSP: NASAL | 11 refills | Status: AC

## 2017-10-29 MED ORDER — CLONIDINE HCL 0.1 MG PO TABS: 0.1000 mg | ORAL_TABLET | Freq: Two times a day (BID) | ORAL | 3 refills | Status: DC

## 2017-10-29 MED ORDER — ALPRAZOLAM 0.5 MG PO TABS: ORAL_TABLET | ORAL | 3 refills | Status: AC

## 2017-10-29 MED ORDER — TADALAFIL 20 MG PO TABS: ORAL_TABLET | ORAL | 11 refills | Status: AC

## 2017-10-29 MED ORDER — OLMESARTAN MEDOXOMIL 40 MG PO TABS: 40.0000 mg | ORAL_TABLET | Freq: Every day | ORAL | 1 refills | Status: DC

## 2017-10-29 MED ORDER — DILTIAZEM HCL ER COATED BEADS 180 MG PO CP24: 180.0000 mg | ORAL_CAPSULE | Freq: Two times a day (BID) | ORAL | 3 refills | Status: DC

## 2017-10-29 MED ORDER — OXYBUTYNIN CHLORIDE ER 5 MG PO TB24: 5.0000 mg | ORAL_TABLET | Freq: Every day | ORAL | 1 refills | Status: DC

## 2017-10-29 NOTE — Patient Instructions (Addendum)
   IF you received an x-ray today, you will receive an invoice from [facility]. Please contact [facility] at [phone] with questions or concerns regarding your invoice.   IF you received labwork today, you will receive an invoice from [facility]. Please contact [facility] at [phone] with questions or concerns regarding your invoice.   Our billing staff will not be able to assist you with questions regarding bills from these companies.  You will be contacted with the lab results as soon as they are available. The fastest way to get your results is to activate your My Chart account. Instructions are located on the last page of this paperwork. If you have not heard from us regarding the results in 2 weeks, please contact this office.      Preventive Care 68 Years and Older, Male Preventive care refers to lifestyle choices and visits with your health care provider that can promote health and wellness. What does preventive care include?  A yearly physical exam. This is also called an annual well check.  Dental exams once or twice a year.  Routine eye exams. Ask your health care provider how often you should have your eyes checked.  Personal lifestyle choices, including: ? Daily care of your teeth and gums. ? Regular physical activity. ? Eating a healthy diet. ? Avoiding tobacco and drug use. ? Limiting alcohol use. ? Practicing safe sex. ? Taking low doses of aspirin every day. ? Taking vitamin and mineral supplements as recommended by your health care provider. What happens during an annual well check? The services and screenings done by your health care provider during your annual well check will depend on your age, overall health, lifestyle risk factors, and family history of disease. Counseling Your health care provider may ask you questions about your:  Alcohol use.  Tobacco use.  Drug use.  Emotional well-being.  Home and relationship  well-being.  Sexual activity.  Eating habits.  History of falls.  Memory and ability to understand (cognition).  Work and work environment.  Screening You may have the following tests or measurements:  Height, weight, and BMI.  Blood pressure.  Lipid and cholesterol levels. These may be checked every 5 years, or more frequently if you are over 50 years old.  Skin check.  Lung cancer screening. You may have this screening every year starting at age 55 if you have a 30-pack-year history of smoking and currently smoke or have quit within the past 15 years.  Fecal occult blood test (FOBT) of the stool. You may have this test every year starting at age 50.  Flexible sigmoidoscopy or colonoscopy. You may have a sigmoidoscopy every 5 years or a colonoscopy every 10 years starting at age 50.  Prostate cancer screening. Recommendations will vary depending on your family history and other risks.  Hepatitis C blood test.  Hepatitis B blood test.  Sexually transmitted disease (STD) testing.  Diabetes screening. This is done by checking your blood sugar (glucose) after you have not eaten for a while (fasting). You may have this done every 1-3 years.  Abdominal aortic aneurysm (AAA) screening. You may need this if you are a current or former smoker.  Osteoporosis. You may be screened starting at age 70 if you are at high risk.  Talk with your health care provider about your test results, treatment options, and if necessary, the need for more tests. Vaccines Your health care provider may recommend certain vaccines, such as:  Influenza vaccine. This   is recommended every year.  Tetanus, diphtheria, and acellular pertussis (Tdap, Td) vaccine. You may need a Td booster every 10 years.  Varicella vaccine. You may need this if you have not been vaccinated.  Zoster vaccine. You may need this after age 60.  Measles, mumps, and rubella (MMR) vaccine. You may need at least one dose of  MMR if you were born in 1957 or later. You may also need a second dose.  Pneumococcal 13-valent conjugate (PCV13) vaccine. One dose is recommended after age 65.  Pneumococcal polysaccharide (PPSV23) vaccine. One dose is recommended after age 65.  Meningococcal vaccine. You may need this if you have certain conditions.  Hepatitis A vaccine. You may need this if you have certain conditions or if you travel or work in places where you may be exposed to hepatitis A.  Hepatitis B vaccine. You may need this if you have certain conditions or if you travel or work in places where you may be exposed to hepatitis B.  Haemophilus influenzae type b (Hib) vaccine. You may need this if you have certain risk factors.  Talk to your health care provider about which screenings and vaccines you need and how often you need them. This information is not intended to replace advice given to you by your health care provider. Make sure you discuss any questions you have with your health care provider. Document Released: 09/06/2015 Document Revised: 04/29/2016 Document Reviewed: 06/11/2015 Elsevier Interactive Patient Education  2018 [employer]  

## 2017-10-29 NOTE — Progress Notes (Signed)
Subjective:    Patient ID: Juan Chavez, male    DOB: 31-Aug-1950, 67 y.o.   MRN: [id number]  10/29/2017  Annual Exam    HPI This 67 y.o. male presents for Complete Physical Examination.  Last physical:  01-21-2017 Colonoscopy:  2015  Mother with dementia.  Declining steadily.  Hospice came in.  Diaphragm is elevated; interfering with breathing; now on oxygen.  Hospice is there daily.  Someone with her at all time.   LAD: s/p general surgery consultation; no lymph node to bx.  Referred to hematology/oncology, no evidence of lymphoma.  Consider repeating CT scan.  Asymptomatic.      Visual Acuity Screening   Right eye Left eye Both eyes  Without correction:     With correction: 20/25 20/25 20/20     BP Readings from Last 3 Encounters:  10/29/17 (!) 146/82  08/13/17 (!) 157/80  05/18/17 138/82   Wt Readings from Last 3 Encounters:  10/29/17 198 lb (89.8 kg)  08/13/17 198 lb (89.8 kg)  05/18/17 203 lb (92.1 kg)   Immunization History  Administered Date(s) Administered  . Influenza Split 04/24/2012  . Influenza,inj,Quad PF,6+ Mos 05/29/2013, 07/02/2014, 07/07/2016, 04/27/2017  . Influenza-Unspecified 05/29/2013, 07/02/2014, 05/09/2015, 07/07/2016, 04/27/2017  . Pneumococcal Conjugate-13 09/02/2015  . Pneumococcal Polysaccharide-23 10/27/2016  . Td 04/28/2011  . Tdap 08/25/2007  . Zoster Recombinat (Shingrix) 01/21/2017   Health Maintenance  Topic Date Due  . OPHTHALMOLOGY EXAM  05/10/1960  . HEMOGLOBIN A1C  05/01/2018  . FOOT EXAM  05/18/2018  . COLONOSCOPY  01/23/2019  . TETANUS/TDAP  04/27/2021  . INFLUENZA VACCINE  Completed  . Hepatitis C Screening  Completed  . PNA vac Low Risk Adult  Completed   Review of Systems  Constitutional: Negative for activity change, appetite change, chills, diaphoresis, fatigue, fever and unexpected weight change.  HENT: Negative for congestion, dental problem, drooling, ear discharge, ear pain, facial swelling, hearing loss,  mouth sores, nosebleeds, postnasal drip, rhinorrhea, sinus pressure, sneezing, sore throat, tinnitus, trouble swallowing and voice change.   Eyes: Negative for photophobia, pain, discharge, redness, itching and visual disturbance.  Respiratory: Negative for apnea, cough, choking, chest tightness, shortness of breath, wheezing and stridor.   Cardiovascular: Negative for chest pain, palpitations and leg swelling.  Gastrointestinal: Negative for abdominal pain, blood in stool, constipation, diarrhea, nausea and vomiting.  Endocrine: Negative for cold intolerance, heat intolerance, polydipsia, polyphagia and polyuria.  Genitourinary: Negative for decreased urine volume, difficulty urinating, discharge, dysuria, enuresis, flank pain, frequency, genital sores, hematuria, penile pain, penile swelling, scrotal swelling, testicular pain and urgency.  Musculoskeletal: Negative for arthralgias, back pain, gait problem, joint swelling, myalgias, neck pain and neck stiffness.  Skin: Negative for color change, pallor, rash and wound.  Allergic/Immunologic: Negative for environmental allergies, food allergies and immunocompromised state.  Neurological: Negative for dizziness, tremors, seizures, syncope, facial asymmetry, speech difficulty, weakness, light-headedness, numbness and headaches.  Hematological: Negative for adenopathy. Does not bruise/bleed easily.  Psychiatric/Behavioral: Negative for agitation, behavioral problems, confusion, decreased concentration, dysphoric mood, hallucinations, self-injury, sleep disturbance and suicidal ideas. The patient is not nervous/anxious and is not hyperactive.     Past Medical History:  Diagnosis Date  . Allergic rhinitis, cause unspecified   . Allergy   . Chicken pox    childhood  . Essential hypertension, benign   . Hematuria, unspecified   . Impotence of organic origin   . Measles    childhood  . Mumps    childhood  . Nonspecific abnormal electrocardiogram  (  ECG) (EKG)   . Obstructive sleep apnea (adult) (pediatric)    CPAP  . Other abnormal glucose   . Other B-complex deficiencies   . Personal history of malignant neoplasm of prostate   . Personal history of urinary calculi   . Prostate cancer [facility]) 2005   Prostatectomy  . Pure hypercholesterolemia   . Unspecified urinary incontinence   . Unspecified vitamin D deficiency    Past Surgical History:  Procedure Laterality Date  . COLONOSCOPY  01/22/2013   normal.  [facility].  Repeat 5 years.  . ELBOW SURGERY    . PROSTATECTOMY  2005  . urological surgery  07/2009   No Known Allergies Current Outpatient Medications on File Prior to Visit  Medication Sig Dispense Refill  . aspirin 81 MG tablet Take 81 mg by mouth daily.    . cholecalciferol (VITAMIN D) 1000 UNITS tablet Take 1,000 Units by mouth 2 (two) times daily.    . Cyanocobalamin (VITAMIN B-12 PO) Take by mouth daily.    . mirabegron ER (MYRBETRIQ) 50 MG TB24 tablet Take 1 tablet (50 mg total) by mouth daily. For urinary incontinence 30 tablet 1  . Zoster Vaccine Adjuvanted (SHINGRIX) injection INJECT INTO THE MUSCLE ONCE.     No current facility-administered medications on file prior to visit.    Social History   Socioeconomic History  . Marital status: Married    Spouse name: Not on file  . Number of children: 2  . Years of education: Not on file  . Highest education level: Not on file  Occupational History  . Occupation: [employer]  Social Needs  . Financial resource strain: Not on file  . Food insecurity:    Worry: Not on file    Inability: Not on file  . Transportation needs:    Medical: Not on file    Non-medical: Not on file  Tobacco Use  . Smoking status: Never Smoker  . Smokeless tobacco: Never Used  Substance and Sexual Activity  . Alcohol use: Yes    Alcohol/week: 0.0 oz    Comment: occasional drinks beer 1 per week  . Drug use: No  . Sexual activity: Yes    Partners: Female  Lifestyle   . Physical activity:    Days per week: Not on file    Minutes per session: Not on file  . Stress: Not on file  Relationships  . Social connections:    Talks on phone: Not on file    Gets together: Not on file    Attends religious service: Not on file    Active member of club or organization: Not on file    Attends meetings of clubs or organizations: Not on file    Relationship status: Not on file  . Intimate partner violence:    Fear of current or ex partner: Not on file    Emotionally abused: Not on file    Physically abused: Not on file    Forced sexual activity: Not on file  Other Topics Concern  . Not on file  Social History Narrative   Marital status:  Married x 61 years happily married.      Children:  2 children; 4 grandchildren.      Lives: with wife.  Children local.      Employment: [employer] x 46 years.  Plans to retire unknown.       Tobacco:  Never.      Alcohol: rare wine.  Drugs: none      Exercise: moderate, walking and running daily for 15 minutes.      Seatbelt:  Always uses seat belts.       Smoke alarm and carbon monoxide detector in the home.      Caffeine use: none.       Guns:  1 gun loaded; others unloaded.         Family History  Problem Relation Age of Onset  . Heart disease Father 27       AMI age 39; CM.  Marland Kitchen Hypertension Father   . Mental illness Sister        home in [place]  . Alcohol abuse Sister   . Hypertension Mother   . Heart disease Mother        AMI years ago; no CABG/stents.  . Arthritis Mother   . Dementia Mother   . Heart disease Brother 75       AMI; CM       Objective:    BP (!) 146/82   Pulse 69   Temp 98 F (36.7 C) (Oral)   Resp 16   Ht 5' 9.49" (1.765 m)   Wt 198 lb (89.8 kg)   SpO2 98%   BMI 28.83 kg/m  Physical Exam  Constitutional: He is oriented to person, place, and time. He appears well-developed and well-nourished. No distress.  HENT:  Head: Normocephalic and atraumatic.  Right Ear: External ear  normal.  Left Ear: External ear normal.  Nose: Nose normal.  Mouth/Throat: Oropharynx is clear and moist.  Eyes: Conjunctivae and EOM are normal. Pupils are equal, round, and reactive to light.  Neck: Normal range of motion. Neck supple. Carotid bruit is not present. No thyromegaly present.  Cardiovascular: Normal rate, regular rhythm, normal heart sounds and intact distal pulses. Exam reveals no gallop and no friction rub.  No murmur heard. Pulmonary/Chest: Effort normal and breath sounds normal. He has no wheezes. He has no rales.  Abdominal: Soft. Bowel sounds are normal. He exhibits no distension and no mass. There is no tenderness. There is no rebound and no guarding.  Genitourinary: Penis normal.  Musculoskeletal:       Right shoulder: Normal.       Left shoulder: Normal.       Cervical back: Normal.  Lymphadenopathy:    He has no cervical adenopathy.  Neurological: He is alert and oriented to person, place, and time. He has normal reflexes. No cranial nerve deficit. He exhibits normal muscle tone. Coordination normal.  Skin: Skin is warm and dry. No rash noted. He is not diaphoretic.  Psychiatric: He has a normal mood and affect. His behavior is normal. Judgment and thought content normal.   No results found. Depression screen [facility] 2/9 10/29/2017 05/18/2017 04/27/2017 04/14/2017 01/21/2017  Decreased Interest 0 0 0 0 0  Down, Depressed, Hopeless 0 0 0 0 0  PHQ - 2 Score 0 0 0 0 0   Fall Risk  10/29/2017 05/18/2017 04/27/2017 04/14/2017 01/21/2017  Falls in the past year? No No No No No        Assessment & Plan:   1. Routine physical examination   2. Essential hypertension, benign   3. ASNHL (asymmetrical sensorineural hearing loss)   4. Other abnormal glucose   5. Obesity (BMI 30.0-34.9)   6. Genuine stress incontinence, male   7. LAD (lymphadenopathy), axillary   8. Seasonal allergic rhinitis due to pollen   9. Caregiver stress  10. BMI 28.0-28.9,adult    -anticipatory  guidance provided --- exercise, weight loss, safe driving practices, aspirin 81mg  daily. -obtain age appropriate screening labs and labs for chronic disease management. -Controlled chronic medical conditions; obtain labs;  -S/p general surgery and oncology consultation for axillary LAD; no evidence of malignancy; observation only recommended. -Suffering with caregiver stress; providing to care to mother with dementia.  Rx for Xanax provided. -recommend weight loss, exercise for 30-60 minutes five days per week; recommend 1200 kcal restriction per day with a minimum of 60 grams of protein per day.   Orders Placed This Encounter  Procedures  . CBC with Differential/Platelet  . Comprehensive metabolic panel    Order Specific Question:   Has the patient fasted?    Answer:   No  . Hemoglobin A1c  . Lipid panel    Order Specific Question:   Has the patient fasted?    Answer:   No  . TSH  . POCT urinalysis dipstick  . EKG 12-Lead   Meds ordered this encounter  Medications  . olmesartan (BENICAR) 40 MG tablet    Sig: Take 1 tablet (40 mg total) by mouth daily.    Dispense:  90 tablet    Refill:  1  . oxybutynin (DITROPAN-XL) 5 MG 24 hr tablet    Sig: Take 1 tablet (5 mg total) by mouth at bedtime.    Dispense:  90 tablet    Refill:  1  . fluticasone (FLONASE) 50 MCG/ACT nasal spray    Sig: PLACE 2 SPRAYS INTO BOTH NOSTRILS DAILY.    Dispense:  16 g    Refill:  11  . loratadine (CLARITIN) 10 MG tablet    Sig: TAKE 1 TABLET (10 MG TOTAL) BY MOUTH DAILY.    Dispense:  90 tablet    Refill:  3  . cloNIDine (CATAPRES) 0.1 MG tablet    Sig: Take 1 tablet (0.1 mg total) by mouth 2 (two) times daily.    Dispense:  180 tablet    Refill:  3  . tadalafil (CIALIS) 20 MG tablet    Sig: TAKE 1 TABLET (20 MG TOTAL) BY MOUTH DAILY AS NEEDED FOR ERECTILE DYSFUNCTION.    Dispense:  8 tablet    Refill:  11  . diltiazem (CARDIZEM CD) 180 MG 24 hr capsule    Sig: Take 1 capsule (180 mg total) by  mouth 2 (two) times daily.    Dispense:  180 capsule    Refill:  3  . ALPRAZolam (XANAX) 0.5 MG tablet    Sig: TAKE 1 TABLET BY MOUTH EVERY DAY AS NEEDED FOR ANXIETY    Dispense:  30 tablet    Refill:  3    This request is for a new prescription for a controlled substance as required by Federal/State law.    Return in about 3 months (around 01/29/2018) for follow-up chronic medical conditions.   Sherman Donaldson Elayne Guerin, M.D. Primary [facility] at [facility] [address] [place], [place]  [address] [phone] phone [phone] fax

## 2017-10-30 LAB — COMPREHENSIVE METABOLIC PANEL
ALBUMIN: 4.3 g/dL (ref 3.6–4.8)
ALT: 18 IU/L (ref 0–44)
AST: 15 IU/L (ref 0–40)
Albumin/Globulin Ratio: 1.7 (ref 1.2–2.2)
Alkaline Phosphatase: 87 IU/L (ref 39–117)
BUN / CREAT RATIO: 17 (ref 10–24)
BUN: 14 mg/dL (ref 8–27)
Bilirubin Total: 0.5 mg/dL (ref 0.0–1.2)
CALCIUM: 8.7 mg/dL (ref 8.6–10.2)
CO2: 23 mmol/L (ref 20–29)
Chloride: 104 mmol/L (ref 96–106)
Creatinine, Ser: 0.81 mg/dL (ref 0.76–1.27)
GFR, EST AFRICAN AMERICAN: 106 mL/min/{1.73_m2} (ref >59)
GFR, EST NON AFRICAN AMERICAN: 92 mL/min/{1.73_m2} (ref >59)
Globulin, Total: 2.5 g/dL (ref 1.5–4.5)
Glucose: 106 mg/dL — ABNORMAL HIGH (ref 65–99)
Potassium: 3.8 mmol/L (ref 3.5–5.2)
Sodium: 142 mmol/L (ref 134–144)
TOTAL PROTEIN: 6.8 g/dL (ref 6.0–8.5)

## 2017-10-30 LAB — CBC WITH DIFFERENTIAL/PLATELET
Basophils Absolute: 0 10*3/uL (ref 0.0–0.2)
Basos: 0 %
EOS (ABSOLUTE): 0.1 10*3/uL (ref 0.0–0.4)
EOS: 1 %
HEMOGLOBIN: 13.7 g/dL (ref 13.0–17.7)
Hematocrit: 39.1 % (ref 37.5–51.0)
IMMATURE GRANS (ABS): 0 10*3/uL (ref 0.0–0.1)
IMMATURE GRANULOCYTES: 0 %
Lymphocytes Absolute: 1.7 10*3/uL (ref 0.7–3.1)
Lymphs: 22 %
MCH: 30.4 pg (ref 26.6–33.0)
MCHC: 35 g/dL (ref 31.5–35.7)
MCV: 87 fL (ref 79–97)
MONOCYTES: 6 %
Monocytes Absolute: 0.5 10*3/uL (ref 0.1–0.9)
NEUTROS ABS: 5.5 10*3/uL (ref 1.4–7.0)
NEUTROS PCT: 71 %
PLATELETS: 170 10*3/uL (ref 150–379)
RBC: 4.51 x10E6/uL (ref 4.14–5.80)
RDW: 13.2 % (ref 12.3–15.4)
WBC: 7.8 10*3/uL (ref 3.4–10.8)

## 2017-10-30 LAB — LIPID PANEL
CHOL/HDL RATIO: 2.6 ratio (ref 0.0–5.0)
CHOLESTEROL TOTAL: 126 mg/dL (ref 100–199)
HDL: 48 mg/dL (ref >39)
LDL CALC: 70 mg/dL (ref 0–99)
TRIGLYCERIDES: 39 mg/dL (ref 0–149)
VLDL CHOLESTEROL CAL: 8 mg/dL (ref 5–40)

## 2017-10-30 LAB — HEMOGLOBIN A1C
Est. average glucose Bld gHb Est-mCnc: 123 mg/dL
Hgb A1c MFr Bld: 5.9 % — ABNORMAL HIGH (ref 4.8–5.6)

## 2017-10-30 LAB — TSH: TSH: 0.793 u[IU]/mL (ref 0.450–4.500)

## 2017-11-22 ENCOUNTER — Encounter: Payer: Self-pay | Admitting: Physician Assistant

## 2017-12-01 ENCOUNTER — Encounter: Payer: Self-pay | Admitting: Family Medicine

## 2017-12-01 ENCOUNTER — Ambulatory Visit (INDEPENDENT_AMBULATORY_CARE_PROVIDER_SITE_OTHER): Payer: 59

## 2017-12-01 ENCOUNTER — Telehealth: Payer: Self-pay | Admitting: Family Medicine

## 2017-12-01 ENCOUNTER — Ambulatory Visit: Payer: 59 | Admitting: Family Medicine

## 2017-12-01 ENCOUNTER — Other Ambulatory Visit: Payer: Self-pay

## 2017-12-01 VITALS — BP 180/92 | HR 69 | Temp 98.5°F | Ht 71.0 in | Wt 202.2 lb

## 2017-12-01 DIAGNOSIS — R29898 Other symptoms and signs involving the musculoskeletal system: Secondary | ICD-10-CM

## 2017-12-01 DIAGNOSIS — M545 Low back pain, unspecified: Secondary | ICD-10-CM

## 2017-12-01 DIAGNOSIS — Z8546 Personal history of malignant neoplasm of prostate: Secondary | ICD-10-CM | POA: Diagnosis not present

## 2017-12-01 DIAGNOSIS — R350 Frequency of micturition: Secondary | ICD-10-CM

## 2017-12-01 DIAGNOSIS — Z636 Dependent relative needing care at home: Secondary | ICD-10-CM

## 2017-12-01 DIAGNOSIS — N3040 Irradiation cystitis without hematuria: Secondary | ICD-10-CM | POA: Diagnosis not present

## 2017-12-01 DIAGNOSIS — N393 Stress incontinence (female) (male): Secondary | ICD-10-CM

## 2017-12-01 LAB — POCT URINALYSIS DIP (MANUAL ENTRY)
BILIRUBIN UA: NEGATIVE
BILIRUBIN UA: NEGATIVE mg/dL
GLUCOSE UA: NEGATIVE mg/dL
Leukocytes, UA: NEGATIVE
Nitrite, UA: NEGATIVE
Protein Ur, POC: NEGATIVE mg/dL
SPEC GRAV UA: 1.025 (ref 1.010–1.025)
Urobilinogen, UA: 1 E.U./dL
pH, UA: 5.5 (ref 5.0–8.0)

## 2017-12-01 MED ORDER — METHOCARBAMOL 500 MG PO TABS: 500.0000 mg | ORAL_TABLET | Freq: Three times a day (TID) | ORAL | 0 refills | Status: DC | PRN

## 2017-12-01 NOTE — Telephone Encounter (Signed)
Patient instructed by provider to come back in two weeks for appointment, front desk staff at 102 not available at end of day to schedule.   Please call patient tomorrow at [phone] to schedule his follow up appointment.

## 2017-12-01 NOTE — Patient Instructions (Addendum)
Juan Chavez, Juan Chavez, [place] [address]  [phone]  [phone] (Fax)    IF you received an x-ray today, you will receive an invoice from [facility]. Please contact [facility] at [phone] with questions or concerns regarding your invoice.   IF you received labwork today, you will receive an invoice from [facility]. Please contact [facility] at [phone] with questions or concerns regarding your invoice.   Our billing staff will not be able to assist you with questions regarding bills from these companies.  You will be contacted with the lab results as soon as they are available. The fastest way to get your results is to activate your My Chart account. Instructions are located on the last page of this paperwork. If you have not heard from [place] regarding the results in 2 weeks, please contact this office.      Piriformis Syndrome Rehab Ask your health care provider which exercises are safe for you. Do exercises exactly as told by your health care provider and adjust them as directed. It is normal to feel mild stretching, pulling, tightness, or discomfort as you do these exercises, but you should stop right away if you feel sudden pain or your pain gets worse.Do not begin these exercises until told by your health care provider. Stretching and range of motion exercises These exercises warm up your muscles and joints and improve the movement and flexibility of your hip and pelvis. These exercises also help to relieve pain, numbness, and tingling. Exercise A: Hip rotators  1. Lie on your back on a firm surface. 2. Pull your left / right knee toward your same shoulder with your left / right hand until your knee is pointing toward the ceiling. Hold your left / right ankle with your other hand. 3. Keeping your knee steady, gently pull your left / right ankle toward your other shoulder until you feel a stretch  in your buttocks. 4. Hold this position for __________ seconds. Repeat __________ times. Complete this stretch __________ times a day. Exercise B: Hip extensors 1. Lie on your back on a firm surface. Both of your legs should be straight. 2. Pull your left / right knee to your chest. Hold your leg in this position by holding onto the back of your thigh or the front of your knee. 3. Hold this position for __________ seconds. 4. Slowly return to the starting position. Repeat __________ times. Complete this stretch __________ times a day. Strengthening exercises These exercises build strength and endurance in your hip and thigh muscles. Endurance is the ability to use your muscles for a long time, even after they get tired. Exercise C: Straight leg raises ( hip abductors) 1. Lie on your side with your left / right leg in the top position. Lie so your head, shoulder, knee, and hip line up. Bend your bottom knee to help you balance. 2. Lift your top leg up 4-6 inches (10-15 cm), keeping your toes pointed straight ahead. 3. Hold this position for __________ seconds. 4. Slowly lower your leg to the starting position. Let your muscles relax completely. Repeat __________ times. Complete this exercise__________ times a day. Exercise D: Hip abductors and rotators, quadruped  1. Get on your hands and knees on a firm, lightly padded surface. Your hands should be directly below your shoulders, and your knees should be directly below your hips. 2. Lift your left / right knee out to the side. Keep your knee  bent. Do not twist your body. 3. Hold this position for __________ seconds. 4. Slowly lower your leg. Repeat __________ times. Complete this exercise__________ times a day. Exercise E: Straight leg raises ( hip extensors) 1. Lie on your abdomen on a bed or a firm surface with a pillow under your hips. 2. Squeeze your buttock muscles and lift your left / right thigh off the bed. Do not let your back  arch. 3. Hold this position for __________ seconds. 4. Slowly return to the starting position. Let your muscles relax completely before doing another repetition. Repeat __________ times. Complete this exercise__________ times a day. This information is not intended to replace advice given to you by your health care provider. Make sure you discuss any questions you have with your health care provider. Document Released: 08/10/2005 Document Revised: 04/14/2016 Document Reviewed: 07/23/2015 Elsevier Interactive Patient Education  [employer].

## 2017-12-01 NOTE — Progress Notes (Signed)
Subjective:    Patient ID: Juan Chavez, male    DOB: Jul 06, 1951, 67 y.o.   MRN: [id number]  12/01/2017  Back Pain (closer to right side w/ tightness behind the back of the knees)    HPI This 67 y.o. male presents for evaluation of back pain R with radiation into legs posterior. Onset three weeks ago.   Right sided lower back pain; localized area.   Urinary frequency; nocturia x 2-3; baseline 0-1.  No hematuria.  No dysuria.  No fever/chills/sweats.  No nausea or vomiting. Laying down at night makes it worse. Does fine after Advil every morning.  Bending over has no effect. Better standing up is good.  Milder pain standing. Working just fine. No numbness/tingling/weakness. B legs feel tired.   Alprazolam 1/2 qod for caregiver stress of parents.  Admits to major stressors.    Immunization History  Administered Date(s) Administered  . Influenza Split 04/24/2012  . Influenza,inj,Quad PF,6+ Mos 05/29/2013, 07/02/2014, 07/07/2016, 04/27/2017  . Influenza-Unspecified 05/29/2013, 07/02/2014, 05/09/2015, 07/07/2016, 04/27/2017  . Pneumococcal Conjugate-13 09/02/2015  . Pneumococcal Polysaccharide-23 10/27/2016  . Td 04/28/2011  . Tdap 08/25/2007  . Zoster Recombinat (Shingrix) 01/21/2017    Review of Systems  Constitutional: Negative for activity change, appetite change, chills, diaphoresis, fatigue and fever.  Respiratory: Negative for cough and shortness of breath.   Cardiovascular: Negative for chest pain, palpitations and leg swelling.  Gastrointestinal: Negative for abdominal distention, abdominal pain, anal bleeding, blood in stool, constipation, diarrhea, nausea, rectal pain and vomiting.  Endocrine: Negative for cold intolerance, heat intolerance, polydipsia, polyphagia and polyuria.  Genitourinary: Positive for urgency. Negative for difficulty urinating, discharge, dysuria, flank pain, frequency, genital sores, hematuria, penile pain, penile swelling, scrotal swelling  and testicular pain.  Musculoskeletal: Positive for back pain.  Skin: Negative for color change, rash and wound.  Neurological: Negative for dizziness, tremors, seizures, syncope, facial asymmetry, speech difficulty, weakness, light-headedness, numbness and headaches.  Psychiatric/Behavioral: Positive for sleep disturbance. Negative for dysphoric mood, self-injury and suicidal ideas. The patient is nervous/anxious.     Past Medical History:  Diagnosis Date  . Allergic rhinitis, cause unspecified   . Allergy   . Chicken pox    childhood  . Essential hypertension, benign   . Hematuria, unspecified   . Impotence of organic origin   . Measles    childhood  . Mumps    childhood  . Nonspecific abnormal electrocardiogram (ECG) (EKG)   . Obstructive sleep apnea (adult) (pediatric)    CPAP  . Other abnormal glucose   . Other B-complex deficiencies   . Personal history of malignant neoplasm of prostate   . Personal history of urinary calculi   . Prostate cancer [facility]) 2005   Prostatectomy  . Pure hypercholesterolemia   . Unspecified urinary incontinence   . Unspecified vitamin D deficiency    Past Surgical History:  Procedure Laterality Date  . COLONOSCOPY  01/22/2013   normal.  [facility].  Repeat 5 years.  . ELBOW SURGERY    . PROSTATECTOMY  2005  . urological surgery  07/2009   No Known Allergies Current Outpatient Medications on File Prior to Visit  Medication Sig Dispense Refill  . ALPRAZolam (XANAX) 0.5 MG tablet TAKE 1 TABLET BY MOUTH EVERY DAY AS NEEDED FOR ANXIETY 30 tablet 3  . aspirin 81 MG tablet Take 81 mg by mouth daily.    . cholecalciferol (VITAMIN D) 1000 UNITS tablet Take 1,000 Units by mouth 2 (two) times daily.    Marland Kitchen  cloNIDine (CATAPRES) 0.1 MG tablet Take 1 tablet (0.1 mg total) by mouth 2 (two) times daily. 180 tablet 3  . Cyanocobalamin (VITAMIN B-12 PO) Take by mouth daily.    Marland Kitchen diltiazem (CARDIZEM CD) 180 MG 24 hr capsule Take 1 capsule (180 mg total) by mouth 2  (two) times daily. 180 capsule 3  . fluticasone (FLONASE) 50 MCG/ACT nasal spray PLACE 2 SPRAYS INTO BOTH NOSTRILS DAILY. 16 g 11  . loratadine (CLARITIN) 10 MG tablet TAKE 1 TABLET (10 MG TOTAL) BY MOUTH DAILY. 90 tablet 3  . mirabegron ER (MYRBETRIQ) 50 MG TB24 tablet Take 1 tablet (50 mg total) by mouth daily. For urinary incontinence 30 tablet 1  . tadalafil (CIALIS) 20 MG tablet TAKE 1 TABLET (20 MG TOTAL) BY MOUTH DAILY AS NEEDED FOR ERECTILE DYSFUNCTION. 8 tablet 11  . Zoster Vaccine Adjuvanted (SHINGRIX) injection INJECT INTO THE MUSCLE ONCE.     No current facility-administered medications on file prior to visit.    Social History   Socioeconomic History  . Marital status: Married    Spouse name: Not on file  . Number of children: 2  . Years of education: Not on file  . Highest education level: Not on file  Occupational History  . Occupation: [employer]  Social Needs  . Financial resource strain: Not on file  . Food insecurity:    Worry: Not on file    Inability: Not on file  . Transportation needs:    Medical: Not on file    Non-medical: Not on file  Tobacco Use  . Smoking status: Never Smoker  . Smokeless tobacco: Never Used  Substance and Sexual Activity  . Alcohol use: Yes    Alcohol/week: 0.0 oz    Comment: occasional drinks beer 1 per week  . Drug use: No  . Sexual activity: Yes    Partners: Female  Lifestyle  . Physical activity:    Days per week: Not on file    Minutes per session: Not on file  . Stress: Not on file  Relationships  . Social connections:    Talks on phone: Not on file    Gets together: Not on file    Attends religious service: Not on file    Active member of club or organization: Not on file    Attends meetings of clubs or organizations: Not on file    Relationship status: Not on file  . Intimate partner violence:    Fear of current or ex partner: Not on file    Emotionally abused: Not on file    Physically  abused: Not on file    Forced sexual activity: Not on file  Other Topics Concern  . Not on file  Social History Narrative   Marital status:  Married x 55 years happily married.      Children:  2 children; 4 grandchildren.      Lives: with wife.  Children local.      Employment: [employer] x 46 years.  Plans to retire unknown.       Tobacco:  Never.      Alcohol: rare wine.      Drugs: none      Exercise: moderate, walking and running daily for 15 minutes.      Seatbelt:  Always uses seat belts.       Smoke alarm and carbon monoxide detector in the home.      Caffeine use: none.  Guns:  1 gun loaded; others unloaded.         Family History  Problem Relation Age of Onset  . Heart disease Father 72       AMI age 55; CM.  Marland Kitchen Hypertension Father   . Mental illness Sister        home in [place]  . Alcohol abuse Sister   . Hypertension Mother   . Heart disease Mother        AMI years ago; no CABG/stents.  . Arthritis Mother   . Dementia Mother   . Heart disease Brother 65       AMI; CM       Objective:    BP (!) 180/92 (BP Location: Left Arm, Patient Position: Sitting, Cuff Size: Normal)   Pulse 69   Temp 98.5 F (36.9 C) (Oral)   Ht 5\' 11"  (1.803 m)   Wt 202 lb 3.2 oz (91.7 kg)   SpO2 98%   BMI 28.20 kg/m  Physical Exam  Constitutional: He is oriented to person, place, and time. He appears well-developed and well-nourished. No distress.  HENT:  Head: Normocephalic and atraumatic.  Eyes: Pupils are equal, round, and reactive to light. Conjunctivae and EOM are normal.  Neck: Normal range of motion. Neck supple. Carotid bruit is not present. No thyromegaly present.  Cardiovascular: Normal rate, regular rhythm, normal heart sounds and intact distal pulses. Exam reveals no gallop and no friction rub.  No murmur heard. Pulmonary/Chest: Effort normal and breath sounds normal. He has no wheezes. He has no rales.  Abdominal: Soft. Bowel sounds are normal. He exhibits no  distension and no mass. There is no tenderness. There is no rebound and no guarding. Hernia confirmed negative in the right inguinal area and confirmed negative in the left inguinal area.  Genitourinary: Testes normal and penis normal. Right testis shows no mass, no swelling and no tenderness. Left testis shows no mass, no swelling and no tenderness.  Musculoskeletal:       Lumbar back: He exhibits pain and spasm. He exhibits normal range of motion, no tenderness and no bony tenderness.  Lymphadenopathy:    He has no cervical adenopathy.       Right: No inguinal adenopathy present.       Left: No inguinal adenopathy present.  Neurological: He is alert and oriented to person, place, and time. No cranial nerve deficit.  Skin: Skin is warm and dry. No rash noted. He is not diaphoretic.  Psychiatric: He has a normal mood and affect. His behavior is normal.  Nursing note and vitals reviewed.  No results found.        Assessment & Plan:   1. Acute right-sided low back pain without sciatica   2. Leg heaviness   3. Urinary frequency   4. Caregiver stress   5. Genuine stress incontinence, male   6. H/O malignant neoplasm of prostate   7. Cystitis, radiation     New onset right-sided lower back pain without sciatica: Associated with leg heaviness and urinary frequency.  History of prostate cancer.  Symptoms and exam most consistent with musculoskeletal strain.  Obtain urine culture to rule out urinary tract infection.  Obtain labs to rule out secondary causes.  Treat with methocarbamol therapy.  Home exercise program recommended.  Obtain lumbar spine films.  If no improvement in 2 to 3 weeks, call for orthopedic referral.  Orders Placed This Encounter  Procedures  . Urine Culture    Order Specific  Question:   Source    Answer:   clean catch  . DG Lumbar Spine Complete    Standing Status:   Future    Number of Occurrences:   1    Standing Expiration Date:   12/01/2018    Order Specific  Question:   Reason for Exam (SYMPTOM  OR DIAGNOSIS REQUIRED)    Answer:   R sided lower back pain    Order Specific Question:   Preferred imaging location?    Answer:   External  . CBC with Differential/Platelet  . Comprehensive metabolic panel  . POCT urinalysis dipstick   Meds ordered this encounter  Medications  . DISCONTD: methocarbamol (ROBAXIN) 500 MG tablet    Sig: Take 1-2 tablets (500-1,000 mg total) by mouth every 8 (eight) hours as needed for muscle spasms.    Dispense:  40 tablet    Refill:  0    Return in about 3 weeks (around 12/22/2017) for recheck.   Stella Encarnacion Elayne Guerin, M.D. Primary Care at [facility] [address] [place], [place]  [address] [phone] phone [phone] fax

## 2017-12-02 LAB — CBC WITH DIFFERENTIAL/PLATELET
BASOS: 0 %
Basophils Absolute: 0 10*3/uL (ref 0.0–0.2)
EOS (ABSOLUTE): 0.1 10*3/uL (ref 0.0–0.4)
EOS: 2 %
HEMATOCRIT: 38.2 % (ref 37.5–51.0)
HEMOGLOBIN: 13.3 g/dL (ref 13.0–17.7)
Immature Grans (Abs): 0 10*3/uL (ref 0.0–0.1)
Immature Granulocytes: 0 %
LYMPHS ABS: 1.8 10*3/uL (ref 0.7–3.1)
Lymphs: 27 %
MCH: 29.4 pg (ref 26.6–33.0)
MCHC: 34.8 g/dL (ref 31.5–35.7)
MCV: 85 fL (ref 79–97)
MONOCYTES: 4 %
MONOS ABS: 0.3 10*3/uL (ref 0.1–0.9)
Neutrophils Absolute: 4.5 10*3/uL (ref 1.4–7.0)
Neutrophils: 67 %
Platelets: 166 10*3/uL (ref 150–379)
RBC: 4.52 x10E6/uL (ref 4.14–5.80)
RDW: 13.9 % (ref 12.3–15.4)
WBC: 6.7 10*3/uL (ref 3.4–10.8)

## 2017-12-02 LAB — COMPREHENSIVE METABOLIC PANEL
A/G RATIO: 1.6 (ref 1.2–2.2)
ALT: 19 IU/L (ref 0–44)
AST: 15 IU/L (ref 0–40)
Albumin: 4.4 g/dL (ref 3.6–4.8)
Alkaline Phosphatase: 84 IU/L (ref 39–117)
BILIRUBIN TOTAL: 0.3 mg/dL (ref 0.0–1.2)
BUN/Creatinine Ratio: 22 (ref 10–24)
BUN: 19 mg/dL (ref 8–27)
CHLORIDE: 107 mmol/L — AB (ref 96–106)
CO2: 24 mmol/L (ref 20–29)
Calcium: 9.5 mg/dL (ref 8.6–10.2)
Creatinine, Ser: 0.88 mg/dL (ref 0.76–1.27)
GFR calc Af Amer: 103 mL/min/{1.73_m2} (ref >59)
GFR calc non Af Amer: 89 mL/min/{1.73_m2} (ref >59)
GLOBULIN, TOTAL: 2.7 g/dL (ref 1.5–4.5)
Glucose: 100 mg/dL — ABNORMAL HIGH (ref 65–99)
POTASSIUM: 4 mmol/L (ref 3.5–5.2)
SODIUM: 146 mmol/L — AB (ref 134–144)
Total Protein: 7.1 g/dL (ref 6.0–8.5)

## 2017-12-02 NOTE — Telephone Encounter (Signed)
Called pt - left VM per DPR - advised him to call office to make a 2 week F/U appt per Dr. Tamala Julian.  When pt calls back, please schedule him a 2 week F/U with Dr. Tamala Julian.  Thanks!

## 2017-12-03 ENCOUNTER — Other Ambulatory Visit: Payer: Self-pay | Admitting: Family Medicine

## 2017-12-03 LAB — URINE CULTURE

## 2017-12-07 LAB — URINE CULTURE

## 2017-12-08 ENCOUNTER — Other Ambulatory Visit: Payer: Self-pay | Admitting: Family Medicine

## 2017-12-08 ENCOUNTER — Encounter: Payer: Self-pay | Admitting: Family Medicine

## 2017-12-08 MED ORDER — CIPROFLOXACIN HCL 500 MG PO TABS: 500.0000 mg | ORAL_TABLET | Freq: Two times a day (BID) | ORAL | 14 refills | Status: DC

## 2017-12-15 ENCOUNTER — Ambulatory Visit: Payer: 59 | Admitting: Family Medicine

## 2017-12-15 ENCOUNTER — Encounter: Payer: Self-pay | Admitting: Family Medicine

## 2017-12-15 ENCOUNTER — Other Ambulatory Visit: Payer: Self-pay

## 2017-12-15 VITALS — BP 150/80 | HR 60 | Temp 98.0°F | Resp 16 | Wt 203.0 lb

## 2017-12-15 DIAGNOSIS — Z8546 Personal history of malignant neoplasm of prostate: Secondary | ICD-10-CM | POA: Diagnosis not present

## 2017-12-15 DIAGNOSIS — M25541 Pain in joints of right hand: Secondary | ICD-10-CM

## 2017-12-15 DIAGNOSIS — N3000 Acute cystitis without hematuria: Secondary | ICD-10-CM

## 2017-12-15 DIAGNOSIS — M5136 Other intervertebral disc degeneration, lumbar region: Secondary | ICD-10-CM

## 2017-12-15 DIAGNOSIS — M25542 Pain in joints of left hand: Secondary | ICD-10-CM | POA: Diagnosis not present

## 2017-12-15 MED ORDER — CIPROFLOXACIN HCL 500 MG PO TABS: 500.0000 mg | ORAL_TABLET | Freq: Two times a day (BID) | ORAL | 0 refills | Status: DC

## 2017-12-15 NOTE — Patient Instructions (Addendum)
Sciatica Rehab Ask your health care provider which exercises are safe for you. Do exercises exactly as told by your health care provider and adjust them as directed. It is normal to feel mild stretching, pulling, tightness, or discomfort as you do these exercises, but you should stop right away if you feel sudden pain or your pain gets worse.Do not begin these exercises until told by your health care provider. Stretching and range of motion exercises These exercises warm up your muscles and joints and improve the movement and flexibility of your hips and your back. These exercises also help to relieve pain, numbness, and tingling. Exercise A: Sciatic nerve glide 1. Sit in a chair with your head facing down toward your chest. Place your hands behind your back. Let your shoulders slump forward. 2. Slowly straighten one of your knees while you tilt your head back as if you are looking toward the ceiling. Only straighten your leg as far as you can without making your symptoms worse. 3. Hold for __________ seconds. 4. Slowly return to the starting position. 5. Repeat with your other leg. Repeat __________ times. Complete this exercise __________ times a day. Exercise B: Knee to chest with hip adduction and internal rotation  1. Lie on your back on a firm surface with both legs straight. 2. Bend one of your knees and move it up toward your chest until you feel a gentle stretch in your lower back and buttock. Then, move your knee toward the shoulder that is on the opposite side from your leg. ? Hold your leg in this position by holding onto the front of your knee. 3. Hold for __________ seconds. 4. Slowly return to the starting position. 5. Repeat with your other leg. Repeat __________ times. Complete this exercise __________ times a day. Exercise C: Prone extension on elbows  1. Lie on your abdomen on a firm surface. A bed may be too soft for this exercise. 2. Prop yourself up on your  elbows. 3. Use your arms to help lift your chest up until you feel a gentle stretch in your abdomen and your lower back. ? This will place some of your body weight on your elbows. If this is uncomfortable, try stacking pillows under your chest. ? Your hips should stay down, against the surface that you are lying on. Keep your hip and back muscles relaxed. 4. Hold for __________ seconds. 5. Slowly relax your upper body and return to the starting position. Repeat __________ times. Complete this exercise __________ times a day. Strengthening exercises These exercises build strength and endurance in your back. Endurance is the ability to use your muscles for a long time, even after they get tired. Exercise D: Pelvic tilt 1. Lie on your back on a firm surface. Bend your knees and keep your feet flat. 2. Tense your abdominal muscles. Tip your pelvis up toward the ceiling and flatten your lower back into the floor. ? To help with this exercise, you may place a small towel under your lower back and try to push your back into the towel. 3. Hold for __________ seconds. 4. Let your muscles relax completely before you repeat this exercise. Repeat __________ times. Complete this exercise __________ times a day. Exercise E: Alternating arm and leg raises  1. Get on your hands and knees on a firm surface. If you are on a hard floor, you may want to use padding to cushion your knees, such as an exercise mat. 2. Line up  your arms and legs. Your hands should be below your shoulders, and your knees should be below your hips. 3. Lift your left leg behind you. At the same time, raise your right arm and straighten it in front of you. ? Do not lift your leg higher than your hip. ? Do not lift your arm higher than your shoulder. ? Keep your abdominal and back muscles tight. ? Keep your hips facing the ground. ? Do not arch your back. ? Keep your balance carefully, and do not hold your breath. 4. Hold for  __________ seconds. 5. Slowly return to the starting position and repeat with your right leg and your left arm. Repeat __________ times. Complete this exercise __________ times a day. Posture and body mechanics  Body mechanics refers to the movements and positions of your body while you do your daily activities. Posture is part of body mechanics. Good posture and healthy body mechanics can help to relieve stress in your body's tissues and joints. Good posture means that your spine is in its natural S-curve position (your spine is neutral), your shoulders are pulled back slightly, and your head is not tipped forward. The following are general guidelines for applying improved posture and body mechanics to your everyday activities. Standing   When standing, keep your spine neutral and your feet about hip-width apart. Keep a slight bend in your knees. Your ears, shoulders, and hips should line up.  When you do a task in which you stand in one place for a long time, place one foot up on a stable object that is 2-4 inches (5-10 cm) high, such as a footstool. This helps keep your spine neutral. Sitting   When sitting, keep your spine neutral and keep your feet flat on the floor. Use a footrest, if necessary, and keep your thighs parallel to the floor. Avoid rounding your shoulders, and avoid tilting your head forward.  When working at a desk or a computer, keep your desk at a height where your hands are slightly lower than your elbows. Slide your chair under your desk so you are close enough to maintain good posture.  When working at a computer, place your monitor at a height where you are looking straight ahead and you do not have to tilt your head forward or downward to look at the screen. Resting   When lying down and resting, avoid positions that are most painful for you.  If you have pain with activities such as sitting, bending, stooping, or squatting (flexion-based activities), lie in a  position in which your body does not bend very much. For example, avoid curling up on your side with your arms and knees near your chest (fetal position).  If you have pain with activities such as standing for a long time or reaching with your arms (extension-based activities), lie with your spine in a neutral position and bend your knees slightly. Try the following positions: ? Lying on your side with a pillow between your knees. ? Lying on your back with a pillow under your knees. Lifting   When lifting objects, keep your feet at least shoulder-width apart and tighten your abdominal muscles.  Bend your knees and hips and keep your spine neutral. It is important to lift using the strength of your legs, not your back. Do not lock your knees straight out.  Always ask for help to lift heavy or awkward objects. This information is not intended to replace advice given to you by your  health care provider. Make sure you discuss any questions you have with your health care provider. Document Released: 08/10/2005 Document Revised: 04/16/2016 Document Reviewed: 04/26/2015 Elsevier Interactive Patient Education  2018 [employer].   IF you received an x-ray today, you will receive an invoice from [facility]. Please contact [facility] at [phone] with questions or concerns regarding your invoice.   IF you received labwork today, you will receive an invoice from [facility]. Please contact [facility] at [phone] with questions or concerns regarding your invoice.   Our billing staff will not be able to assist you with questions regarding bills from these companies.  You will be contacted with the lab results as soon as they are available. The fastest way to get your results is to activate your My Chart account. Instructions are located on the last page of this paperwork. If you have not heard from [place] regarding the results in 2 weeks, please contact this office.

## 2017-12-15 NOTE — Progress Notes (Signed)
Subjective:    Patient ID: Juan Chavez, male    DOB: 1950/12/27, 67 y.o.   MRN: [id number]  12/15/2017  Acute right sided low back pain    HPI This 67 y.o. male presents for evaluation of LOW BACK PAIN AND URINARY TRACT INFECTION AND HYPERTENSION.  L shoulder pain scapular pain is improved; much better.  R flank pain is still there.  Still taking cipro; has 6-8 more.  Urinating frequency; drinking plenty of water.   No hematuria. No fever.   No nausea or vomiting. Chronic persistent pain.   Hands were sore.  No tingling. No swelling.   20-50% improved. Hunting every morning at 5:00am.   BP Readings from Last 3 Encounters:  02/15/18 (!) 148/78  02/02/18 140/82  12/15/17 (!) 150/80   Wt Readings from Last 3 Encounters:  02/15/18 200 lb 12.8 oz (91.1 kg)  02/02/18 203 lb (92.1 kg)  12/15/17 203 lb (92.1 kg)   Immunization History  Administered Date(s) Administered  . Influenza Split 04/24/2012  . Influenza,inj,Quad PF,6+ Mos 05/29/2013, 07/02/2014, 07/07/2016, 04/27/2017  . Influenza-Unspecified 05/29/2013, 07/02/2014, 05/09/2015, 07/07/2016, 04/27/2017  . Pneumococcal Conjugate-13 09/02/2015  . Pneumococcal Polysaccharide-23 10/27/2016  . Td 04/28/2011  . Tdap 08/25/2007  . Zoster Recombinat (Shingrix) 01/21/2017    Review of Systems  Constitutional: Negative for activity change, appetite change, chills, diaphoresis, fatigue and fever.  Respiratory: Negative for cough and shortness of breath.   Cardiovascular: Negative for chest pain, palpitations and leg swelling.  Gastrointestinal: Negative for abdominal pain, diarrhea, nausea and vomiting.  Endocrine: Negative for cold intolerance, heat intolerance, polydipsia, polyphagia and polyuria.  Genitourinary: Positive for frequency and urgency. Negative for discharge, dysuria, flank pain, genital sores, hematuria, penile pain, penile swelling, scrotal swelling and testicular pain.  Musculoskeletal: Positive for  arthralgias, back pain, joint swelling and myalgias. Negative for neck pain and neck stiffness.  Skin: Negative for color change, rash and wound.  Neurological: Negative for dizziness, tremors, seizures, syncope, facial asymmetry, speech difficulty, weakness, light-headedness, numbness and headaches.  Psychiatric/Behavioral: Negative for dysphoric mood and sleep disturbance. The patient is not nervous/anxious.     Past Medical History:  Diagnosis Date  . Allergic rhinitis, cause unspecified   . Allergy   . Chicken pox    childhood  . Essential hypertension, benign   . Hematuria, unspecified   . Impotence of organic origin   . Measles    childhood  . Mumps    childhood  . Nonspecific abnormal electrocardiogram (ECG) (EKG)   . Obstructive sleep apnea (adult) (pediatric)    CPAP  . Other abnormal glucose   . Other B-complex deficiencies   . Personal history of malignant neoplasm of prostate   . Personal history of urinary calculi   . Prostate cancer [facility]) 2005   Prostatectomy  . Pure hypercholesterolemia   . Unspecified urinary incontinence   . Unspecified vitamin D deficiency    Past Surgical History:  Procedure Laterality Date  . COLONOSCOPY  01/22/2013   normal.  [facility].  Repeat 5 years.  . ELBOW SURGERY    . PROSTATECTOMY  2005  . urological surgery  07/2009   No Known Allergies Current Outpatient Medications on File Prior to Visit  Medication Sig Dispense Refill  . ALPRAZolam (XANAX) 0.5 MG tablet TAKE 1 TABLET BY MOUTH EVERY DAY AS NEEDED FOR ANXIETY 30 tablet 3  . aspirin 81 MG tablet Take 81 mg by mouth daily.    . cholecalciferol (VITAMIN D) 1000 UNITS  tablet Take 1,000 Units by mouth 2 (two) times daily.    . cloNIDine (CATAPRES) 0.1 MG tablet Take 1 tablet (0.1 mg total) by mouth 2 (two) times daily. 180 tablet 3  . Cyanocobalamin (VITAMIN B-12 PO) Take by mouth daily.    Marland Kitchen diltiazem (CARDIZEM CD) 180 MG 24 hr capsule Take 1 capsule (180 mg total) by mouth 2 (two)  times daily. 180 capsule 3  . fluticasone (FLONASE) 50 MCG/ACT nasal spray PLACE 2 SPRAYS INTO BOTH NOSTRILS DAILY. 16 g 11  . loratadine (CLARITIN) 10 MG tablet TAKE 1 TABLET (10 MG TOTAL) BY MOUTH DAILY. 90 tablet 3  . mirabegron ER (MYRBETRIQ) 50 MG TB24 tablet Take 1 tablet (50 mg total) by mouth daily. For urinary incontinence 30 tablet 1  . tadalafil (CIALIS) 20 MG tablet TAKE 1 TABLET (20 MG TOTAL) BY MOUTH DAILY AS NEEDED FOR ERECTILE DYSFUNCTION. 8 tablet 11  . Zoster Vaccine Adjuvanted (SHINGRIX) injection INJECT INTO THE MUSCLE ONCE.     No current facility-administered medications on file prior to visit.    Social History   Socioeconomic History  . Marital status: Married    Spouse name: Not on file  . Number of children: 2  . Years of education: Not on file  . Highest education level: Not on file  Occupational History  . Occupation: [employer]  Social Needs  . Financial resource strain: Not on file  . Food insecurity:    Worry: Not on file    Inability: Not on file  . Transportation needs:    Medical: Not on file    Non-medical: Not on file  Tobacco Use  . Smoking status: Never Smoker  . Smokeless tobacco: Never Used  Substance and Sexual Activity  . Alcohol use: Yes    Alcohol/week: 0.0 oz    Comment: occasional drinks beer 1 per week  . Drug use: No  . Sexual activity: Yes    Partners: Female  Lifestyle  . Physical activity:    Days per week: Not on file    Minutes per session: Not on file  . Stress: Not on file  Relationships  . Social connections:    Talks on phone: Not on file    Gets together: Not on file    Attends religious service: Not on file    Active member of club or organization: Not on file    Attends meetings of clubs or organizations: Not on file    Relationship status: Not on file  . Intimate partner violence:    Fear of current or ex partner: Not on file    Emotionally abused: Not on file    Physically abused:  Not on file    Forced sexual activity: Not on file  Other Topics Concern  . Not on file  Social History Narrative   Marital status:  Married x 48 years happily married.      Children:  2 children; 4 grandchildren.      Lives: with wife.  Children local.      Employment: [employer] x 46 years.  Plans to retire unknown.       Tobacco:  Never.      Alcohol: rare wine.      Drugs: none      Exercise: moderate, walking and running daily for 15 minutes.      Seatbelt:  Always uses seat belts.       Smoke alarm and carbon monoxide detector in  the home.      Caffeine use: none.       Guns:  1 gun loaded; others unloaded.         Family History  Problem Relation Age of Onset  . Heart disease Father 75       AMI age 66; CM.  Marland Kitchen Hypertension Father   . Mental illness Sister        home in [place]  . Alcohol abuse Sister   . Hypertension Mother   . Heart disease Mother        AMI years ago; no CABG/stents.  . Arthritis Mother   . Dementia Mother   . Heart disease Brother 41       AMI; CM       Objective:    BP (!) 150/80   Pulse 60   Temp 98 F (36.7 C) (Oral)   Resp 16   Wt 203 lb (92.1 kg)   SpO2 99%   BMI 28.31 kg/m  Physical Exam  Constitutional: He is oriented to person, place, and time. He appears well-developed and well-nourished. No distress.  HENT:  Head: Normocephalic and atraumatic.  Right Ear: External ear normal.  Left Ear: External ear normal.  Nose: Nose normal.  Mouth/Throat: Oropharynx is clear and moist.  Eyes: Pupils are equal, round, and reactive to light. Conjunctivae and EOM are normal.  Neck: Normal range of motion. Neck supple. Carotid bruit is not present. No thyromegaly present.  Cardiovascular: Normal rate, regular rhythm, normal heart sounds and intact distal pulses. Exam reveals no gallop and no friction rub.  No murmur heard. Pulmonary/Chest: Effort normal and breath sounds normal. He has no wheezes. He has no rales.  Abdominal: Soft. Bowel  sounds are normal. He exhibits no distension and no mass. There is no tenderness. There is no rebound and no guarding.  Musculoskeletal:       Lumbar back: He exhibits pain. He exhibits normal range of motion, no tenderness, no bony tenderness, no spasm and normal pulse.       Right hand: Normal. He exhibits normal range of motion and no tenderness. Normal sensation noted. Normal strength noted.       Left hand: He exhibits normal range of motion, no tenderness and no bony tenderness. Normal sensation noted. Normal strength noted.  Lymphadenopathy:    He has no cervical adenopathy.  Neurological: He is alert and oriented to person, place, and time. No cranial nerve deficit.  Skin: Skin is warm and dry. No rash noted. He is not diaphoretic.  Psychiatric: He has a normal mood and affect. His behavior is normal.  Nursing note and vitals reviewed.  No results found.  Fall Risk  02/15/2018 02/02/2018 12/15/2017 12/01/2017 10/29/2017  Falls in the past year? No No No No No         Assessment & Plan:   1. Acute cystitis without hematuria   2. Arthralgia of both hands   3. Degenerative disc disease, lumbar     Acute cystitis: slowly improving; repeat urine cx today; suffering with ongoing urinary frequency.  Arthralgias of B hands: New onset; with intermittent swelling which has resolved today; also recent lower back pain; multiple joints hurting; obtain sed rate, ANA, RF, Ck; obtain CBC, CMET, TSH.    DDD lumbar spine with recent lower back pain: also with recent acute cystitis; recommend home exercise program and supportive care with rest.  Patient hunting every morning before work; thus, overuse of lumbar spine may  be triggering lower back pain.    Orders Placed This Encounter  Procedures  . Urine Culture    Order Specific Question:   Source    Answer:   clean catch  . Urine Culture  . Urinalysis, dipstick only  . Urine Microscopic  . CBC with Differential/Platelet  . CK  . TSH  .  Sedimentation rate  . ANA,IFA RA Diag Pnl w/rflx Tit/Patn  . Rheumatoid Arthritis Profile  . Urinalysis, dipstick only   Meds ordered this encounter  Medications  . DISCONTD: ciprofloxacin (CIPRO) 500 MG tablet    Sig: Take 1 tablet (500 mg total) by mouth 2 (two) times daily.    Dispense:  20 tablet    Refill:  0    No follow-ups on file.   Carime Dinkel Elayne Guerin, M.D. Primary Care at [facility] previously [facility] [address] [place], [place]  [address] [phone] phone [phone] fax

## 2017-12-16 LAB — CBC WITH DIFFERENTIAL/PLATELET
Basophils Absolute: 0 10*3/uL (ref 0.0–0.2)
Basos: 1 %
EOS (ABSOLUTE): 0.1 10*3/uL (ref 0.0–0.4)
Eos: 2 %
HEMOGLOBIN: 14 g/dL (ref 13.0–17.7)
Hematocrit: 42.1 % (ref 37.5–51.0)
IMMATURE GRANS (ABS): 0 10*3/uL (ref 0.0–0.1)
IMMATURE GRANULOCYTES: 0 %
LYMPHS: 23 %
Lymphocytes Absolute: 1.5 10*3/uL (ref 0.7–3.1)
MCH: 29.7 pg (ref 26.6–33.0)
MCHC: 33.3 g/dL (ref 31.5–35.7)
MCV: 89 fL (ref 79–97)
MONOCYTES: 6 %
Monocytes Absolute: 0.4 10*3/uL (ref 0.1–0.9)
NEUTROS ABS: 4.4 10*3/uL (ref 1.4–7.0)
NEUTROS PCT: 68 %
PLATELETS: 168 10*3/uL (ref 150–379)
RBC: 4.72 x10E6/uL (ref 4.14–5.80)
RDW: 13.7 % (ref 12.3–15.4)
WBC: 6.4 10*3/uL (ref 3.4–10.8)

## 2017-12-16 LAB — ANA,IFA RA DIAG PNL W/RFLX TIT/PATN
ANA Titer 1: NEGATIVE
Cyclic Citrullin Peptide Ab: 5 units (ref 0–19)
Rhuematoid fact SerPl-aCnc: <10 IU/mL (ref 0.0–13.9)

## 2017-12-16 LAB — CK: CK TOTAL: 191 U/L (ref 24–204)

## 2017-12-16 LAB — SEDIMENTATION RATE: SED RATE: 5 mm/h (ref 0–30)

## 2017-12-16 LAB — TSH: TSH: 0.863 u[IU]/mL (ref 0.450–4.500)

## 2017-12-17 LAB — URINALYSIS, DIPSTICK ONLY
BILIRUBIN UA: NEGATIVE
Glucose, UA: NEGATIVE
KETONES UA: NEGATIVE
LEUKOCYTES UA: NEGATIVE
NITRITE UA: NEGATIVE
RBC UA: NEGATIVE
UUROB: 1 mg/dL (ref 0.2–1.0)
pH, UA: 5 (ref 5.0–7.5)

## 2017-12-17 LAB — URINALYSIS, MICROSCOPIC ONLY: Casts: NONE SEEN /lpf

## 2017-12-17 LAB — URINE CULTURE: ORGANISM ID, BACTERIA: NO GROWTH

## 2018-01-18 ENCOUNTER — Encounter: Payer: Self-pay | Admitting: Family Medicine

## 2018-02-02 ENCOUNTER — Encounter: Payer: Self-pay | Admitting: Family Medicine

## 2018-02-02 ENCOUNTER — Ambulatory Visit: Payer: 59 | Admitting: Family Medicine

## 2018-02-02 ENCOUNTER — Other Ambulatory Visit: Payer: Self-pay

## 2018-02-02 VITALS — BP 140/82 | HR 75 | Temp 98.0°F | Resp 16 | Ht 71.0 in | Wt 203.0 lb

## 2018-02-02 DIAGNOSIS — N393 Stress incontinence (female) (male): Secondary | ICD-10-CM | POA: Diagnosis not present

## 2018-02-02 DIAGNOSIS — M25542 Pain in joints of left hand: Secondary | ICD-10-CM

## 2018-02-02 DIAGNOSIS — M25541 Pain in joints of right hand: Secondary | ICD-10-CM

## 2018-02-02 DIAGNOSIS — M51369 Other intervertebral disc degeneration, lumbar region without mention of lumbar back pain or lower extremity pain: Secondary | ICD-10-CM

## 2018-02-02 DIAGNOSIS — J301 Allergic rhinitis due to pollen: Secondary | ICD-10-CM | POA: Diagnosis not present

## 2018-02-02 DIAGNOSIS — Z8546 Personal history of malignant neoplasm of prostate: Secondary | ICD-10-CM

## 2018-02-02 DIAGNOSIS — M5136 Other intervertebral disc degeneration, lumbar region: Secondary | ICD-10-CM

## 2018-02-02 DIAGNOSIS — R29898 Other symptoms and signs involving the musculoskeletal system: Secondary | ICD-10-CM

## 2018-02-02 DIAGNOSIS — I10 Essential (primary) hypertension: Secondary | ICD-10-CM

## 2018-02-02 DIAGNOSIS — M545 Low back pain, unspecified: Secondary | ICD-10-CM

## 2018-02-02 LAB — CBC WITH DIFFERENTIAL/PLATELET
Basophils Absolute: 0 10*3/uL (ref 0.0–0.2)
Basos: 1 %
EOS (ABSOLUTE): 0.2 10*3/uL (ref 0.0–0.4)
Eos: 3 %
Hematocrit: 40.8 % (ref 37.5–51.0)
Hemoglobin: 14.2 g/dL (ref 13.0–17.7)
Immature Grans (Abs): 0 10*3/uL (ref 0.0–0.1)
Immature Granulocytes: 0 %
Lymphocytes Absolute: 1.2 10*3/uL (ref 0.7–3.1)
Lymphs: 21 %
MCH: 29.6 pg (ref 26.6–33.0)
MCHC: 34.8 g/dL (ref 31.5–35.7)
MCV: 85 fL (ref 79–97)
Monocytes Absolute: 0.6 10*3/uL (ref 0.1–0.9)
Monocytes: 10 %
Neutrophils Absolute: 3.7 10*3/uL (ref 1.4–7.0)
Neutrophils: 65 %
Platelets: 165 10*3/uL (ref 150–450)
RBC: 4.8 x10E6/uL (ref 4.14–5.80)
RDW: 14 % (ref 12.3–15.4)
WBC: 5.7 10*3/uL (ref 3.4–10.8)

## 2018-02-02 LAB — COMPREHENSIVE METABOLIC PANEL
ALT: 18 IU/L (ref 0–44)
AST: 15 IU/L (ref 0–40)
Albumin/Globulin Ratio: 1.4 (ref 1.2–2.2)
Albumin: 4.3 g/dL (ref 3.6–4.8)
Alkaline Phosphatase: 92 IU/L (ref 39–117)
BUN/Creatinine Ratio: 15 (ref 10–24)
BUN: 14 mg/dL (ref 8–27)
Bilirubin Total: 0.6 mg/dL (ref 0.0–1.2)
CO2: 26 mmol/L (ref 20–29)
Calcium: 9.2 mg/dL (ref 8.6–10.2)
Chloride: 103 mmol/L (ref 96–106)
Creatinine, Ser: 0.92 mg/dL (ref 0.76–1.27)
GFR calc Af Amer: 99 mL/min/{1.73_m2} (ref >59)
GFR calc non Af Amer: 86 mL/min/{1.73_m2} (ref >59)
Globulin, Total: 3.1 g/dL (ref 1.5–4.5)
Glucose: 108 mg/dL — ABNORMAL HIGH (ref 65–99)
Potassium: 3.8 mmol/L (ref 3.5–5.2)
Sodium: 142 mmol/L (ref 134–144)
Total Protein: 7.4 g/dL (ref 6.0–8.5)

## 2018-02-02 LAB — POCT URINALYSIS DIP (MANUAL ENTRY)
Bilirubin, UA: NEGATIVE
Glucose, UA: NEGATIVE mg/dL
Ketones, POC UA: NEGATIVE mg/dL
Leukocytes, UA: NEGATIVE
Nitrite, UA: NEGATIVE
Protein Ur, POC: NEGATIVE mg/dL
Spec Grav, UA: 1.015 (ref 1.010–1.025)
Urobilinogen, UA: 0.2 E.U./dL
pH, UA: 7 (ref 5.0–8.0)

## 2018-02-02 MED ORDER — OXYBUTYNIN CHLORIDE ER 5 MG PO TB24: 5.0000 mg | ORAL_TABLET | Freq: Every day | ORAL | 1 refills | Status: AC

## 2018-02-02 NOTE — Patient Instructions (Addendum)
   Call [place] [facility] TO SCHEDULE APPOINTMENT WITH ME IN September 2019.  IF you received an x-ray today, you will receive an invoice from [facility]. Please contact [facility] at [phone] with questions or concerns regarding your invoice.   IF you received labwork today, you will receive an invoice from [facility]. Please contact [facility] at [phone] with questions or concerns regarding your invoice.   Our billing staff will not be able to assist you with questions regarding bills from these companies.  You will be contacted with the lab results as soon as they are available. The fastest way to get your results is to activate your My Chart account. Instructions are located on the last page of this paperwork. If you have not heard from [place] regarding the results in 2 weeks, please contact this office.

## 2018-02-02 NOTE — Progress Notes (Signed)
Subjective:    Patient ID: Juan Chavez, male    DOB: July 03, 1951, 67 y.o.   MRN: [id number]  02/02/2018  Chronic Conditions (3 month follow-up)    HPI This 67 y.o. male presents six week follow-up for evaluation of hypertension. Blood pressure running good at home on Valsartan.   Urinating is better.  Denies dysuria, urgency, hesitancy.  Denies hematuria.  No flank pain.   Lower back pain is better.  No longer taking muscle relaxer; ran out.   Sinus congestion: onset two days ago; versus cold.  Rhinorrhea excessive.  Throat soreness but improved.  No fever/chills/sweats.  Mild headache.  No cough.  No SOB.  Taking Flonase.  Not taking Claritin; just ran out.  Ran out of Claritin more than one week ago.    Feeling tired a lot; not sure of etiology; excessive sleep; will fall asleep in a minute.  Stress with parents is better; not as stressful; adjustment.  Onset of fatigue is two months ago.  Morning time unable to get going.  Gets up just fine; no get up and go.  Pushes through and then fine; duration of fatigue for two hours.  Wife reports that patient is able to go hunting several mornings per week and then go to work despite fatigue.  Muscle fatigue in legs is resolving.  Hand pain is improving.   Immunization History  Administered Date(s) Administered  . Influenza Split 04/24/2012  . Influenza,inj,Quad PF,6+ Mos 05/29/2013, 07/02/2014, 07/07/2016, 04/27/2017  . Influenza-Unspecified 05/29/2013, 07/02/2014, 05/09/2015, 07/07/2016, 04/27/2017  . Pneumococcal Conjugate-13 09/02/2015  . Pneumococcal Polysaccharide-23 10/27/2016  . Td 04/28/2011  . Tdap 08/25/2007  . Zoster Recombinat (Shingrix) 01/21/2017    Review of Systems  Constitutional: Positive for fatigue. Negative for activity change, appetite change, chills, diaphoresis, fever and unexpected weight change.  HENT: Positive for congestion, postnasal drip, rhinorrhea, sneezing and sore throat. Negative for dental  problem, drooling, ear discharge, ear pain, facial swelling, hearing loss, mouth sores, nosebleeds, sinus pressure, tinnitus, trouble swallowing and voice change.   Eyes: Negative for photophobia, pain, discharge, redness, itching and visual disturbance.  Respiratory: Negative for apnea, cough, choking, chest tightness, shortness of breath, wheezing and stridor.   Cardiovascular: Negative for chest pain, palpitations and leg swelling.  Gastrointestinal: Negative for abdominal distention, abdominal pain, anal bleeding, blood in stool, constipation, diarrhea, nausea, rectal pain and vomiting.  Endocrine: Negative for cold intolerance, heat intolerance, polydipsia, polyphagia and polyuria.  Genitourinary: Negative for decreased urine volume, difficulty urinating, discharge, dysuria, enuresis, flank pain, frequency, genital sores, hematuria, penile pain, penile swelling, scrotal swelling, testicular pain and urgency.  Musculoskeletal: Positive for arthralgias and back pain. Negative for gait problem, joint swelling, myalgias, neck pain and neck stiffness.  Skin: Negative for color change, pallor, rash and wound.  Allergic/Immunologic: Negative for environmental allergies, food allergies and immunocompromised state.  Neurological: Negative for dizziness, tremors, seizures, syncope, facial asymmetry, speech difficulty, weakness, light-headedness, numbness and headaches.  Hematological: Negative for adenopathy. Does not bruise/bleed easily.  Psychiatric/Behavioral: Negative for agitation, behavioral problems, confusion, decreased concentration, dysphoric mood, hallucinations, self-injury, sleep disturbance and suicidal ideas. The patient is not nervous/anxious and is not hyperactive.     Past Medical History:  Diagnosis Date  . Allergic rhinitis, cause unspecified   . Allergy   . Chicken pox    childhood  . Essential hypertension, benign   . Hematuria, unspecified   . Impotence of organic origin   .  Measles    childhood  .  Mumps    childhood  . Nonspecific abnormal electrocardiogram (ECG) (EKG)   . Obstructive sleep apnea (adult) (pediatric)    CPAP  . Other abnormal glucose   . Other B-complex deficiencies   . Personal history of malignant neoplasm of prostate   . Personal history of urinary calculi   . Prostate cancer [facility]) 2005   Prostatectomy  . Pure hypercholesterolemia   . Unspecified urinary incontinence   . Unspecified vitamin D deficiency    Past Surgical History:  Procedure Laterality Date  . COLONOSCOPY  01/22/2013   normal.  [facility].  Repeat 5 years.  . ELBOW SURGERY    . PROSTATECTOMY  2005  . urological surgery  07/2009   No Known Allergies Current Outpatient Medications on File Prior to Visit  Medication Sig Dispense Refill  . ALPRAZolam (XANAX) 0.5 MG tablet TAKE 1 TABLET BY MOUTH EVERY DAY AS NEEDED FOR ANXIETY 30 tablet 3  . aspirin 81 MG tablet Take 81 mg by mouth daily.    . cholecalciferol (VITAMIN D) 1000 UNITS tablet Take 1,000 Units by mouth 2 (two) times daily.    . cloNIDine (CATAPRES) 0.1 MG tablet Take 1 tablet (0.1 mg total) by mouth 2 (two) times daily. 180 tablet 3  . Cyanocobalamin (VITAMIN B-12 PO) Take by mouth daily.    Marland Kitchen diltiazem (CARDIZEM CD) 180 MG 24 hr capsule Take 1 capsule (180 mg total) by mouth 2 (two) times daily. 180 capsule 3  . fluticasone (FLONASE) 50 MCG/ACT nasal spray PLACE 2 SPRAYS INTO BOTH NOSTRILS DAILY. 16 g 11  . loratadine (CLARITIN) 10 MG tablet TAKE 1 TABLET (10 MG TOTAL) BY MOUTH DAILY. 90 tablet 3  . tadalafil (CIALIS) 20 MG tablet TAKE 1 TABLET (20 MG TOTAL) BY MOUTH DAILY AS NEEDED FOR ERECTILE DYSFUNCTION. 8 tablet 11  . valsartan (DIOVAN) 320 MG tablet Take 320 mg by mouth daily.  3  . Zoster Vaccine Adjuvanted ([place]) injection INJECT INTO THE MUSCLE ONCE.     No current facility-administered medications on file prior to visit.    Social History   Socioeconomic History  . Marital status: Married     Spouse name: Not on file  . Number of children: 2  . Years of education: Not on file  . Highest education level: Not on file  Occupational History  . Occupation: [employer]  Social Needs  . Financial resource strain: Not on file  . Food insecurity:    Worry: Not on file    Inability: Not on file  . Transportation needs:    Medical: Not on file    Non-medical: Not on file  Tobacco Use  . Smoking status: Never Smoker  . Smokeless tobacco: Never Used  Substance and Sexual Activity  . Alcohol use: Yes    Alcohol/week: 0.0 oz    Comment: occasional drinks beer 1 per week  . Drug use: No  . Sexual activity: Yes    Partners: Female  Lifestyle  . Physical activity:    Days per week: Not on file    Minutes per session: Not on file  . Stress: Not on file  Relationships  . Social connections:    Talks on phone: Not on file    Gets together: Not on file    Attends religious service: Not on file    Active member of club or organization: Not on file    Attends meetings of clubs or organizations: Not on file  Relationship status: Not on file  . Intimate partner violence:    Fear of current or ex partner: Not on file    Emotionally abused: Not on file    Physically abused: Not on file    Forced sexual activity: Not on file  Other Topics Concern  . Not on file  Social History Narrative   Marital status:  Married x 26 years happily married.      Children:  2 children; 4 grandchildren.      Lives: with wife.  Children local.      Employment: [employer] x 46 years.  Plans to retire unknown.       Tobacco:  Never.      Alcohol: rare wine.      Drugs: none      Exercise: moderate, walking and running daily for 15 minutes.      Seatbelt:  Always uses seat belts.       Smoke alarm and carbon monoxide detector in the home.      Caffeine use: none.       Guns:  1 gun loaded; others unloaded.         Family History  Problem Relation Age of Onset  . Heart  disease Father 19       AMI age 6; CM.  Marland Kitchen Hypertension Father   . Mental illness Sister        home in [place]  . Alcohol abuse Sister   . Hypertension Mother   . Heart disease Mother        AMI years ago; no CABG/stents.  . Arthritis Mother   . Dementia Mother   . Heart disease Brother 41       AMI; CM       Objective:    BP 140/82   Pulse 75   Temp 98 F (36.7 C) (Oral)   Resp 16   Ht 5\' 11"  (1.803 m)   Wt 203 lb (92.1 kg)   SpO2 97%   BMI 28.31 kg/m  Physical Exam  Constitutional: He is oriented to person, place, and time. He appears well-developed and well-nourished. No distress.  HENT:  Head: Normocephalic and atraumatic.  Right Ear: External ear normal.  Left Ear: External ear normal.  Nose: Nose normal.  Mouth/Throat: Oropharynx is clear and moist.  Eyes: Pupils are equal, round, and reactive to light. Conjunctivae and EOM are normal.  Neck: Normal range of motion. Neck supple. Carotid bruit is not present. No thyromegaly present.  Cardiovascular: Normal rate, regular rhythm, normal heart sounds and intact distal pulses. Exam reveals no gallop and no friction rub.  No murmur heard. Pulmonary/Chest: Effort normal and breath sounds normal. He has no wheezes. He has no rales.  Abdominal: Soft. Bowel sounds are normal. He exhibits no distension and no mass. There is no tenderness. There is no rebound and no guarding.  Musculoskeletal:       Right shoulder: Normal.       Left shoulder: Normal.       Cervical back: Normal.       Lumbar back: He exhibits normal range of motion, no tenderness, no bony tenderness, no pain and no spasm.  Lymphadenopathy:    He has no cervical adenopathy.  Neurological: He is alert and oriented to person, place, and time. He has normal reflexes. No cranial nerve deficit. He exhibits normal muscle tone. Coordination normal.  Skin: Skin is warm and dry. No rash noted. He is not diaphoretic.  Psychiatric: He has a normal mood and affect. His  behavior is normal. Judgment and thought content normal.  Nursing note and vitals reviewed.  No results found.      Assessment & Plan:   1. Essential hypertension, benign   2. Seasonal allergic rhinitis due to pollen   3. Genuine stress incontinence, male   4. History of prostate cancer   5. Leg heaviness   6. Acute right-sided low back pain without sciatica   7. Degenerative disc disease, lumbar   8. Arthralgia of both hands     Hypertension: moderately well-controlled; obtain labs for chronic disease management. No changes to therapy at this time.  Urinary frequency and urinary incontinence with history of prostate cancer: improved; s/p Cipro therapy with improvement; refill of Ditropan XL provided.  Fatigue: persistent for two months without improvement; obtain labs.  Associated with lower back pain, arthralgias, leg heaviness, recent UTI.  Persistent fatigue despite improvement in back pain, hand pain, leg heaviness, and urinary symptoms.  Sleeping well.  Providing care to aging parents yet denies feeling of being overwhelmed/depressed. Able to hunt in the mornings prior to work despite fatigue.    Allergic Rhinitis: uncontrolled; restart Claritin therapy; continue Flonase.    Orders Placed This Encounter  Procedures  . CBC with Differential/Platelet  . Comprehensive metabolic panel    Order Specific Question:   Has the patient fasted?    Answer:   No  . POCT urinalysis dipstick   Meds ordered this encounter  Medications  . oxybutynin (DITROPAN-XL) 5 MG 24 hr tablet    Sig: Take 1 tablet (5 mg total) by mouth at bedtime.    Dispense:  90 tablet    Refill:  1    Return in about 3 months (around 05/05/2018) for follow-up chronic medical conditions IN [facility].   Nishita Isaacks Elayne Guerin, M.D. Primary Care at [facility] previously [facility] [address] [place], [place]  [address] [phone] phone [phone] fax

## 2018-02-04 ENCOUNTER — Encounter: Payer: Self-pay | Admitting: Family Medicine

## 2018-02-05 MED ORDER — LIDOCAINE HCL (PF) 1 % IJ SOLN
.50 | INTRAMUSCULAR | Status: DC
Start: ? — End: 2018-02-05

## 2018-02-05 MED ORDER — FEXOFENADINE HCL 60 MG PO TABS
60.00 | ORAL_TABLET | ORAL | Status: DC
Start: ? — End: 2018-02-05

## 2018-02-05 MED ORDER — DILTIAZEM HCL 90 MG PO TABS
90.00 | ORAL_TABLET | ORAL | Status: DC
Start: 2018-02-05 — End: 2018-02-05

## 2018-02-05 MED ORDER — GENERIC EXTERNAL MEDICATION
1000.00 | Status: DC
Start: 2018-02-06 — End: 2018-02-05

## 2018-02-05 MED ORDER — CLONIDINE HCL 0.1 MG PO TABS
0.10 | ORAL_TABLET | ORAL | Status: DC
Start: 2018-02-05 — End: 2018-02-05

## 2018-02-05 MED ORDER — FLUTICASONE PROPIONATE 50 MCG/ACT NA SUSP
1.00 | NASAL | Status: DC
Start: 2018-02-06 — End: 2018-02-05

## 2018-02-05 MED ORDER — CHOLECALCIFEROL 25 MCG (1000 UT) PO TABS
1000.00 | ORAL_TABLET | ORAL | Status: DC
Start: 2018-02-06 — End: 2018-02-05

## 2018-02-05 MED ORDER — OXYBUTYNIN CHLORIDE ER 5 MG PO TB24
5.00 | ORAL_TABLET | ORAL | Status: DC
Start: 2018-02-06 — End: 2018-02-05

## 2018-02-10 ENCOUNTER — Ambulatory Visit: Payer: 59 | Admitting: Family Medicine

## 2018-02-11 ENCOUNTER — Inpatient Hospital Stay: Payer: 59 | Admitting: Family Medicine

## 2018-02-14 MED ORDER — LACTATED RINGERS IV SOLN
INTRAVENOUS | Status: DC
Start: ? — End: 2018-02-14

## 2018-02-14 MED ORDER — CLONIDINE HCL 0.1 MG PO TABS
0.10 | ORAL_TABLET | ORAL | Status: DC
Start: 2018-02-14 — End: 2018-02-14

## 2018-02-14 MED ORDER — CHOLECALCIFEROL 25 MCG (1000 UT) PO TABS
1000.00 | ORAL_TABLET | ORAL | Status: DC
Start: 2018-02-15 — End: 2018-02-14

## 2018-02-14 MED ORDER — LIDOCAINE HCL (PF) 1 % IJ SOLN
.50 | INTRAMUSCULAR | Status: DC
Start: ? — End: 2018-02-14

## 2018-02-14 MED ORDER — OXYBUTYNIN CHLORIDE ER 5 MG PO TB24
5.00 | ORAL_TABLET | ORAL | Status: DC
Start: 2018-02-14 — End: 2018-02-14

## 2018-02-14 MED ORDER — GENERIC EXTERNAL MEDICATION
90.00 | Status: DC
Start: 2018-02-14 — End: 2018-02-14

## 2018-02-14 MED ORDER — FLUTICASONE PROPIONATE 50 MCG/ACT NA SUSP
1.00 | NASAL | Status: DC
Start: 2018-02-15 — End: 2018-02-14

## 2018-02-14 MED ORDER — LIDOCAINE HCL (PF) 1 % IJ SOLN
0.50 | INTRAMUSCULAR | Status: DC
Start: ? — End: 2018-02-14

## 2018-02-15 ENCOUNTER — Encounter: Payer: Self-pay | Admitting: Family Medicine

## 2018-02-15 ENCOUNTER — Other Ambulatory Visit: Payer: Self-pay

## 2018-02-15 ENCOUNTER — Ambulatory Visit: Payer: 59 | Admitting: Family Medicine

## 2018-02-15 VITALS — BP 148/78 | HR 63 | Temp 97.8°F | Ht 71.0 in | Wt 200.8 lb

## 2018-02-15 DIAGNOSIS — Z8719 Personal history of other diseases of the digestive system: Secondary | ICD-10-CM

## 2018-02-15 DIAGNOSIS — D62 Acute posthemorrhagic anemia: Secondary | ICD-10-CM

## 2018-02-15 LAB — POCT CBC
Granulocyte percent: 72.9 %G (ref 37–80)
HEMATOCRIT: 28.1 % — AB (ref 43.5–53.7)
HEMOGLOBIN: 9.4 g/dL — AB (ref 14.1–18.1)
Lymph, poc: 1.3 (ref 0.6–3.4)
MCH: 29.8 pg (ref 27–31.2)
MCHC: 33.6 g/dL (ref 31.8–35.4)
MCV: 88.6 fL (ref 80–97)
MID (CBC): 0.3 (ref 0–0.9)
MPV: 6.6 fL (ref 0–99.8)
POC GRANULOCYTE: 4.3 (ref 2–6.9)
POC LYMPH PERCENT: 22.5 %L (ref 10–50)
POC MID %: 4.6 % (ref 0–12)
Platelet Count, POC: 277 10*3/uL (ref 142–424)
RBC: 3.17 M/uL — AB (ref 4.69–6.13)
RDW, POC: 15.1 %
WBC: 5.9 10*3/uL (ref 4.6–10.2)

## 2018-02-15 NOTE — Patient Instructions (Addendum)
Hemoglobin has improved to 9.4 today.  Would recommend to continue holding aspirin, monitor your blood pressure outside of the office visit and if remaining over 140/90, follow-up to discuss change in medications.  If any return of bleeding as we discussed, would recommend evaluation through emergency room.  Follow-up in the next 2 weeks with Dr. Tamala Julian or myself if needed for repeat blood counts, and evaluation of blood pressure.  Let me know if there are questions in the meantime.    IF you received an x-ray today, you will receive an invoice from [facility]. Please contact [facility] at [phone] with questions or concerns regarding your invoice.   IF you received labwork today, you will receive an invoice from [facility]. Please contact [facility] at [phone] with questions or concerns regarding your invoice.   Our billing staff will not be able to assist you with questions regarding bills from these companies.  You will be contacted with the lab results as soon as they are available. The fastest way to get your results is to activate your My Chart account. Instructions are located on the last page of this paperwork. If you have not heard from [place] regarding the results in 2 weeks, please contact this office.

## 2018-02-15 NOTE — Progress Notes (Addendum)
Subjective:  By signing my name below, I, Essence Howell, attest that this documentation has been prepared under the direction and in the presence of Wendie Agreste, MD Electronically Signed: Ladene Artist, ED Scribe 02/15/2018 at 12:23 PM.   Patient ID: Juan Chavez, male    DOB: [birth year], 67 y.o.   MRN: [id number]  Chief Complaint  Patient presents with   Hospitalization Follow-up    for his diverticulosis   HPI Juan Chavez is a 67 y.o. male who presents to Primary Care at [facility] for hospital f/u. New pt to me. Prior pt of Dr. Tamala Julian, last seen 6/12. Admitted 6/13 at [facility] for rectal bleeding. Presented to ED for bleeding. On colonoscopy had bleeding diverticula. Treated with endoscopic clip. Determined to be diverticula bleed. Aspirin was held. Planned to gradually reintroduce home BP meds. Again admitted 6/20-24 for lower GI bleed and anemia after sev large bloody BMs on evening of 6/20. Hgb of 7.6 in ER, transfused 1 unit of panchro blood cells. Hematocrit remained stable on serial CBCs. Colonoscopy without active bleeding. Rpt colonoscopy on day 3 with prominent diverticula but no active bleeding. Discharged home with strict return precautions. Hgb on 6/24, 6.4 with range of 6.4-8.1 during most recent hospitalization.  Pt states that he has been feeling fine since discharge but has not had a BM. Denies sob, abdominal pain, blood in stools, lightheadedness, dizziness since discharge. States he was advised to monitor BMs and return only for recurrent large amount of blood in stools. He hasn't restarted aspirin at this time. Pt doesn't have an appointment scheduled with GI at this time.  Patient Active Problem List   Diagnosis Date Noted   Family history of cardiovascular disease 01/21/2017   Caregiver stress 11/17/2016   ASNHL (asymmetrical sensorineural hearing loss) 12/17/2015   Obesity (BMI 30.0-34.9) 07/03/2014   H/O malignant neoplasm of prostate 12/30/2012     Cystitis, radiation 12/30/2012   Other abnormal glucose 11/14/2012   Unspecified vitamin D deficiency 11/14/2012   Allergic rhinitis 11/14/2012   Essential hypertension, benign 08/02/2012   Genuine stress incontinence, male 07/26/2012   Past Medical History:  Diagnosis Date   Allergic rhinitis, cause unspecified    Allergy    Chicken pox    childhood   Essential hypertension, benign    Hematuria, unspecified    Impotence of organic origin    Measles    childhood   Mumps    childhood   Nonspecific abnormal electrocardiogram (ECG) (EKG)    Obstructive sleep apnea (adult) (pediatric)    CPAP   Other abnormal glucose    Other B-complex deficiencies    Personal history of malignant neoplasm of prostate    Personal history of urinary calculi    Prostate cancer ([facility]) 2005   Prostatectomy   Pure hypercholesterolemia    Unspecified urinary incontinence    Unspecified vitamin D deficiency    Past Surgical History:  Procedure Laterality Date   COLONOSCOPY  01/22/2013   normal.  [facility].  Repeat 5 years.   ELBOW SURGERY     PROSTATECTOMY  2005   urological surgery  07/2009   No Known Allergies Prior to Admission medications   Medication Sig Start Date End Date Taking? Authorizing Provider  ALPRAZolam Duanne Moron) 0.5 MG tablet TAKE 1 TABLET BY MOUTH EVERY DAY AS NEEDED FOR ANXIETY 10/29/17  Yes [name], MD  aspirin 81 MG tablet Take 81 mg by mouth daily.   Yes [provider]  cholecalciferol (VITAMIN D) 1000 UNITS tablet Take 1,000 Units by mouth 2 (two) times daily.   Yes [provider]  cloNIDine (CATAPRES) 0.1 MG tablet Take 1 tablet (0.1 mg total) by mouth 2 (two) times daily. 10/29/17  Yes [name], MD  Cyanocobalamin (VITAMIN B-12 PO) Take by mouth daily.   Yes [provider]  diltiazem (CARDIZEM CD) 180 MG 24 hr capsule Take 1 capsule (180 mg total) by mouth 2 (two) times daily. 10/29/17  Yes [name], MD   fluticasone (FLONASE) 50 MCG/ACT nasal spray PLACE 2 SPRAYS INTO BOTH NOSTRILS DAILY. 10/29/17  Yes [name], MD  loratadine (CLARITIN) 10 MG tablet TAKE 1 TABLET (10 MG TOTAL) BY MOUTH DAILY. 10/29/17  Yes [name], MD  mirabegron ER (MYRBETRIQ) 50 MG TB24 tablet Take 1 tablet (50 mg total) by mouth daily. For urinary incontinence 05/18/17  Yes [name], MD  oxybutynin (DITROPAN-XL) 5 MG 24 hr tablet Take 1 tablet (5 mg total) by mouth at bedtime. 02/02/18  Yes [name], MD  Psyllium (METAMUCIL PO) Take 3.4 g by mouth. 02/14/18 03/16/18 Yes [provider]  senna (SENOKOT) 8.6 MG tablet Take by mouth. 02/14/18 02/14/19 Yes [provider]  tadalafil (CIALIS) 20 MG tablet TAKE 1 TABLET (20 MG TOTAL) BY MOUTH DAILY AS NEEDED FOR ERECTILE DYSFUNCTION. 10/29/17  Yes [name], MD  valsartan (DIOVAN) 320 MG tablet Take 320 mg by mouth daily. 01/12/18  Yes [provider]  Zoster Vaccine Adjuvanted [facility]) injection INJECT INTO THE MUSCLE ONCE. 01/21/17  Yes [provider]   Social History   Socioeconomic History   Marital status: Married    Spouse name: Not on file   Number of children: 2   Years of education: Not on file   Highest education level: Not on file  Occupational History   Occupation: [employer]  Social Needs   Financial resource strain: Not on file   Food insecurity:    Worry: Not on file    Inability: Not on file   Transportation needs:    Medical: Not on file    Non-medical: Not on file  Tobacco Use   Smoking status: Never Smoker   Smokeless tobacco: Never Used  Substance and Sexual Activity   Alcohol use: Yes    Alcohol/week: 0.0 oz    Comment: occasional drinks beer 1 per week   Drug use: No   Sexual activity: Yes    Partners: Female  Lifestyle   Physical activity:    Days per week: Not on file    Minutes per session: Not on file   Stress: Not on file  Relationships    Social connections:    Talks on phone: Not on file    Gets together: Not on file    Attends religious service: Not on file    Active member of club or organization: Not on file    Attends meetings of clubs or organizations: Not on file    Relationship status: Not on file   Intimate partner violence:    Fear of current or ex partner: Not on file    Emotionally abused: Not on file    Physically abused: Not on file    Forced sexual activity: Not on file  Other Topics Concern   Not on file  Social History Narrative   Marital status:  Married x 14 years happily married.      Children:  2 children; 4 grandchildren.      Lives: with wife.  Children local.      Employment: [employer] x 46 years.  Plans to retire unknown.       Tobacco:  Never.      Alcohol: rare wine.      Drugs: none      Exercise: moderate, walking and running daily for 15 minutes.      Seatbelt:  Always uses seat belts.       Smoke alarm and carbon monoxide detector in the home.      Caffeine use: none.       Guns:  1 gun loaded; others unloaded.         Review of Systems  Respiratory: Negative for shortness of breath.   Gastrointestinal: Negative for abdominal pain and blood in stool.  Neurological: Negative for dizziness and light-headedness.      Objective:   Physical Exam  Constitutional: He is oriented to person, place, and time. He appears well-developed and well-nourished. No distress.  HENT:  Head: Normocephalic and atraumatic.  Eyes: Pupils are equal, round, and reactive to light. Conjunctivae and EOM are normal.  Neck: Neck supple. No JVD present. Carotid bruit is not present. No tracheal deviation present.  Cardiovascular: Normal rate, regular rhythm and normal heart sounds.  No murmur heard. Pulmonary/Chest: Effort normal and breath sounds normal. No respiratory distress. He has no rales.  Abdominal: Soft. He exhibits no distension. There is no tenderness.  Musculoskeletal: Normal range of  motion. He exhibits no edema.  Neurological: He is alert and oriented to person, place, and time.  Skin: Skin is warm and dry.  Psychiatric: He has a normal mood and affect. His behavior is normal.  Nursing note and vitals reviewed.  Vitals:   02/15/18 1124 02/15/18 1127  BP: (!) 150/80 (!) 148/78  Pulse: 63   Temp: 97.8 F (36.6 C)   TempSrc: Oral   SpO2: 99%   Weight: 200 lb 12.8 oz (91.1 kg)   Height: 5\' 11"  (1.803 m)    Results for orders placed or performed in visit on 02/15/18  POCT CBC  Result Value Ref Range   WBC 5.9 4.6 - 10.2 K/uL   Lymph, poc 1.3 0.6 - 3.4   POC LYMPH PERCENT 22.5 10 - 50 %L   MID (cbc) 0.3 0 - 0.9   POC MID % 4.6 0 - 12 %M   POC Granulocyte 4.3 2 - 6.9   Granulocyte percent 72.9 37 - 80 %G   RBC 3.17 (A) 4.69 - 6.13 M/uL   Hemoglobin 9.4 (A) 14.1 - 18.1 g/dL   HCT, POC 28.1 (A) 43.5 - 53.7 %   MCV 88.6 80 - 97 fL   MCH, POC 29.8 27 - 31.2 pg   MCHC 33.6 31.8 - 35.4 g/dL   RDW, POC 15.1 %   Platelet Count, POC 277 142 - 424 K/uL   MPV 6.6 0 - 99.8 fL       Assessment & Plan:  Juan Chavez is a 67 y.o. male History of GI diverticular bleed - Plan: POCT CBC  Anemia due to acute blood loss - Plan: POCT CBC  Status post hospitalization x2 for gastrointestinal bleeding, thought to be due to diverticular bleed status post clip placement.  Required blood transfusion.  Denies new fatigue, lightheadedness, dizziness at this time.  Hemoglobin has improved as above.  Denies recent bleeding.  -No change in med regimen for now,  but with increasing blood pressure, monitor outside of office and if remaining over 140/90, return for medication adjustment.  -If any return of bleeding, emergency room evaluation.  If only small amount of blood with wiping, potentially could be from internal hemorrhoids, but with degree of previous bleed did recommend ER evaluation for return of bleeding.  -Return 2 weeks for recheck CBC and BP evaluation, sooner if  needed.  No orders of the defined types were placed in this encounter.  Patient Instructions   Hemoglobin has improved to 9.4 today.  Would recommend to continue holding aspirin, monitor your blood pressure outside of the office visit and if remaining over 140/90, follow-up to discuss change in medications.  If any return of bleeding as we discussed, would recommend evaluation through emergency room.  Follow-up in the next 2 weeks with Dr. Tamala Julian or myself if needed for repeat blood counts, and evaluation of blood pressure.  Let me know if there are questions in the meantime.    IF you received an x-ray today, you will receive an invoice from [facility]. Please contact [facility] at [phone] with questions or concerns regarding your invoice.   IF you received labwork today, you will receive an invoice from [place]. Please contact [facility] at [phone] with questions or concerns regarding your invoice.   Our billing staff will not be able to assist you with questions regarding bills from these companies.  You will be contacted with the lab results as soon as they are available. The fastest way to get your results is to activate your My Chart account. Instructions are located on the last page of this paperwork. If you have not heard from [place] regarding the results in 2 weeks, please contact this office.       I personally performed the services described in this documentation, which was scribed in my presence. The recorded information has been reviewed and considered for accuracy and completeness, addended by me as needed, and agree with information above.  Signed,   Merri Ray, MD Primary Care at [place].  02/15/18 1:22 PM   3:58 PM 02/17/18 Addendum.  Patient stopped by office on Thursday, June 20 17 with a return to work form.  He did have some fatigue after leaving the office the other day, but that has improved and feels nearly  100% better.  Form completed for him to return to work full duty in 4 days as long as he is not having any fatigue, lightheadedness, or dizziness, and feels that he is still at 100%.  If any return of fatigue, lightheadedness, or dizziness, advised to not return to work and instead should return to discuss potential restrictions with possible early recheck of his hemoglobin.  Understanding expressed.

## 2018-02-17 ENCOUNTER — Telehealth: Payer: Self-pay | Admitting: Family Medicine

## 2018-02-17 NOTE — Telephone Encounter (Signed)
Pt has come to the office and spoke to Dr. Carlota Raspberry. [name]  [name]

## 2018-02-17 NOTE — Telephone Encounter (Signed)
Copied from [place] [phone]. Topic: General - Other >> Feb 17, 2018 12:42 PM Marin Olp L wrote: Reason for CRM: Patient needs a note to have his work absence extended to 06/27/01/2018. Please cal patient back to let him know if Dr. Nyoka Cowden will write him out.

## 2018-03-03 ENCOUNTER — Ambulatory Visit: Payer: 59 | Admitting: Family Medicine

## 2018-03-03 ENCOUNTER — Encounter: Payer: Self-pay | Admitting: Family Medicine

## 2018-03-03 ENCOUNTER — Other Ambulatory Visit: Payer: Self-pay

## 2018-03-03 VITALS — BP 160/86 | HR 62 | Temp 97.7°F | Ht 71.0 in | Wt 203.4 lb

## 2018-03-03 DIAGNOSIS — I10 Essential (primary) hypertension: Secondary | ICD-10-CM

## 2018-03-03 DIAGNOSIS — D649 Anemia, unspecified: Secondary | ICD-10-CM | POA: Diagnosis not present

## 2018-03-03 LAB — POCT CBC
GRANULOCYTE PERCENT: 72.4 % (ref 37–80)
HCT, POC: 29.8 % — AB (ref 43.5–53.7)
Hemoglobin: 9.1 g/dL — AB (ref 14.1–18.1)
Lymph, poc: 1.1 (ref 0.6–3.4)
MCH: 26.6 pg — AB (ref 27–31.2)
MCHC: 30.6 g/dL — AB (ref 31.8–35.4)
MCV: 87 fL (ref 80–97)
MID (cbc): 0.3 (ref 0–0.9)
MPV: 7.9 fL (ref 0–99.8)
POC Granulocyte: 3.7 (ref 2–6.9)
POC LYMPH PERCENT: 21.7 %L (ref 10–50)
POC MID %: 5.9 %M (ref 0–12)
Platelet Count, POC: 164 10*3/uL (ref 142–424)
RBC: 3.43 M/uL — AB (ref 4.69–6.13)
RDW, POC: 14.3 %
WBC: 5.1 10*3/uL (ref 4.6–10.2)

## 2018-03-03 NOTE — Progress Notes (Signed)
Subjective:  By signing my name below, I, Moises Blood, attest that this documentation has been prepared under the direction and in the presence of Merri Ray, MD. Electronically Signed: Moises Blood, Brice. 03/03/2018 , 4:33 PM .  Patient was seen in Room 10 .   Patient ID: Juan Chavez, male    DOB: 06/13/1951, 67 y.o.   MRN: [id number] Chief Complaint  Patient presents with  . Blood pressure and blood count    2 week follow up    HPI Juan Chavez is a 67 y.o. male  Here for follow up. He was last seen on June 25th for hospital follow up of diverticular bleeding and anemia requiring transfusion. He was kept off aspirin, and had initially held his antihypertensives. Then, he had resumed normal medications including his Cardizem 180 mg bid and catapres 0.1 mg bid, as well as Diovan 320 mg qd. His discharge BP on June 24th was 138/76.   HTN  As above.   BP Readings from Last 3 Encounters:  03/03/18 (!) 160/88  02/15/18 (!) 148/78  02/02/18 140/82   He hasn't been checking his BP at home. He denies missing any of his medications. He denies chest pain or shortness of breath. He denies any side effects from his medications.   Anemia Lab Results  Component Value Date   WBC 5.9 02/15/2018   HGB 9.4 (A) 02/15/2018   HCT 28.1 (A) 02/15/2018   MCV 88.6 02/15/2018   PLT 165 02/02/2018   His hemoglobin was 8.1 on June 24th, slightly down from 8.7 on June 23rd. I did provide a letter for his work on June 30th; planned to return to work on July 3rd, as he was feeling nearly 100% better.   Patient has been back to work and doing well. He denies lightheadedness, dizziness or blood in stool. He doesn't feel like his fatigue is completely resolved, but still more improved compared to last visit.   Patient Active Problem List   Diagnosis Date Noted  . Family history of cardiovascular disease 01/21/2017  . Caregiver stress 11/17/2016  . ASNHL (asymmetrical sensorineural  hearing loss) 12/17/2015  . Obesity (BMI 30.0-34.9) 07/03/2014  . H/O malignant neoplasm of prostate 12/30/2012  . 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 Other abnormal glucose 11/14/2012  . Unspecified vitamin D deficiency 11/14/2012  . Allergic rhinitis 11/14/2012  . Essential hypertension, benign 08/02/2012  . Genuine stress incontinence, male 07/26/2012   Past Medical History:  Diagnosis Date  . Allergic rhinitis, cause unspecified   . Allergy   . Chicken pox    childhood  . Essential hypertension, benign   . Hematuria, unspecified   . Impotence of organic origin   . Measles    childhood  . Mumps    childhood  . Nonspecific abnormal electrocardiogram (ECG) (EKG)   . Obstructive sleep apnea (adult) (pediatric)    CPAP  . Other abnormal glucose   . Other B-complex deficiencies   . Personal history of malignant neoplasm of prostate   . Personal history of urinary calculi   . Prostate cancer [facility]) 2005   Prostatectomy  . Pure hypercholesterolemia   . Unspecified urinary incontinence   . Unspecified vitamin D deficiency    Past Surgical History:  Procedure Laterality Date  . COLONOSCOPY  01/22/2013   normal.  [facility].  Repeat 5 years.  . ELBOW SURGERY    . PROSTATECTOMY  2005  . urological surgery  07/2009   No Known Allergies Prior  to Admission medications   Medication Sig Start Date End Date Taking? Authorizing Provider  ALPRAZolam Duanne Moron) 0.5 MG tablet TAKE 1 TABLET BY MOUTH EVERY DAY AS NEEDED FOR ANXIETY 10/29/17   [name], MD  aspirin 81 MG tablet Take 81 mg by mouth daily.    [provider]  cholecalciferol (VITAMIN D) 1000 UNITS tablet Take 1,000 Units by mouth 2 (two) times daily.    [provider]  cloNIDine (CATAPRES) 0.1 MG tablet Take 1 tablet (0.1 mg total) by mouth 2 (two) times daily. 10/29/17   [name], MD  Cyanocobalamin (VITAMIN B-12 PO) Take by mouth daily.    [provider]  diltiazem (CARDIZEM CD) 180 MG 24 hr  capsule Take 1 capsule (180 mg total) by mouth 2 (two) times daily. 10/29/17   [name], MD  fluticasone (FLONASE) 50 MCG/ACT nasal spray PLACE 2 SPRAYS INTO BOTH NOSTRILS DAILY. 10/29/17   [name], MD  loratadine (CLARITIN) 10 MG tablet TAKE 1 TABLET (10 MG TOTAL) BY MOUTH DAILY. 10/29/17   [name], MD  mirabegron ER (MYRBETRIQ) 50 MG TB24 tablet Take 1 tablet (50 mg total) by mouth daily. For urinary incontinence 05/18/17   [name], MD  oxybutynin (DITROPAN-XL) 5 MG 24 hr tablet Take 1 tablet (5 mg total) by mouth at bedtime. 02/02/18   [name], MD  Psyllium (METAMUCIL PO) Take 3.4 g by mouth. 02/14/18 03/16/18  [provider]  senna (SENOKOT) 8.6 MG tablet Take by mouth. 02/14/18 02/14/19  [provider]  tadalafil (CIALIS) 20 MG tablet TAKE 1 TABLET (20 MG TOTAL) BY MOUTH DAILY AS NEEDED FOR ERECTILE DYSFUNCTION. 10/29/17   [name], MD  valsartan (DIOVAN) 320 MG tablet Take 320 mg by mouth daily. 01/12/18   [provider]  Zoster Vaccine Adjuvanted [facility]) injection INJECT INTO THE MUSCLE ONCE. 01/21/17   [provider]   Social History   Socioeconomic History  . Marital status: Married    Spouse name: Not on file  . Number of children: 2  . Years of education: Not on file  . Highest education level: Not on file  Occupational History  . Occupation: [employer]  Social Needs  . Financial resource strain: Not on file  . Food insecurity:    Worry: Not on file    Inability: Not on file  . Transportation needs:    Medical: Not on file    Non-medical: Not on file  Tobacco Use  . Smoking status: Never Smoker  . Smokeless tobacco: Never Used  Substance and Sexual Activity  . Alcohol use: Yes    Alcohol/week: 0.0 oz    Comment: occasional drinks beer 1 per week  . Drug use: No  . Sexual activity: Yes    Partners: Female  Lifestyle  . Physical activity:    Days per week: Not on file     Minutes per session: Not on file  . Stress: Not on file  Relationships  . Social connections:    Talks on phone: Not on file    Gets together: Not on file    Attends religious service: Not on file    Active member of club or organization: Not on file    Attends meetings of clubs or organizations: Not on file    Relationship status: Not on file  . Intimate partner violence:    Fear of current or ex partner: Not on  file    Emotionally abused: Not on file    Physically abused: Not on file    Forced sexual activity: Not on file  Other Topics Concern  . Not on file  Social History Narrative   Marital status:  Married x 54 years happily married.      Children:  2 children; 4 grandchildren.      Lives: with wife.  Children local.      Employment: [employer] x 46 years.  Plans to retire unknown.       Tobacco:  Never.      Alcohol: rare wine.      Drugs: none      Exercise: moderate, walking and running daily for 15 minutes.      Seatbelt:  Always uses seat belts.       Smoke alarm and carbon monoxide detector in the home.      Caffeine use: none.       Guns:  1 gun loaded; others unloaded.         Review of Systems  Constitutional: Positive for fatigue. Negative for unexpected weight change.  Eyes: Negative for visual disturbance.  Respiratory: Negative for cough, chest tightness and shortness of breath.   Cardiovascular: Negative for chest pain, palpitations and leg swelling.  Gastrointestinal: Negative for abdominal pain and blood in stool.  Neurological: Negative for dizziness, light-headedness and headaches.       Objective:   Physical Exam  Constitutional: He is oriented to person, place, and time. He appears well-developed and well-nourished.  HENT:  Head: Normocephalic and atraumatic.  Eyes: Pupils are equal, round, and reactive to light. EOM are normal.  Neck: No JVD present. Carotid bruit is not present.  Cardiovascular: Normal rate, regular rhythm and normal heart  sounds.  No murmur heard. Pulmonary/Chest: Effort normal and breath sounds normal. He has no rales.  Musculoskeletal: He exhibits no edema.  Neurological: He is alert and oriented to person, place, and time.  Skin: Skin is warm and dry.  Psychiatric: He has a normal mood and affect.  Vitals reviewed.   Vitals:   03/03/18 1603 03/03/18 1606  BP: (!) 182/89 (!) 160/88  Pulse: 62   Temp: 97.7 F (36.5 C)   TempSrc: Oral   SpO2: 100%   Weight: 203 lb 6.4 oz (92.3 kg)   Height: 5\' 11"  (1.803 m)    Results for orders placed or performed in visit on 03/03/18  POCT CBC  Result Value Ref Range   WBC 5.1 4.6 - 10.2 K/uL   Lymph, poc 1.1 0.6 - 3.4   POC LYMPH PERCENT 21.7 10 - 50 %L   MID (cbc) 0.3 0 - 0.9   POC MID % 5.9 0 - 12 %M   POC Granulocyte 3.7 2 - 6.9   Granulocyte percent 72.4 37 - 80 %G   RBC 3.43 (A) 4.69 - 6.13 M/uL   Hemoglobin 9.1 (A) 14.1 - 18.1 g/dL   HCT, POC 29.8 (A) 43.5 - 53.7 %   MCV 87.0 80 - 97 fL   MCH, POC 26.6 (A) 27 - 31.2 pg   MCHC 30.6 (A) 31.8 - 35.4 g/dL   RDW, POC 14.3 %   Platelet Count, POC 164 142 - 424 K/uL   MPV 7.9 0 - 99.8 fL       Assessment & Plan:   Juan Chavez is a 67 y.o. male Anemia, unspecified type - Plan: POCT CBC, Iron, TIBC and Ferritin Panel  -  Persistent anemia, initially thought to be from diverticular bleed.  Denies any recent dark/tarry stools or signs of bleeding.  Hemoglobin minimally changed from last visit at 9.1 from 9.4.  Although still some fatigue, states he is feeling better than previous visits.  -Not currently on iron supplement, will check iron levels, start iron once per day, potential side effects including constipation discussed with recommendations for stool softener.  -Recheck CBC in 2 weeks, ER precautions if any signs of bleeding.  Essential hypertension  -Uncontrolled.  On max dose of ARB and calcium channel blocker.  Will slowly increase medication given anemia as above.  For now can increase  clonidine to 0.2 mg in the morning, remain at point 1 at night with home blood pressure monitoring and recheck in 2 weeks.  No orders of the defined types were placed in this encounter.  Patient Instructions   Blood count still low, slightly decreased from last visit but only minimally.  I will check an iron test but would recommend over-the-counter iron supplement once per day for now.  You may need to use a stool softener as that sometimes can cause constipation.  Recheck blood counts in the next 2 weeks.  Blood pressure is running a little bit too high.  Okay to increase clonidine to 2 pills in the morning, 1 at night for now with close monitoring of your home blood pressures.  Watch for low blood pressure readings, especially with the current anemia. Keep a record of your blood pressures outside of the office and bring them to the next office visit.   Return to the clinic or go to the nearest emergency room if any of your symptoms worsen or new symptoms occur.       IF you received an x-ray today, you will receive an invoice from [facility]. Please contact [facility] at [phone] with questions or concerns regarding your invoice.   IF you received labwork today, you will receive an invoice from [place]. Please contact [facility] at [phone] with questions or concerns regarding your invoice.   Our billing staff will not be able to assist you with questions regarding bills from these companies.  You will be contacted with the lab results as soon as they are available. The fastest way to get your results is to activate your My Chart account. Instructions are located on the last page of this paperwork. If you have not heard from [place] regarding the results in 2 weeks, please contact this office.       I personally performed the services described in this documentation, which was scribed in my presence. The recorded information has been reviewed and considered for  accuracy and completeness, addended by me as needed, and agree with information above.  Signed,   Merri Ray, MD Primary Care at [place].  03/03/18 5:27 PM

## 2018-03-03 NOTE — Patient Instructions (Addendum)
Blood count still low, slightly decreased from last visit but only minimally.  I will check an iron test but would recommend over-the-counter iron supplement once per day for now.  You may need to use a stool softener as that sometimes can cause constipation.  Recheck blood counts in the next 2 weeks.  Blood pressure is running a little bit too high.  Okay to increase clonidine to 2 pills in the morning, 1 at night for now with close monitoring of your home blood pressures.  Watch for low blood pressure readings, especially with the current anemia. Keep a record of your blood pressures outside of the office and bring them to the next office visit.   Return to the clinic or go to the nearest emergency room if any of your symptoms worsen or new symptoms occur.       IF you received an x-ray today, you will receive an invoice from [facility]. Please contact [facility] at [phone] with questions or concerns regarding your invoice.   IF you received labwork today, you will receive an invoice from [place]. Please contact [facility] at [phone] with questions or concerns regarding your invoice.   Our billing staff will not be able to assist you with questions regarding bills from these companies.  You will be contacted with the lab results as soon as they are available. The fastest way to get your results is to activate your My Chart account. Instructions are located on the last page of this paperwork. If you have not heard from [place] regarding the results in 2 weeks, please contact this office.

## 2018-03-04 LAB — IRON,TIBC AND FERRITIN PANEL
Ferritin: 16 ng/mL — ABNORMAL LOW (ref 30–400)
IRON: 34 ug/dL — AB (ref 38–169)
Iron Saturation: 10 % — ABNORMAL LOW (ref 15–55)
Total Iron Binding Capacity: 337 ug/dL (ref 250–450)
UIBC: 303 ug/dL (ref 111–343)

## 2018-03-17 ENCOUNTER — Ambulatory Visit: Payer: 59 | Admitting: Family Medicine

## 2018-03-17 ENCOUNTER — Other Ambulatory Visit: Payer: Self-pay

## 2018-03-17 ENCOUNTER — Encounter: Payer: Self-pay | Admitting: Family Medicine

## 2018-03-17 VITALS — BP 144/78 | HR 57 | Temp 97.6°F | Ht 71.0 in | Wt 204.4 lb

## 2018-03-17 DIAGNOSIS — D649 Anemia, unspecified: Secondary | ICD-10-CM | POA: Diagnosis not present

## 2018-03-17 DIAGNOSIS — I10 Essential (primary) hypertension: Secondary | ICD-10-CM | POA: Diagnosis not present

## 2018-03-17 LAB — CBC
Hematocrit: 33.9 % — ABNORMAL LOW (ref 37.5–51.0)
Hemoglobin: 11 g/dL — ABNORMAL LOW (ref 13.0–17.7)
MCH: 28.7 pg (ref 26.6–33.0)
MCHC: 32.4 g/dL (ref 31.5–35.7)
MCV: 89 fL (ref 79–97)
PLATELETS: 177 10*3/uL (ref 150–450)
RBC: 3.83 x10E6/uL — ABNORMAL LOW (ref 4.14–5.80)
RDW: 14.2 % (ref 12.3–15.4)
WBC: 5.5 10*3/uL (ref 3.4–10.8)

## 2018-03-17 NOTE — Progress Notes (Signed)
Subjective:  By signing my name below, I, Essence Howell, attest that this documentation has been prepared under the direction and in the presence of Wendie Agreste, MD Electronically Signed: Ladene Artist, ED Scribe 03/17/2018 at 8:47 AM.   Patient ID: Juan Chavez, male    DOB: [birth year], 67 y.o.   MRN: [id number]  Chief Complaint  Patient presents with  . BP and Anemia Check    2 week f/u   HPI Juan Chavez is a 67 y.o. male who presents to Primary Care at [facility] for f/u.  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 Recommended he start Fe supplement at 7/11 visit. Hgb decreased from 9.1 to 9.4 without new bleeding at that time. Fe levels were low on testing last OV. - Pt states that he has been taking supplements x 2 months. He has noticed improvement in energy levels. Denies blood in stools, melena. Lab Results  Component Value Date   WBC 5.1 03/03/2018   HGB 9.1 (A) 03/03/2018   HCT 29.8 (A) 03/03/2018   MCV 87.0 03/03/2018   PLT 165 02/02/2018   HTN BP Readings from Last 3 Encounters:  03/17/18 (!) 160/80  03/03/18 (!) 160/86  02/15/18 (!) 148/78  Increased clonidine to 0.2 mg in the morning, remaining at 0.1 mg at night. Same dose of ARB and CCB. - Pt reports home BP readings of 128-130/77. Denies cp, sob.  Patient Active Problem List   Diagnosis Date Noted  . Family history of cardiovascular disease 01/21/2017  . Caregiver stress 11/17/2016  . ASNHL (asymmetrical sensorineural hearing loss) 12/17/2015  . Obesity (BMI 30.0-34.9) 07/03/2014  . H/O malignant neoplasm of prostate 12/30/2012  . 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 Other abnormal glucose 11/14/2012  . Unspecified vitamin D deficiency 11/14/2012  . Allergic rhinitis 11/14/2012  . Essential hypertension, benign 08/02/2012  . Genuine stress incontinence, male 07/26/2012   Past Medical History:  Diagnosis Date  . Allergic rhinitis, cause unspecified   . Allergy   . Chicken pox    childhood  . Essential hypertension, benign   . Hematuria, unspecified   . Impotence of organic origin   . Measles    childhood  . Mumps    childhood  . Nonspecific abnormal electrocardiogram (ECG) (EKG)   . Obstructive sleep apnea (adult) (pediatric)    CPAP  . Other abnormal glucose   . Other B-complex deficiencies   . Personal history of malignant neoplasm of prostate   . Personal history of urinary calculi   . Prostate cancer [facility]) 2005   Prostatectomy  . Pure hypercholesterolemia   . Unspecified urinary incontinence   . Unspecified vitamin D deficiency    Past Surgical History:  Procedure Laterality Date  . COLONOSCOPY  01/22/2013   normal.  [facility].  Repeat 5 years.  . ELBOW SURGERY    . PROSTATECTOMY  2005  . urological surgery  07/2009   No Known Allergies Prior to Admission medications   Medication Sig Start Date End Date Taking? Authorizing Provider  ALPRAZolam Duanne Moron) 0.5 MG tablet TAKE 1 TABLET BY MOUTH EVERY DAY AS NEEDED FOR ANXIETY 10/29/17   [name], MD  aspirin 81 MG tablet Take 81 mg by mouth daily.    [provider]  cholecalciferol (VITAMIN D) 1000 UNITS tablet Take 1,000 Units by mouth 2 (two) times daily.    [provider]  cloNIDine (CATAPRES) 0.1 MG tablet Take 1 tablet (0.1 mg total) by mouth 2 (two)  times daily. 10/29/17   [name], MD  Cyanocobalamin (VITAMIN B-12 PO) Take by mouth daily.    [provider]  diltiazem (CARDIZEM CD) 180 MG 24 hr capsule Take 1 capsule (180 mg total) by mouth 2 (two) times daily. 10/29/17   [name], MD  fluticasone (FLONASE) 50 MCG/ACT nasal spray PLACE 2 SPRAYS INTO BOTH NOSTRILS DAILY. 10/29/17   [name], MD  loratadine (CLARITIN) 10 MG tablet TAKE 1 TABLET (10 MG TOTAL) BY MOUTH DAILY. 10/29/17   [name], MD  mirabegron ER (MYRBETRIQ) 50 MG TB24 tablet Take 1 tablet (50 mg total) by mouth daily. For urinary incontinence 05/18/17   [name], MD  oxybutynin  (DITROPAN-XL) 5 MG 24 hr tablet Take 1 tablet (5 mg total) by mouth at bedtime. 02/02/18   [name], MD  senna [facility]) 8.6 MG tablet Take by mouth. 02/14/18 02/14/19  [provider]  tadalafil (CIALIS) 20 MG tablet TAKE 1 TABLET (20 MG TOTAL) BY MOUTH DAILY AS NEEDED FOR ERECTILE DYSFUNCTION. 10/29/17   [name], MD  valsartan (DIOVAN) 320 MG tablet Take 320 mg by mouth daily. 01/12/18   [provider]  Zoster Vaccine Adjuvanted [facility]) injection INJECT INTO THE MUSCLE ONCE. 01/21/17   [provider]   Social History   Socioeconomic History  . Marital status: Married    Spouse name: Not on file  . Number of children: 2  . Years of education: Not on file  . Highest education level: Not on file  Occupational History  . Occupation: [employer]  Social Needs  . Financial resource strain: Not on file  . Food insecurity:    Worry: Not on file    Inability: Not on file  . Transportation needs:    Medical: Not on file    Non-medical: Not on file  Tobacco Use  . Smoking status: Never Smoker  . Smokeless tobacco: Never Used  Substance and Sexual Activity  . Alcohol use: Yes    Alcohol/week: 0.0 oz    Comment: occasional drinks beer 1 per week  . Drug use: No  . Sexual activity: Yes    Partners: Female  Lifestyle  . Physical activity:    Days per week: Not on file    Minutes per session: Not on file  . Stress: Not on file  Relationships  . Social connections:    Talks on phone: Not on file    Gets together: Not on file    Attends religious service: Not on file    Active member of club or organization: Not on file    Attends meetings of clubs or organizations: Not on file    Relationship status: Not on file  . Intimate partner violence:    Fear of current or ex partner: Not on file    Emotionally abused: Not on file    Physically abused: Not on file    Forced sexual activity: Not on file  Other Topics Concern  . Not  on file  Social History Narrative   Marital status:  Married x 22 years happily married.      Children:  2 children; 4 grandchildren.      Lives: with wife.  Children local.      Employment: [employer] x 46 years.  Plans to retire unknown.       Tobacco:  Never.      Alcohol: rare wine.  Drugs: none      Exercise: moderate, walking and running daily for 15 minutes.      Seatbelt:  Always uses seat belts.       Smoke alarm and carbon monoxide detector in the home.      Caffeine use: none.       Guns:  1 gun loaded; others unloaded.         Review of Systems  Constitutional: Negative for fatigue.  Respiratory: Negative for shortness of breath.   Cardiovascular: Negative for chest pain.  Gastrointestinal: Negative for blood in stool.      Objective:   Physical Exam  Constitutional: He is oriented to person, place, and time. He appears well-developed and well-nourished.  HENT:  Head: Normocephalic and atraumatic.  Eyes: Pupils are equal, round, and reactive to light. EOM are normal.  Neck: No JVD present. Carotid bruit is not present.  Cardiovascular: Normal rate, regular rhythm and normal heart sounds.  No murmur heard. Pulmonary/Chest: Effort normal and breath sounds normal. He has no rales.  Musculoskeletal: He exhibits no edema.  Neurological: He is alert and oriented to person, place, and time.  Skin: Skin is warm and dry.  Psychiatric: He has a normal mood and affect.  Vitals reviewed.  Vitals:   03/17/18 0835 03/17/18 0836 03/17/18 0906  BP: (!) 168/82 (!) 160/80 (!) 144/78  Pulse: (!) 57    Temp: 97.6 F (36.4 C)    TempSrc: Oral    SpO2: 100%    Weight: 204 lb 6.4 oz (92.7 kg)    Height: 5\' 11"  (1.803 m)        Assessment & Plan:   Juan Chavez is a 67 y.o. male Anemia, unspecified type - Plan: CBC  -Prior from diverticular bleeding.  Status post transfusion in the hospital.  Energy levels continue to improve, slight iron deficiency noted on last  labs, now taking iron supplement.  Symptomatically improved.   - Check CBC, continue iron supplement once per day for now.  RTC precautions/ER precautions if any rectal bleeding/dark stools or increasing fatigue.  Recheck 1 month  Essential hypertension  -Improved reading on recheck.  Home readings controlled.  No change in regimen for now, recheck levels in 1 month.  Handout given on appropriate technique for checking blood pressures.  No orders of the defined types were placed in this encounter.  Patient Instructions    Based on home blood pressures, I will not change meds today. Make sure you check it the correct way as listed below. If those readings are over 140/90 - return to discuss med changes. As long as blood counts are improving, can follow up for repeat testing in 1 month.    How to Take Your Blood Pressure You can take your blood pressure at home with a machine. You may need to check your blood pressure at home:  To check if you have high blood pressure (hypertension).  To check your blood pressure over time.  To make sure your blood pressure medicine is working.  Supplies needed: You will need a blood pressure machine, or monitor. You can buy one at a drugstore or online. When choosing one:  Choose one with an arm cuff.  Choose one that wraps around your upper arm. Only one finger should fit between your arm and the cuff.  Do not choose one that measures your blood pressure from your wrist or finger.  Your doctor can suggest a monitor. How to prepare Avoid  these things for 30 minutes before checking your blood pressure:  Drinking caffeine.  Drinking alcohol.  Eating.  Smoking.  Exercising.  Five minutes before checking your blood pressure:  Pee.  Sit in a dining chair. Avoid sitting in a soft couch or armchair.  Be quiet. Do not talk.  How to take your blood pressure Follow the instructions that came with your machine. If you have a digital blood  pressure monitor, these may be the instructions: 1. Sit up straight. 2. Place your feet on the floor. Do not cross your ankles or legs. 3. Rest your left arm at the level of your heart. You may rest it on a table, desk, or chair. 4. Pull up your shirt sleeve. 5. Wrap the blood pressure cuff around the upper part of your left arm. The cuff should be 1 inch (2.5 cm) above your elbow. It is best to wrap the cuff around bare skin. 6. Fit the cuff snugly around your arm. You should be able to place only one finger between the cuff and your arm. 7. Put the cord inside the groove of your elbow. 8. Press the power button. 9. Sit quietly while the cuff fills with air and loses air. 10. Write down the numbers on the screen. 11. Wait 2-3 minutes and then repeat steps 1-10.  What do the numbers mean? Two numbers make up your blood pressure. The first number is called systolic pressure. The second is called diastolic pressure. An example of a blood pressure reading is "120 over 80" (or 120/80). If you are an adult and do not have a medical condition, use this guide to find out if your blood pressure is normal: Normal  First number: below 120.  Second number: below 80. Elevated  First number: 120-129.  Second number: below 80. Hypertension stage 1  First number: 130-139.  Second number: 80-89. Hypertension stage 2  First number: 140 or above.  Second number: 16 or above. Your blood pressure is above normal even if only the top or bottom number is above normal. Follow these instructions at home:  Check your blood pressure as often as your doctor tells you to.  Take your monitor to your next doctor's appointment. Your doctor will: ? Make sure you are using it correctly. ? Make sure it is working right.  Make sure you understand what your blood pressure numbers should be.  Tell your doctor if your medicines are causing side effects. Contact a doctor if:  Your blood pressure keeps  being high. Get help right away if:  Your first blood pressure number is higher than 180.  Your second blood pressure number is higher than 120. This information is not intended to replace advice given to you by your health care provider. Make sure you discuss any questions you have with your health care provider. Document Released: 07/23/2008 Document Revised: 07/08/2016 Document Reviewed: 01/17/2016 Elsevier Interactive Patient Education  2018 [employer].   IF you received an x-ray today, you will receive an invoice from [facility]. Please contact [facility] at [phone] with questions or concerns regarding your invoice.   IF you received labwork today, you will receive an invoice from [place]. Please contact [facility] at [phone] with questions or concerns regarding your invoice.   Our billing staff will not be able to assist you with questions regarding bills from these companies.  You will be contacted with the lab results as soon as they are available. The fastest way to get  your results is to activate your My Chart account. Instructions are located on the last page of this paperwork. If you have not heard from [place] regarding the results in 2 weeks, please contact this office.       I personally performed the services described in this documentation, which was scribed in my presence. The recorded information has been reviewed and considered for accuracy and completeness, addended by me as needed, and agree with information above.  Signed,   Merri Ray, MD Primary Care at [place].  03/17/18 9:30 AM

## 2018-03-17 NOTE — Patient Instructions (Addendum)
Based on home blood pressures, I will not change meds today. Make sure you check it the correct way as listed below. If those readings are over 140/90 - return to discuss med changes. As long as blood counts are improving, can follow up for repeat testing in 1 month.    How to Take Your Blood Pressure You can take your blood pressure at home with a machine. You may need to check your blood pressure at home:  To check if you have high blood pressure (hypertension).  To check your blood pressure over time.  To make sure your blood pressure medicine is working.  Supplies needed: You will need a blood pressure machine, or monitor. You can buy one at a drugstore or online. When choosing one:  Choose one with an arm cuff.  Choose one that wraps around your upper arm. Only one finger should fit between your arm and the cuff.  Do not choose one that measures your blood pressure from your wrist or finger.  Your doctor can suggest a monitor. How to prepare Avoid these things for 30 minutes before checking your blood pressure:  Drinking caffeine.  Drinking alcohol.  Eating.  Smoking.  Exercising.  Five minutes before checking your blood pressure:  Pee.  Sit in a dining chair. Avoid sitting in a soft couch or armchair.  Be quiet. Do not talk.  How to take your blood pressure Follow the instructions that came with your machine. If you have a digital blood pressure monitor, these may be the instructions: 1. Sit up straight. 2. Place your feet on the floor. Do not cross your ankles or legs. 3. Rest your left arm at the level of your heart. You may rest it on a table, desk, or chair. 4. Pull up your shirt sleeve. 5. Wrap the blood pressure cuff around the upper part of your left arm. The cuff should be 1 inch (2.5 cm) above your elbow. It is best to wrap the cuff around bare skin. 6. Fit the cuff snugly around your arm. You should be able to place only one finger between the cuff  and your arm. 7. Put the cord inside the groove of your elbow. 8. Press the power button. 9. Sit quietly while the cuff fills with air and loses air. 10. Write down the numbers on the screen. 11. Wait 2-3 minutes and then repeat steps 1-10.  What do the numbers mean? Two numbers make up your blood pressure. The first number is called systolic pressure. The second is called diastolic pressure. An example of a blood pressure reading is "120 over 80" (or 120/80). If you are an adult and do not have a medical condition, use this guide to find out if your blood pressure is normal: Normal  First number: below 120.  Second number: below 80. Elevated  First number: 120-129.  Second number: below 80. Hypertension stage 1  First number: 130-139.  Second number: 80-89. Hypertension stage 2  First number: 140 or above.  Second number: 56 or above. Your blood pressure is above normal even if only the top or bottom number is above normal. Follow these instructions at home:  Check your blood pressure as often as your doctor tells you to.  Take your monitor to your next doctor's appointment. Your doctor will: ? Make sure you are using it correctly. ? Make sure it is working right.  Make sure you understand what your blood pressure numbers should be.  Tell your  doctor if your medicines are causing side effects. Contact a doctor if:  Your blood pressure keeps being high. Get help right away if:  Your first blood pressure number is higher than 180.  Your second blood pressure number is higher than 120. This information is not intended to replace advice given to you by your health care provider. Make sure you discuss any questions you have with your health care provider. Document Released: 07/23/2008 Document Revised: 07/08/2016 Document Reviewed: 01/17/2016 Elsevier Interactive Patient Education  2018 [employer].   IF you received an x-ray today, you will receive an invoice  from [facility]. Please contact [facility] at [phone] with questions or concerns regarding your invoice.   IF you received labwork today, you will receive an invoice from [place]. Please contact [facility] at [phone] with questions or concerns regarding your invoice.   Our billing staff will not be able to assist you with questions regarding bills from these companies.  You will be contacted with the lab results as soon as they are available. The fastest way to get your results is to activate your My Chart account. Instructions are located on the last page of this paperwork. If you have not heard from [place] regarding the results in 2 weeks, please contact this office.

## 2018-03-21 ENCOUNTER — Telehealth: Payer: Self-pay | Admitting: Family Medicine

## 2018-03-21 NOTE — Telephone Encounter (Signed)
Copied from [place] [phone] [id number]. Topic: General - Other >> Mar 21, 2018  3:29 PM Nils Flack wrote: Reason for CRM: pt would like to have call back about labs, please call [phone]

## 2018-03-22 ENCOUNTER — Telehealth: Payer: Self-pay | Admitting: Family Medicine

## 2018-03-22 NOTE — Telephone Encounter (Signed)
Copied from [place] [id number]. Topic: Quick Communication - Other Results >> Mar 22, 2018  5:11 PM Cecelia Byars, NT wrote: Patient called and a would like a return call with results from visit on 03/17/18 please call  [phone]

## 2018-03-23 ENCOUNTER — Telehealth: Payer: Self-pay | Admitting: Family Medicine

## 2018-03-23 NOTE — Telephone Encounter (Signed)
Per not in lab results, all results were reviewed with the pt during office visit

## 2018-03-23 NOTE — Telephone Encounter (Signed)
Results were reviewed at the time of visit

## 2018-03-23 NOTE — Telephone Encounter (Unsigned)
Copied from [place] [id number]. Topic: Quick Communication - Other Results >> Mar 22, 2018  5:11 PM Cecelia Byars, NT wrote: Patient called and a would like a return call with results from visit on 03/17/18 please call  [phone]

## 2018-03-24 NOTE — Telephone Encounter (Signed)
Patient understand labs have not been released

## 2018-03-27 ENCOUNTER — Encounter: Payer: Self-pay | Admitting: Family Medicine

## 2018-04-08 ENCOUNTER — Other Ambulatory Visit: Payer: Self-pay | Admitting: Family Medicine

## 2018-04-22 ENCOUNTER — Ambulatory Visit: Payer: 59 | Admitting: Family Medicine

## 2018-05-26 ENCOUNTER — Other Ambulatory Visit: Payer: Self-pay

## 2018-05-26 ENCOUNTER — Encounter
Admission: RE | Admit: 2018-05-26 | Discharge: 2018-05-26 | Disposition: A | Discharge status: Home / Self Care | Payer: 59 | Source: Ambulatory Visit | Attending: Orthopedic Surgery | Admitting: Orthopedic Surgery

## 2018-05-26 DIAGNOSIS — I4510 Unspecified right bundle-branch block: Secondary | ICD-10-CM | POA: Insufficient documentation

## 2018-05-26 DIAGNOSIS — R001 Bradycardia, unspecified: Secondary | ICD-10-CM | POA: Insufficient documentation

## 2018-05-26 DIAGNOSIS — Z01818 Encounter for other preprocedural examination: Secondary | ICD-10-CM | POA: Insufficient documentation

## 2018-05-26 DIAGNOSIS — I10 Essential (primary) hypertension: Secondary | ICD-10-CM | POA: Insufficient documentation

## 2018-05-26 DIAGNOSIS — M7022 Olecranon bursitis, left elbow: Secondary | ICD-10-CM | POA: Diagnosis not present

## 2018-05-26 HISTORY — DX: Personal history of urinary calculi: Z87.442

## 2018-05-26 LAB — CBC
HEMATOCRIT: 37.3 % — AB (ref 40.0–52.0)
Hemoglobin: 13.6 g/dL (ref 13.0–18.0)
MCH: 29.7 pg (ref 26.0–34.0)
MCHC: 36.4 g/dL — AB (ref 32.0–36.0)
MCV: 81.6 fL (ref 80.0–100.0)
Platelets: 170 10*3/uL (ref 150–440)
RBC: 4.57 MIL/uL (ref 4.40–5.90)
RDW: 15.1 % — ABNORMAL HIGH (ref 11.5–14.5)
WBC: 7.3 10*3/uL (ref 3.8–10.6)

## 2018-05-26 NOTE — Patient Instructions (Signed)
Your procedure is scheduled on: Thursday 06/02/18 Report to [place]. To find out your arrival time please call [phone] between 1PM - 3PM on Wednesday 06/01/18.  Remember: Instructions that are not followed completely may result in serious medical risk, up to and including death, or upon the discretion of your surgeon and anesthesiologist your surgery may need to be rescheduled.     _X__ 1. Do not eat food after midnight the night before your procedure.                 No gum chewing or hard candies. You may drink clear liquids up to 2 hours                 before you are scheduled to arrive for your surgery- DO not drink clear                 liquids within 2 hours of the start of your surgery.                 Clear Liquids include:  water, apple juice without pulp, clear carbohydrate                 drink such as Clearfast or Gatorade, Black Coffee or Tea (Do not add                 anything to coffee or tea).  __X__2.  On the morning of surgery brush your teeth with toothpaste and water, you                 may rinse your mouth with mouthwash if you wish.  Do not swallow any              toothpaste of mouthwash.     _X__ 3.  No Alcohol for 24 hours before or after surgery.   _X__ 4.  Do Not Smoke or use e-cigarettes For 24 Hours Prior to Your Surgery.                 Do not use any chewable tobacco products for at least 6 hours prior to                 surgery.  ____  5.  Bring all medications with you on the day of surgery if instructed.   __X__  6.  Notify your doctor if there is any change in your medical condition      (cold, fever, infections).     Do not wear jewelry, make-up, hairpins, clips or nail polish. Do not wear lotions, powders, or perfumes.  Do not shave 48 hours prior to surgery. Men may shave face and neck. Do not bring valuables to the hospital.    [facility] is not responsible for any belongings or  valuables.  Contacts, dentures/partials or body piercings may not be worn into surgery. Bring a case for your contacts, glasses or hearing aids, a denture cup will be supplied. Leave your suitcase in the car. After surgery it may be brought to your room. For patients admitted to the hospital, discharge time is determined by your treatment team.   Patients discharged the day of surgery will not be allowed to drive home.   Please read over the following fact sheets that you were given:   MRSA Information  __X__ Take these medicines the morning of surgery with A SIP OF WATER:  1. cloNIDine (CATAPRES)   2. diltiazem (CARDIZEM CD)   3. loratadine (CLARITIN) if needed  4. ALPRAZolam Duanne Moron) if needed  5.  6.  ____ Fleet Enema (as directed)   __X__ Use CHG Soap/SAGE wipes as directed  ____ Use inhalers on the day of surgery  ____ Stop metformin/Janumet/Farxiga 2 days prior to surgery    ____ Take 1/2 of usual insulin dose the night before surgery. No insulin the morning          of surgery.   ____ Stop Blood Thinners Coumadin/Plavix/Xarelto/Pleta/Pradaxa/Eliquis/Effient/Aspirin  on   Or contact your Surgeon, Cardiologist or Medical Doctor regarding  ability to stop your blood thinners  __X__ Stop Anti-inflammatories 7 days before surgery such as Advil, Ibuprofen, Motrin,  BC or Goodies Powder, Naprosyn, Naproxen, Aleve, Aspirin    __X__ Stop all herbal supplements, fish oil or vitamin E until after surgery.    ____ Bring C-Pap to the hospital.

## 2018-05-26 NOTE — Pre-Procedure Instructions (Signed)
EKG reviewed by Dr. Karenz. No new orders. 

## 2018-06-02 ENCOUNTER — Ambulatory Visit
Admission: RE | Admit: 2018-06-02 | Discharge: 2018-06-02 | Disposition: A | Discharge status: Home / Self Care | Payer: 59 | Source: Ambulatory Visit | Attending: Orthopedic Surgery | Admitting: Orthopedic Surgery

## 2018-06-02 ENCOUNTER — Other Ambulatory Visit: Payer: Self-pay

## 2018-06-02 ENCOUNTER — Encounter
Admission: RE | Disposition: A | Discharge status: Home / Self Care | Payer: Self-pay | Source: Ambulatory Visit | Attending: Orthopedic Surgery

## 2018-06-02 ENCOUNTER — Ambulatory Visit: Payer: 59 | Admitting: Anesthesiology

## 2018-06-02 ENCOUNTER — Encounter: Payer: Self-pay | Admitting: *Deleted

## 2018-06-02 DIAGNOSIS — M7022 Olecranon bursitis, left elbow: Secondary | ICD-10-CM | POA: Diagnosis not present

## 2018-06-02 DIAGNOSIS — Z8546 Personal history of malignant neoplasm of prostate: Secondary | ICD-10-CM | POA: Insufficient documentation

## 2018-06-02 DIAGNOSIS — Z7982 Long term (current) use of aspirin: Secondary | ICD-10-CM | POA: Diagnosis not present

## 2018-06-02 DIAGNOSIS — Z79899 Other long term (current) drug therapy: Secondary | ICD-10-CM | POA: Insufficient documentation

## 2018-06-02 DIAGNOSIS — I10 Essential (primary) hypertension: Secondary | ICD-10-CM | POA: Diagnosis not present

## 2018-06-02 DIAGNOSIS — Z9989 Dependence on other enabling machines and devices: Secondary | ICD-10-CM | POA: Insufficient documentation

## 2018-06-02 DIAGNOSIS — G4733 Obstructive sleep apnea (adult) (pediatric): Secondary | ICD-10-CM | POA: Insufficient documentation

## 2018-06-02 HISTORY — PX: OLECRANON BURSECTOMY: SHX2097

## 2018-06-02 SURGERY — BURSECTOMY, ELBOW
Anesthesia: General | Laterality: Left

## 2018-06-02 MED ORDER — ONDANSETRON HCL 4 MG PO TABS: 4.0000 mg | ORAL_TABLET | Freq: Four times a day (QID) | ORAL | Status: DC | PRN

## 2018-06-02 MED ORDER — FAMOTIDINE 20 MG PO TABS
ORAL_TABLET | ORAL | Status: AC
  Administered 2018-06-02: 20 mg via ORAL
  Filled 2018-06-02: qty 1

## 2018-06-02 MED ORDER — OXYCODONE HCL 5 MG/5ML PO SOLN: 5.0000 mg | Freq: Once | ORAL | Status: DC | PRN

## 2018-06-02 MED ORDER — DEXAMETHASONE SODIUM PHOSPHATE 10 MG/ML IJ SOLN
INTRAMUSCULAR | Status: DC | PRN
  Administered 2018-06-02: 5 mg via INTRAVENOUS

## 2018-06-02 MED ORDER — PROPOFOL 10 MG/ML IV BOLUS
INTRAVENOUS | Status: DC | PRN
  Administered 2018-06-02: 150 mg via INTRAVENOUS

## 2018-06-02 MED ORDER — HYDROCODONE-ACETAMINOPHEN 5-325 MG PO TABS: 1.0000 | ORAL_TABLET | ORAL | Status: DC | PRN

## 2018-06-02 MED ORDER — FAMOTIDINE 20 MG PO TABS
20.0000 mg | ORAL_TABLET | Freq: Once | ORAL | Status: AC
  Administered 2018-06-02: 20 mg via ORAL

## 2018-06-02 MED ORDER — FENTANYL CITRATE (PF) 100 MCG/2ML IJ SOLN
INTRAMUSCULAR | Status: AC
  Filled 2018-06-02: qty 2

## 2018-06-02 MED ORDER — ONDANSETRON HCL 4 MG/2ML IJ SOLN: 4.0000 mg | Freq: Four times a day (QID) | INTRAMUSCULAR | Status: DC | PRN

## 2018-06-02 MED ORDER — METOCLOPRAMIDE HCL 5 MG/ML IJ SOLN: 5.0000 mg | Freq: Three times a day (TID) | INTRAMUSCULAR | Status: DC | PRN

## 2018-06-02 MED ORDER — SODIUM CHLORIDE 0.9 % IV SOLN: INTRAVENOUS | Status: DC

## 2018-06-02 MED ORDER — OXYCODONE HCL 5 MG PO TABS: 5.0000 mg | ORAL_TABLET | Freq: Once | ORAL | Status: DC | PRN

## 2018-06-02 MED ORDER — METOCLOPRAMIDE HCL 10 MG PO TABS: 5.0000 mg | ORAL_TABLET | Freq: Three times a day (TID) | ORAL | Status: DC | PRN

## 2018-06-02 MED ORDER — ONDANSETRON HCL 4 MG/2ML IJ SOLN
INTRAMUSCULAR | Status: DC | PRN
  Administered 2018-06-02: 4 mg via INTRAVENOUS

## 2018-06-02 MED ORDER — PROPOFOL 10 MG/ML IV BOLUS
INTRAVENOUS | Status: AC
  Filled 2018-06-02: qty 20

## 2018-06-02 MED ORDER — ONDANSETRON HCL 4 MG/2ML IJ SOLN
INTRAMUSCULAR | Status: AC
  Filled 2018-06-02: qty 2

## 2018-06-02 MED ORDER — CEFAZOLIN SODIUM-DEXTROSE 2-4 GM/100ML-% IV SOLN
INTRAVENOUS | Status: AC
  Filled 2018-06-02: qty 100

## 2018-06-02 MED ORDER — FENTANYL CITRATE (PF) 100 MCG/2ML IJ SOLN: 25.0000 ug | INTRAMUSCULAR | Status: DC | PRN

## 2018-06-02 MED ORDER — MIDAZOLAM HCL 2 MG/2ML IJ SOLN
INTRAMUSCULAR | Status: AC
  Filled 2018-06-02: qty 2

## 2018-06-02 MED ORDER — FENTANYL CITRATE (PF) 100 MCG/2ML IJ SOLN
INTRAMUSCULAR | Status: DC | PRN
  Administered 2018-06-02 (×2): 50 ug via INTRAVENOUS

## 2018-06-02 MED ORDER — CEFAZOLIN SODIUM-DEXTROSE 2-4 GM/100ML-% IV SOLN
2.0000 g | Freq: Once | INTRAVENOUS | Status: AC
  Administered 2018-06-02: 2 g via INTRAVENOUS

## 2018-06-02 MED ORDER — ACETAMINOPHEN 10 MG/ML IV SOLN
INTRAVENOUS | Status: AC
  Filled 2018-06-02: qty 100

## 2018-06-02 MED ORDER — HYDROCODONE-ACETAMINOPHEN 5-325 MG PO TABS: 1.0000 | ORAL_TABLET | Freq: Four times a day (QID) | ORAL | 0 refills | Status: DC | PRN

## 2018-06-02 MED ORDER — LACTATED RINGERS IV SOLN
INTRAVENOUS | Status: DC
  Administered 2018-06-02: 12:00:00 via INTRAVENOUS

## 2018-06-02 MED ORDER — LIDOCAINE HCL (CARDIAC) PF 100 MG/5ML IV SOSY
PREFILLED_SYRINGE | INTRAVENOUS | Status: DC | PRN
  Administered 2018-06-02: 100 mg via INTRAVENOUS

## 2018-06-02 MED ORDER — DEXAMETHASONE SODIUM PHOSPHATE 10 MG/ML IJ SOLN
INTRAMUSCULAR | Status: AC
  Filled 2018-06-02: qty 1

## 2018-06-02 MED ORDER — ACETAMINOPHEN 10 MG/ML IV SOLN
INTRAVENOUS | Status: DC | PRN
  Administered 2018-06-02: 1000 mg via INTRAVENOUS

## 2018-06-02 SURGICAL SUPPLY — 32 items
CANISTER SUCT 1200ML W/VALVE (MISCELLANEOUS) ×3 IMPLANT
CHLORAPREP W/TINT 26ML (MISCELLANEOUS) ×3 IMPLANT
CLOSURE WOUND 1/2 X4 (GAUZE/BANDAGES/DRESSINGS) ×1
COVER WAND RF STERILE (DRAPES) ×3 IMPLANT
CUFF TOURN 18 STER (MISCELLANEOUS) IMPLANT
CUFF TOURN 24 STER (MISCELLANEOUS) IMPLANT
ELECT CAUTERY BLADE 6.4 (BLADE) ×3 IMPLANT
ELECT REM PT RETURN 9FT ADLT (ELECTROSURGICAL) ×3
ELECTRODE REM PT RTRN 9FT ADLT (ELECTROSURGICAL) ×1 IMPLANT
GAUZE PETRO XEROFOAM 1X8 (MISCELLANEOUS) ×3 IMPLANT
GAUZE SPONGE 4X4 12PLY STRL (GAUZE/BANDAGES/DRESSINGS) ×3 IMPLANT
GLOVE SURG SYN 9.0  PF PI (GLOVE) ×2
GLOVE SURG SYN 9.0 PF PI (GLOVE) ×1 IMPLANT
GOWN SRG 2XL LVL 4 RGLN SLV (GOWNS) ×1 IMPLANT
GOWN STRL NON-REIN 2XL LVL4 (GOWNS) ×2
GOWN STRL REUS W/ TWL LRG LVL3 (GOWN DISPOSABLE) ×1 IMPLANT
GOWN STRL REUS W/TWL LRG LVL3 (GOWN DISPOSABLE) ×2
KIT TURNOVER KIT A (KITS) ×3 IMPLANT
NDL FILTER BLUNT 18X1 1/2 (NEEDLE) ×1 IMPLANT
NEEDLE FILTER BLUNT 18X 1/2SAF (NEEDLE) ×2
NEEDLE FILTER BLUNT 18X1 1/2 (NEEDLE) ×1 IMPLANT
NS IRRIG 500ML POUR BTL (IV SOLUTION) ×3 IMPLANT
PACK EXTREMITY ARMC (MISCELLANEOUS) ×3 IMPLANT
PAD ABD DERMACEA PRESS 5X9 (GAUZE/BANDAGES/DRESSINGS) ×3 IMPLANT
PAD CAST CTTN 4X4 STRL (SOFTGOODS) ×2 IMPLANT
PADDING CAST COTTON 4X4 STRL (SOFTGOODS) ×4
SCALPEL PROTECTED #15 DISP (BLADE) ×6 IMPLANT
SPONGE LAP 18X18 RF (DISPOSABLE) ×3 IMPLANT
STRIP CLOSURE SKIN 1/2X4 (GAUZE/BANDAGES/DRESSINGS) ×2 IMPLANT
SUT VIC AB 2-0 SH 27 (SUTURE) ×2
SUT VIC AB 2-0 SH 27XBRD (SUTURE) ×1 IMPLANT
SUT VIC AB 3-0 PS2 18 (SUTURE) ×3 IMPLANT

## 2018-06-02 NOTE — Anesthesia Procedure Notes (Signed)
Procedure Name: LMA Insertion Date/Time: 06/02/2018 12:10 PM Performed by: Chanetta Marshall, CRNA Pre-anesthesia Checklist: Patient identified, Emergency Drugs available, Suction available and Patient being monitored Patient Re-evaluated:Patient Re-evaluated prior to induction Oxygen Delivery Method: Circle system utilized Preoxygenation: Pre-oxygenation with 100% oxygen Induction Type: IV induction Ventilation: Mask ventilation without difficulty LMA: LMA inserted LMA Size: 4.0 Number of attempts: 1 Placement Confirmation: positive ETCO2 and CO2 detector Tube secured with: Tape Dental Injury: Teeth and Oropharynx as per pre-operative assessment

## 2018-06-02 NOTE — Anesthesia Preprocedure Evaluation (Signed)
Anesthesia Evaluation  Patient identified by MRN, date of birth, ID band Patient awake    Reviewed: Allergy & Precautions, H&P , NPO status , Patient's Chart, lab work & pertinent test results  History of Anesthesia Complications Negative for: history of anesthetic complications  Airway Mallampati: III  TM Distance: >3 FB Neck ROM: full    Dental  (+) Chipped, Poor Dentition, Missing   Pulmonary neg shortness of breath, sleep apnea and Continuous Positive Airway Pressure Ventilation ,           Cardiovascular Exercise Tolerance: Good hypertension, (-) angina(-) Past MI and (-) DOE      Neuro/Psych negative neurological ROS  negative psych ROS   GI/Hepatic negative GI ROS, Neg liver ROS, neg GERD  ,  Endo/Other  negative endocrine ROS  Renal/GU      Musculoskeletal   Abdominal   Peds  Hematology negative hematology ROS (+)   Anesthesia Other Findings Past Medical History: No date: Allergic rhinitis, cause unspecified No date: Allergy No date: Chicken pox     Comment:  childhood No date: Essential hypertension, benign No date: Hematuria, unspecified No date: History of kidney stones No date: Impotence of organic origin No date: Measles     Comment:  childhood No date: Mumps     Comment:  childhood No date: Nonspecific abnormal electrocardiogram (ECG) (EKG) No date: Obstructive sleep apnea (adult) (pediatric)     Comment:  CPAP No date: Other abnormal glucose No date: Other B-complex deficiencies No date: Personal history of malignant neoplasm of prostate No date: Personal history of urinary calculi 2005: Prostate cancer ([place])     Comment:  Prostatectomy No date: Pure hypercholesterolemia No date: Unspecified urinary incontinence No date: Unspecified vitamin D deficiency  Past Surgical History: 01/22/2013: COLONOSCOPY     Comment:  normal.  [facility].  Repeat 5 years. No date: ELBOW SURGERY 2005:  PROSTATECTOMY 07/2009: urological surgery  BMI    Body Mass Index:  29.98 kg/m      Reproductive/Obstetrics negative OB ROS                             Anesthesia Physical Anesthesia Plan  ASA: III  Anesthesia Plan: General LMA   Post-op Pain Management:    Induction: Intravenous  PONV Risk Score and Plan: Ondansetron, Dexamethasone, Midazolam and Treatment may vary due to age or medical condition  Airway Management Planned: LMA  Additional Equipment:   Intra-op Plan:   Post-operative Plan: Extubation in OR  Informed Consent: I have reviewed the patients History and Physical, chart, labs and discussed the procedure including the risks, benefits and alternatives for the proposed anesthesia with the patient or authorized representative who has indicated his/her understanding and acceptance.   Dental Advisory Given  Plan Discussed with: Anesthesiologist, CRNA and Surgeon  Anesthesia Plan Comments: (Patient consented for risks of anesthesia including but not limited to:  - adverse reactions to medications - damage to teeth, lips or other oral mucosa - sore throat or hoarseness - Damage to heart, brain, lungs or loss of life  Patient voiced understanding.)        Anesthesia Quick Evaluation

## 2018-06-02 NOTE — OR Nursing (Signed)
Discussed discharge instructions with pt and wife. bothg voice understanding.

## 2018-06-02 NOTE — Anesthesia Post-op Follow-up Note (Signed)
Anesthesia QCDR form completed.        

## 2018-06-02 NOTE — Op Note (Signed)
06/02/2018  12:59 PM  PATIENT:  Juan Chavez  67 y.o. male  PRE-OPERATIVE DIAGNOSIS:  OLECRANON BURSITIS LEFT ELBOW   POST-OPERATIVE DIAGNOSIS:  OLECRANON BURSITIS LEFT ELBOW   PROCEDURE:  Procedure(s): OLECRANON BURSA (Left)  SURGEON: Laurene Footman, MD  ASSISTANTS: None  ANESTHESIA:   general  EBL:  Total I/O In: 600 [I.V.:600] Out: 10 [Blood:10]  BLOOD ADMINISTERED:none  DRAINS: none   LOCAL MEDICATIONS USED:  MARCAINE     SPECIMEN:  Source of Specimen:  Olecranon bursa  DISPOSITION OF SPECIMEN:  PATHOLOGY  COUNTS:  YES  TOURNIQUET: 21 minutes at 250 mmHg  IMPLANTS: None  DICTATION: .Dragon Dictation patient was brought to the operating room and after adequate general anesthesia was obtained the left arm was prepped and draped you sterile fashion.  After patient identification and timeout procedures were completed, tourniquet was raised.  A curvilinear incision was made on the radial side of the olecranon and the subcutaneous tissue separated from a large mass that was consistent with the bursa.  This was taken out as a single specimen proximally 3 x 3 cm in size.  There was some hemorrhagic material within the bursa with thick granulation tissue but no large fluid collection.  After excision of this the triceps insertion was split to provide exposure of the posterior olecranon spur which was then removed with use of a bone rongeur.  The wound was thoroughly irrigated and then closed with 3-0 Vicryl subcutaneously and 4-0 nylon for the skin with 10 cc of half percent Sensorcaine injected around the incision to aid in postop analgesia.  Xeroform 4 x 4 web roll and Ace wrap applied with tourniquet let down at the close of the case.  PLAN OF CARE: Discharge to home after PACU  PATIENT DISPOSITION:  PACU - hemodynamically stable.

## 2018-06-02 NOTE — H&P (Signed)
Reviewed paper H+P, will be scanned into chart. No changes noted.  

## 2018-06-02 NOTE — Anesthesia Postprocedure Evaluation (Signed)
Anesthesia Post Note  Patient: Aser M Santacroce  Procedure(s) Performed: OLECRANON BURSA (Left )  Patient location during evaluation: PACU Anesthesia Type: General Level of consciousness: awake and alert Pain management: pain level controlled Vital Signs Assessment: post-procedure vital signs reviewed and stable Respiratory status: spontaneous breathing, nonlabored ventilation, respiratory function stable and patient connected to nasal cannula oxygen Cardiovascular status: blood pressure returned to baseline and stable Postop Assessment: no apparent nausea or vomiting Anesthetic complications: no     Last Vitals:  Vitals:   06/02/18 1349 06/02/18 1415  BP: (!) 150/85 (!) 164/85  Pulse: (!) 50 (!) 50  Resp: 14   Temp: (!) 36.2 C   SpO2: 98%     Last Pain:  Vitals:   06/02/18 1349  TempSrc:   PainSc: 0-No pain                 Precious Haws Nakul Avino

## 2018-06-02 NOTE — Progress Notes (Signed)
Blood pressure 158/101   Dr piscitello aware no new orders

## 2018-06-02 NOTE — Discharge Instructions (Addendum)
Keep dressing clean and dry.  Okay to loosen Ace wrap if fingers well.  Pain medicine as directed.    AMBULATORY SURGERY  DISCHARGE INSTRUCTIONS   1) The drugs that you were given will stay in your system until tomorrow so for the next 24 hours you should not:  A) Drive an automobile B) Make any legal decisions C) Drink any alcoholic beverage   2) You may resume regular meals tomorrow.  Today it is better to start with liquids and gradually work up to solid foods.  You may eat anything you prefer, but it is better to start with liquids, then soup and crackers, and gradually work up to solid foods.   3) Please notify your doctor immediately if you have any unusual bleeding, trouble breathing, redness and pain at the surgery site, drainage, fever, or pain not relieved by medication.    4) Additional Instructions:        Please contact your physician with any problems or Same Day Surgery at [phone], Monday through Friday 6 am to 4 pm, or [place] at [facility] number at [phone].

## 2018-06-02 NOTE — Transfer of Care (Signed)
Immediate Anesthesia Transfer of Care Note  Patient: Juan Chavez  Procedure(s) Performed: OLECRANON BURSA (Left )  Patient Location: PACU  Anesthesia Type:General  Level of Consciousness: awake, alert  and oriented  Airway & Oxygen Therapy: Patient Spontanous Breathing and Patient connected to face mask oxygen  Post-op Assessment: Report given to RN and Post -op Vital signs reviewed and stable  Post vital signs: Reviewed and stable  Last Vitals:  Vitals Value Taken Time  BP    Temp    Pulse    Resp    SpO2      Last Pain:  Vitals:   06/02/18 1037  TempSrc: Oral         Complications: No apparent anesthesia complications

## 2018-06-03 ENCOUNTER — Encounter: Payer: Self-pay | Admitting: Orthopedic Surgery

## 2018-06-06 LAB — SURGICAL PATHOLOGY

## 2018-10-12 ENCOUNTER — Telehealth: Payer: Self-pay

## 2018-10-12 NOTE — Telephone Encounter (Signed)
Pt states that Dr. Tamala Julian is his PCP

## 2019-08-09 ENCOUNTER — Ambulatory Visit: Payer: 59 | Admitting: *Deleted

## 2019-08-23 IMAGING — CT CT ABD-PELV W/ CM
1 of 3 series · 13 of 32 positions shown, 18 images · IV contrast (APPLIED)
Comparison: 03/08/2014

CLINICAL DATA: Right pelvic pain for 3-4 months history of
prostatectomy

EXAM:
CT ABDOMEN AND PELVIS WITH CONTRAST
TECHNIQUE: Multidetector CT imaging of the abdomen and pelvis was performed
using the standard protocol following bolus administration of
intravenous contrast.
CONTRAST:  100mL [id number] IOPAMIDOL ([id number]) INJECTION 61%

[Series 2: axial st · axial · 0.77mm/px · z∈[-872,-472]mm · 13 of 91 slices shown, 18 images]
[im 6/91  soft-tissue]
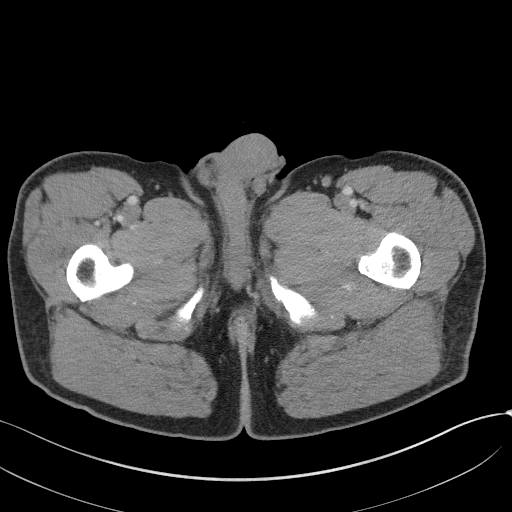
[im 6/91  bone]
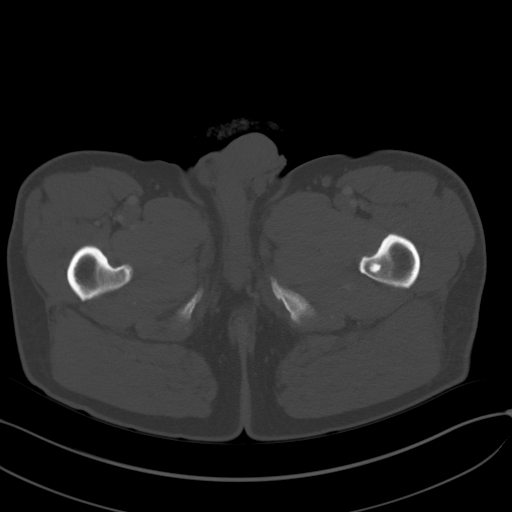
[im 16/91  soft-tissue]
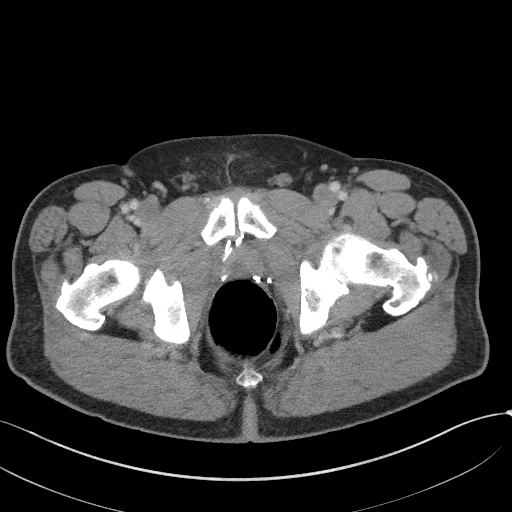
[im 21/91  soft-tissue]
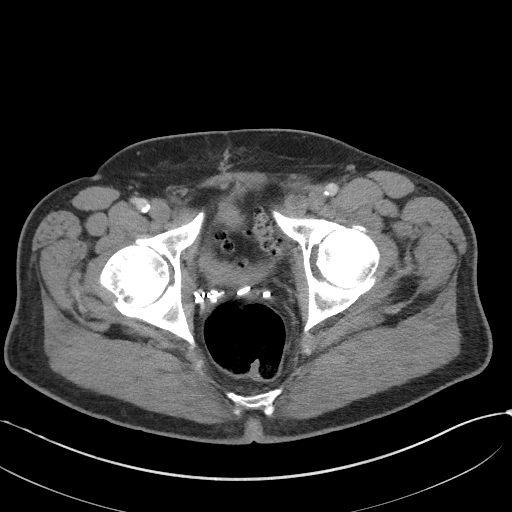
[im 26/91  soft-tissue]
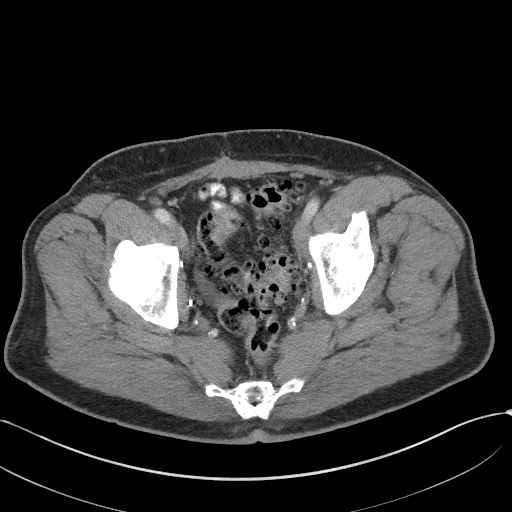
[im 36/91  soft-tissue]
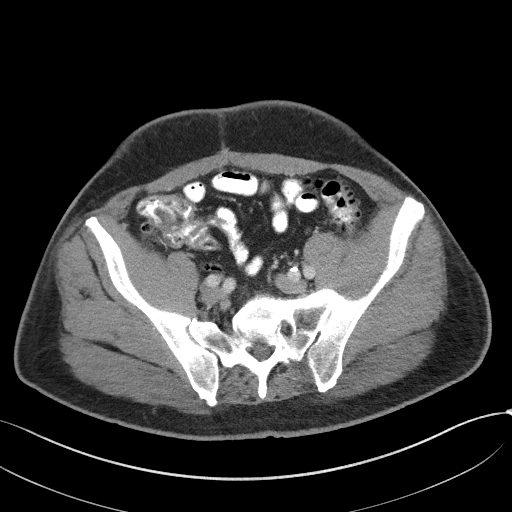
[im 41/91  soft-tissue]
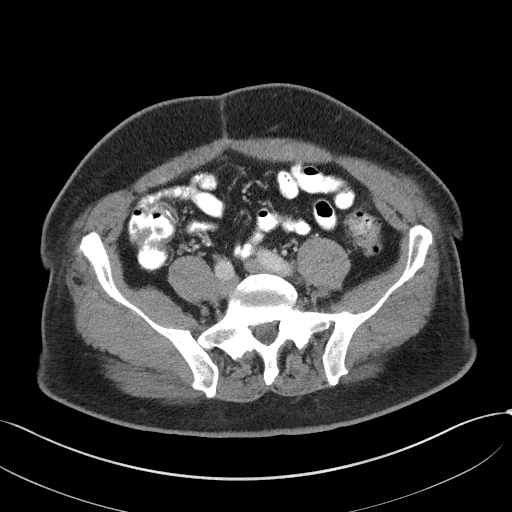
[im 51/91  soft-tissue]
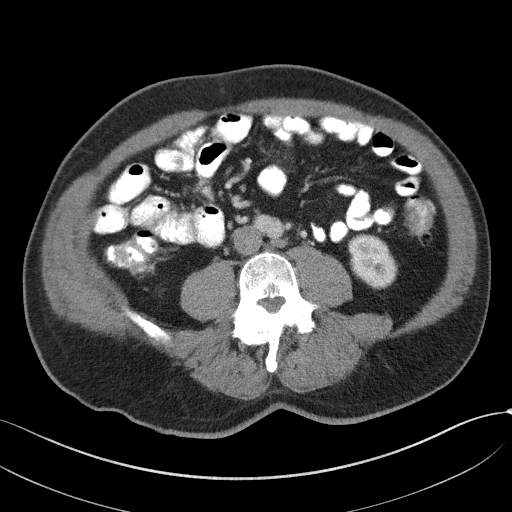
[im 56/91  soft-tissue]
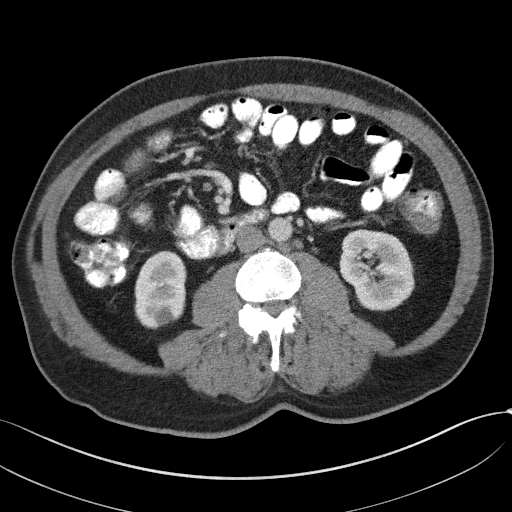
[im 66/91  soft-tissue]
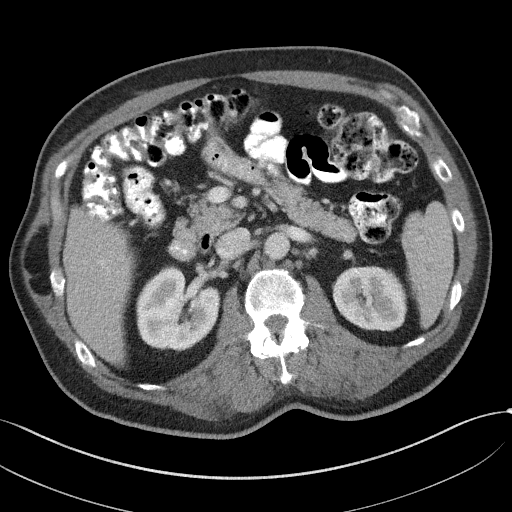
[im 66/91  bone]
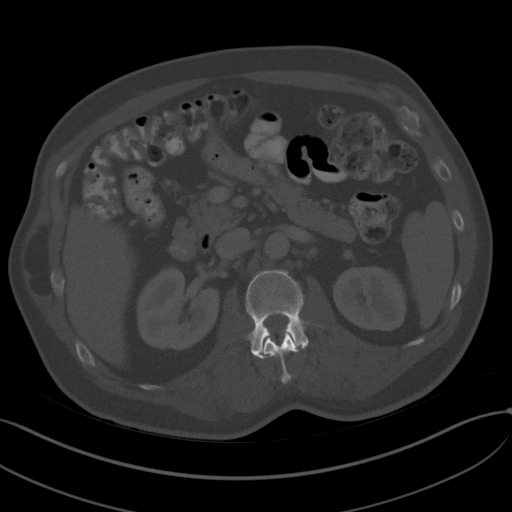
[im 71/91  soft-tissue]
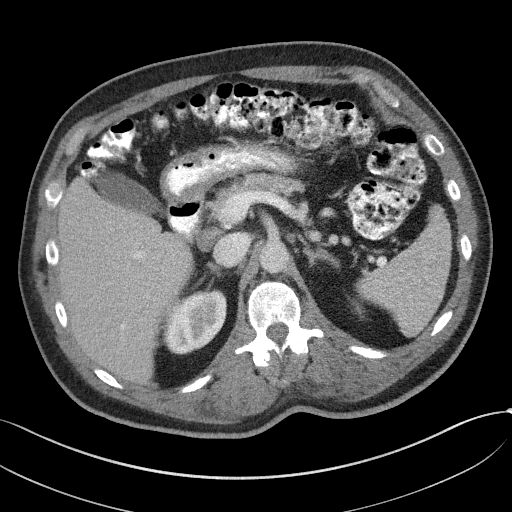
[im 71/91  lung]
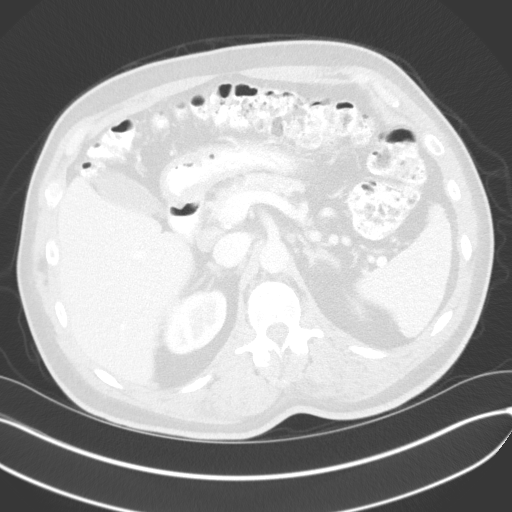
[im 76/91  soft-tissue]
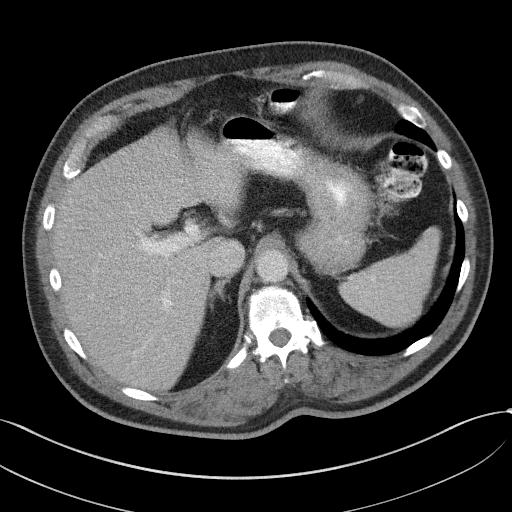
[im 76/91  lung]
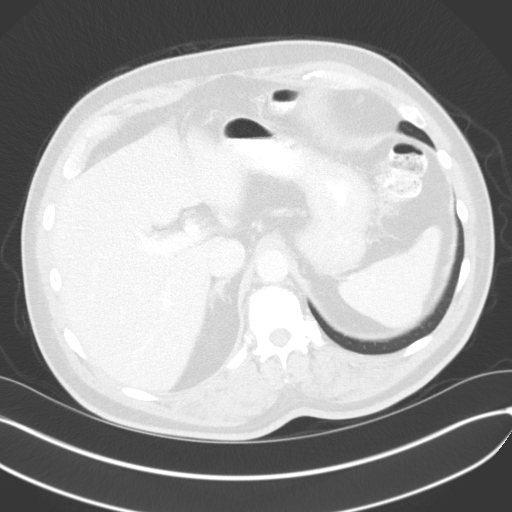
[im 81/91  lung]
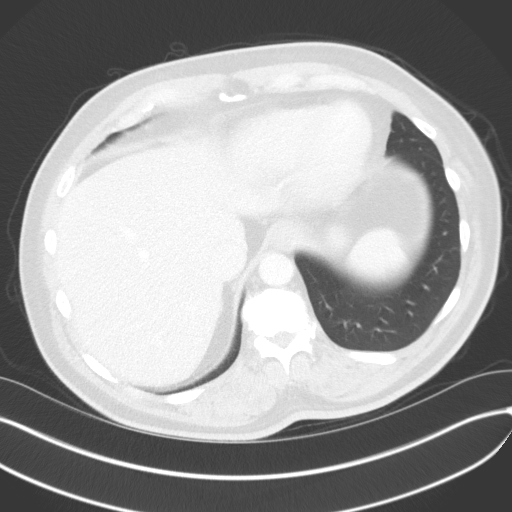
[im 86/91  soft-tissue]
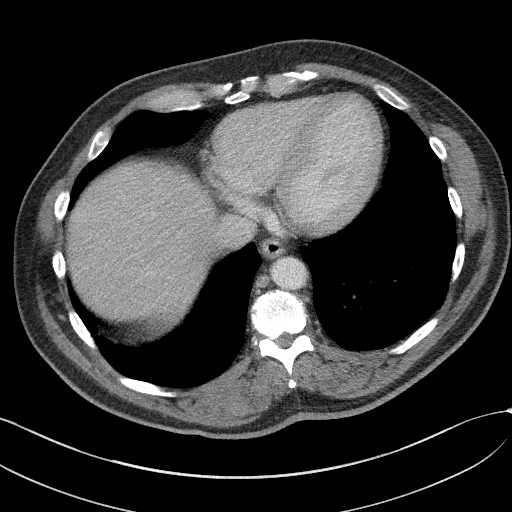
[im 86/91  lung]
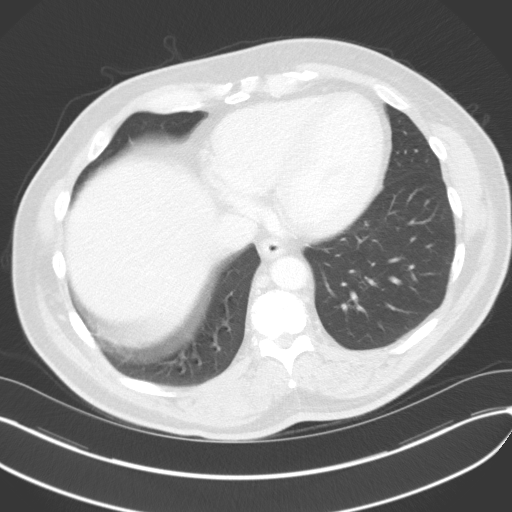

[13 of 32 positions shown; findings below may reference images not displayed]

FINDINGS: Lower chest: Incompletely visualized 18 mm right lower lobe
pulmonary nodule with adjacent linear density. Additional 3 mm left
lower lobe pulmonary nodule. No pleural effusion. Heart size upper
normal. Minimal coronary calcification.

Hepatobiliary: No calcified gallstones or biliary dilatation. No
focal hepatic abnormality

Pancreas: Unremarkable. No pancreatic ductal dilatation or
surrounding inflammatory changes.

Spleen: Multiple subcentimeter low-attenuation foci in the spleen.
Dominant 2.4 cm hypodense indeterminate lesion in the anterior
spleen.

Adrenals/Urinary Tract: Adrenal glands are within normal limits. 14
mm indeterminate lesion lower pole right kidney. Negative for
hydronephrosis. Bladder nearly empty.

Stomach/Bowel: Slightly thickened proximal stomach likely due to
under distension. No dilated small bowel. Normal appendix. Extensive
sigmoid colon diverticular disease without acute inflammation.

Vascular/Lymphatic: Aortic atherosclerosis. Nonspecific
retroperitoneal lymph nodes, measuring up to 1 cm.

Reproductive: Status post prostatectomy.

Other: Negative for free air or free fluid.

Musculoskeletal: 6.2 x 2.2 cm fat density mass/ lipoma in the right
lateral chest wall/abdominal musculature. Nonspecific foci of
sclerosis in the proximal left femur. Degenerative changes of the
spine.
IMPRESSION: 1. No CT evidence for acute intra-abdominal or pelvic abnormality.
2. Multiple hypodense foci in the spleen with dominant 2.4 cm
indeterminate hypodense mass in the spleen. Recommend nonemergent
MRI for further evaluation.
3. Indeterminate 14 mm lesion lower pole right kidney, may also be
evaluated at MRI
4. Partially visualized 18 mm right lower lobe pulmonary nodule.
Recommend dedicated contrast-enhanced CT of the chest for further
evaluation.
5. Sigmoid colon diverticular disease without acute inflammation
These results will be called to the ordering clinician or
representative by the Radiologist Assistant, and communication
documented in the PACS or zVision Dashboard.

## 2019-10-06 IMAGING — CT CT CHEST W/ CM
1 series · 14 of 34 positions shown, 18 images · IV contrast (iopamidol)
Comparison: CT abdomen pelvis dated June 07, 2017.

CLINICAL DATA: Evaluate right lower lobe lung nodule seen on recent
CT abdomen and pelvis. Nonsmoker. No chest complaints.

EXAM:
CT CHEST WITH CONTRAST
TECHNIQUE: Multidetector CT imaging of the chest was performed during
intravenous contrast administration.
CONTRAST:  75mL [id number] IOPAMIDOL ([id number]) INJECTION 61%

[Series 2: axial st · axial · 0.85mm/px · z∈[-618,-338]mm · 14 of 166 slices shown, 18 images]
[im 13/166  mediastinal]
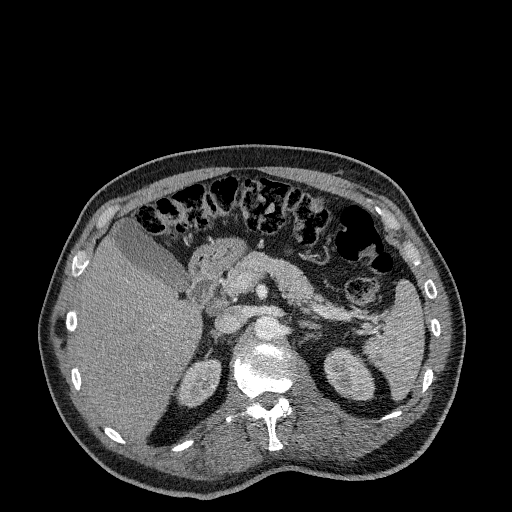
[im 13/166  lung]
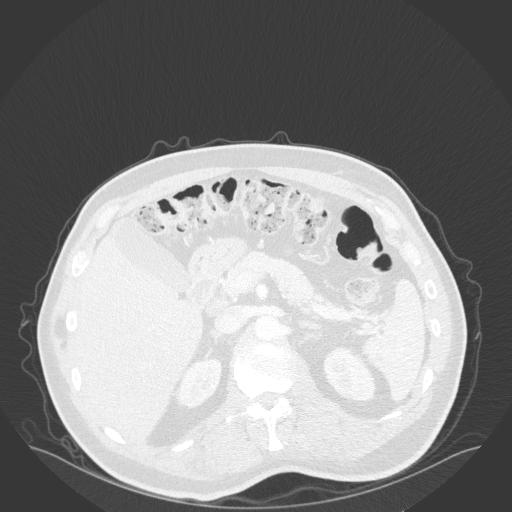
[im 25/166  lung]
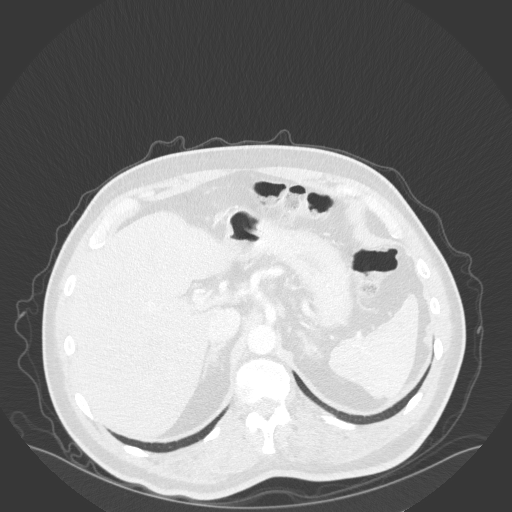
[im 34/166  lung]
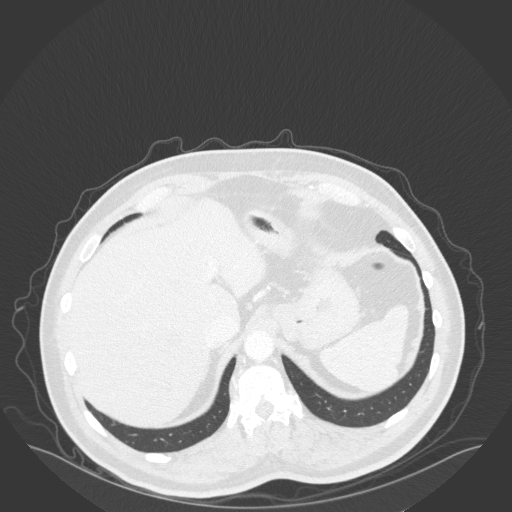
[im 49/166  lung]
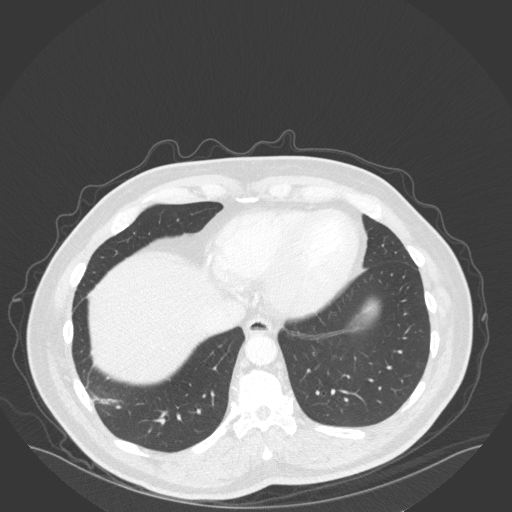
[im 62/166  mediastinal]
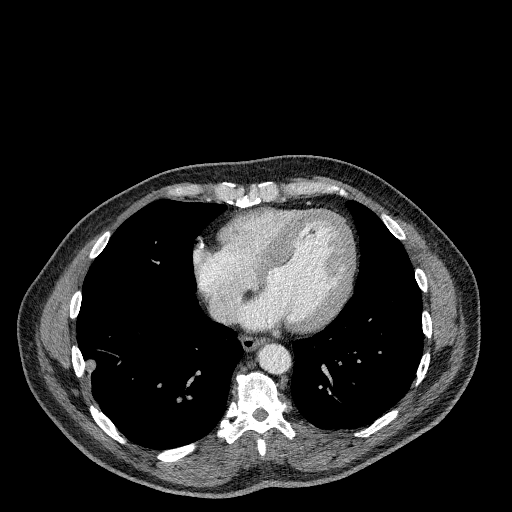
[im 62/166  lung]
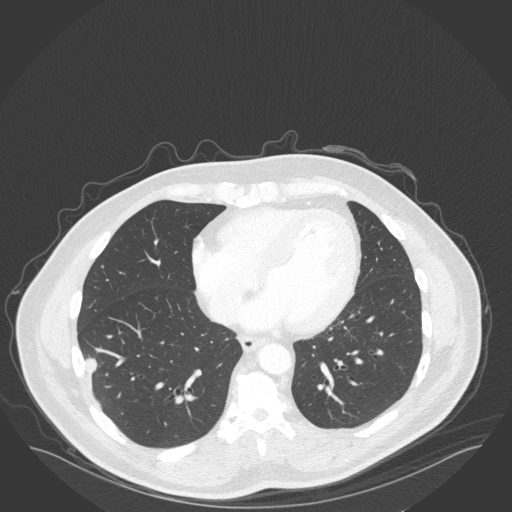
[im 68/166  lung]
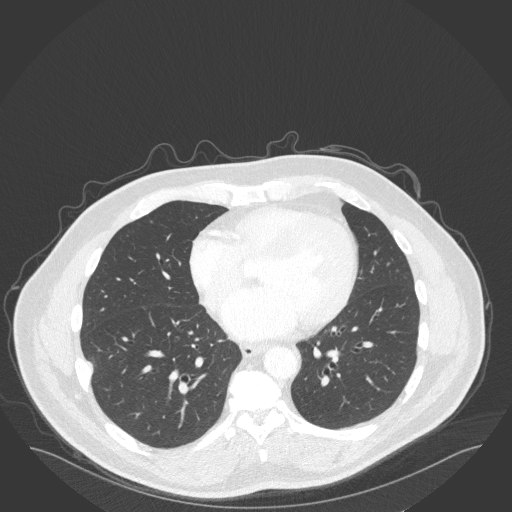
[im 79/166  lung]
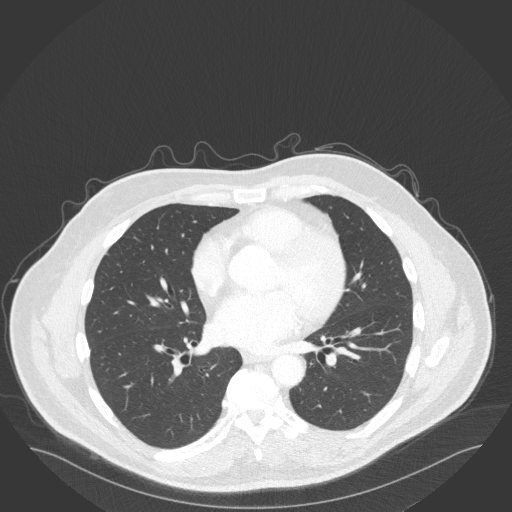
[im 88/166  lung]
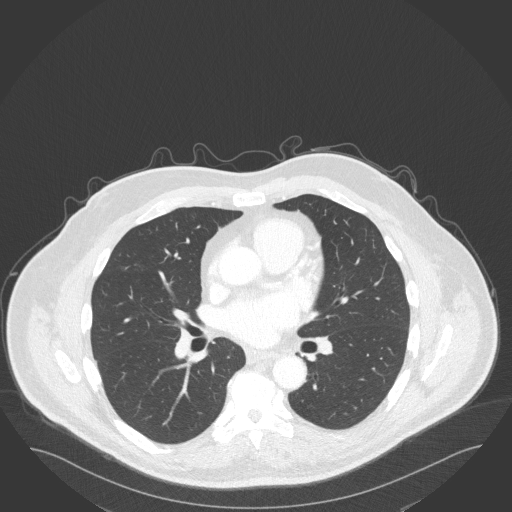
[im 98/166  mediastinal]
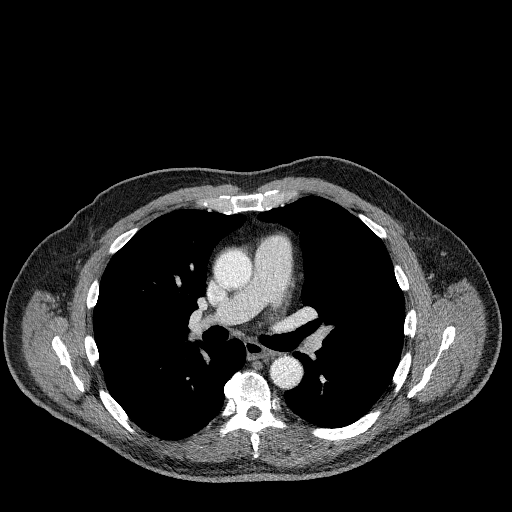
[im 98/166  lung]
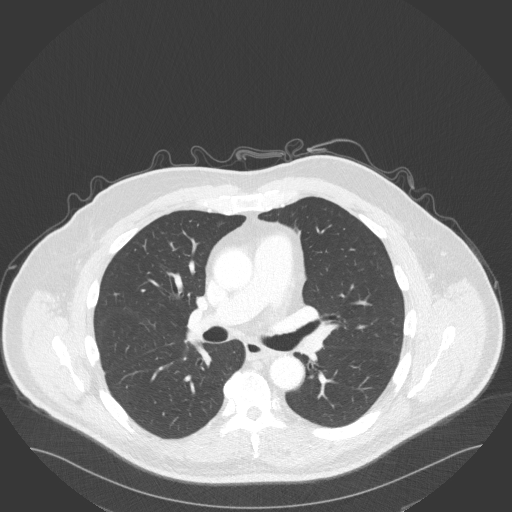
[im 104/166  lung]
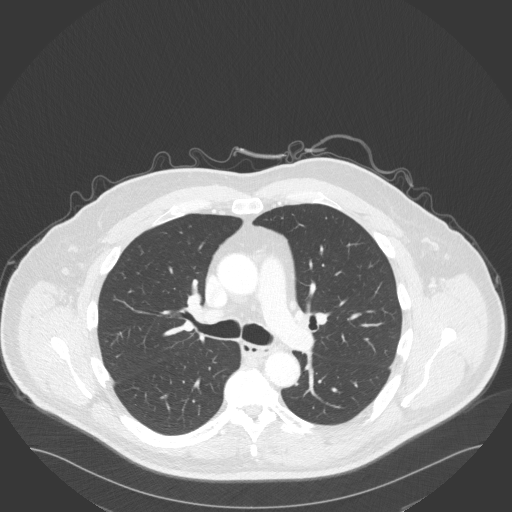
[im 123/166  lung]
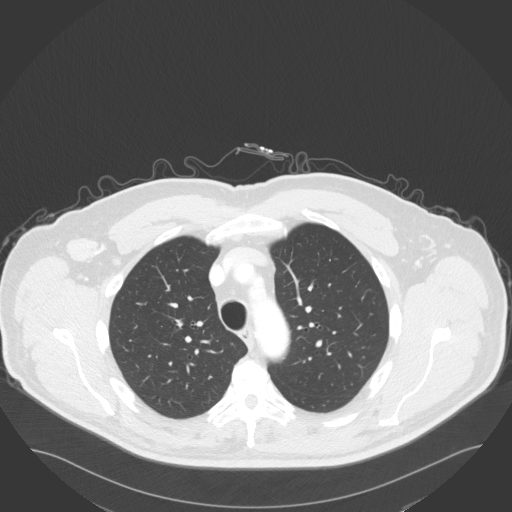
[im 133/166  lung]
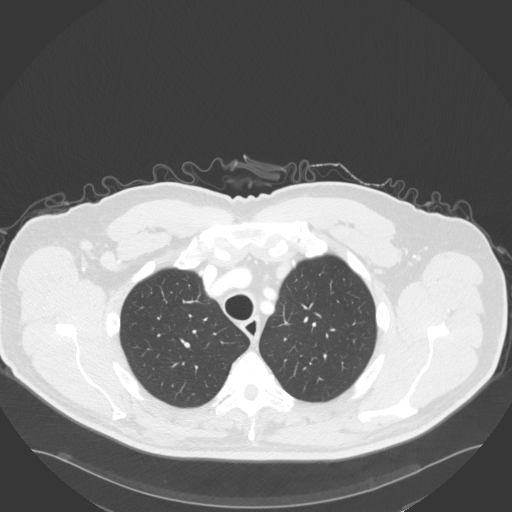
[im 141/166  mediastinal]
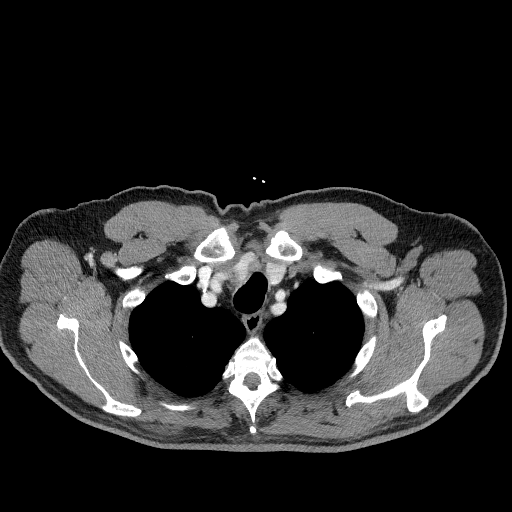
[im 141/166  lung]
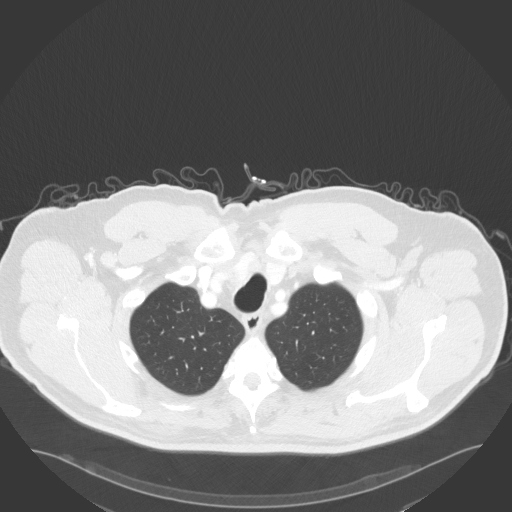
[im 153/166  lung]
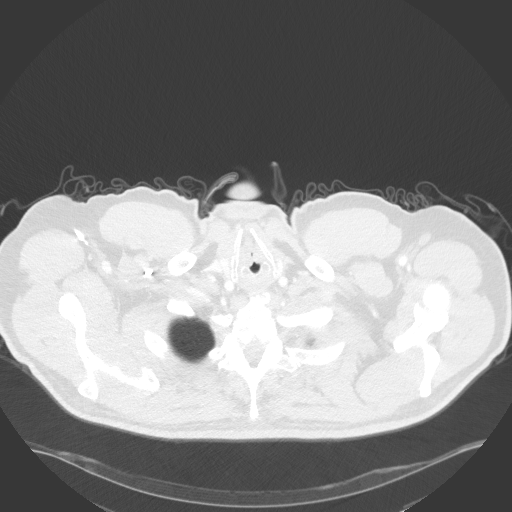

[14 of 34 positions shown; findings below may reference images not displayed]

FINDINGS: Cardiovascular: No significant vascular findings. Normal heart size.
No pericardial effusion. Normal caliber thoracic aorta. Coronary
artery atherosclerotic vascular calcifications.

Mediastinum/Nodes: Enlarged right axillary lymph nodes measuring up
to 1.8 cm in short axis. No mediastinal or hilar lymphadenopathy.
Thyroid gland, trachea, and esophagus demonstrate no significant
abnormalities.

Lungs/Pleura: The previously described nodular density in the right
lower lobe with adjacent subpleural linear density is favored to
represent scar. There is a 5 mm nodule in the left lower lobe
(series 3, image 125).

Upper Abdomen: No acute abnormality. Multiple hypodense foci in the
spleen are similar to prior CT.

Musculoskeletal: Old nondisplaced right lateral eighth rib fracture.
No acute or significant osseous findings. Incidental note is made of
a 6.2 cm lipoma in the lateral aspect of the right external oblique
muscle.
IMPRESSION: 1. Enlarged right axillary lymph nodes measuring up to 1.8 cm in
short axis. Given the multiple hypodense foci in the spleen,
findings are concerning for lymphoma, less likely nodal metastases.
Tissue sampling is recommended for further evaluation.
2. Previously described nodular density in the right lower lobe with
adjacent subpleural linear density is favored to represent scar, as
there is an adjacent old nondisplaced right lateral eighth rib
fracture.
3. 5 mm nodule in the left lower lobe. No follow-up needed if
patient is low-risk. Non-contrast chest CT can be considered in 12
months if patient is high-risk. This recommendation follows the
consensus statement: Guidelines for Management of Incidental
Pulmonary Nodules Detected on CT Images: From the [HOSPITAL]

These results will be called to the ordering clinician or
representative by the Radiologist Assistant, and communication
documented in the PACS or zVision Dashboard.

## 2019-11-22 IMAGING — US US BIOPSY LYMPH NODE
1 series · 13 of 23 positions shown · non-contrast
Comparison: none

INDICATION: Right axillary adenopathy by CT

[Series 1: us biopsy lymph node · 0.09mm/px · 13 of 23 slices shown]
[im 1/23]
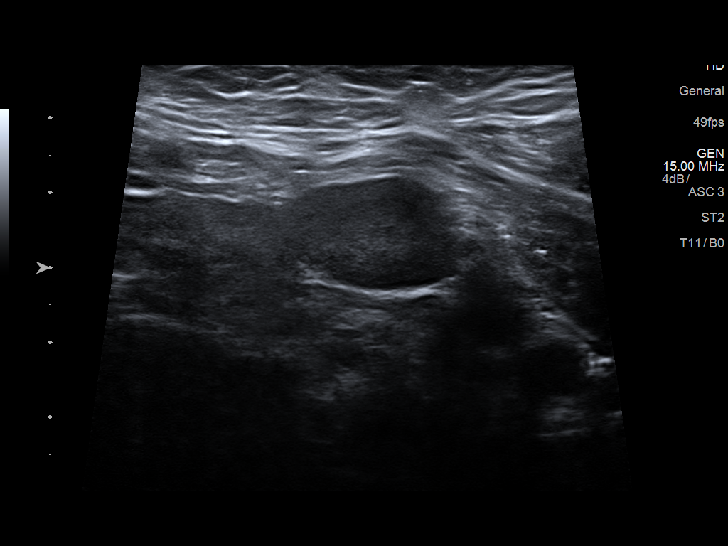
[im 3/23]
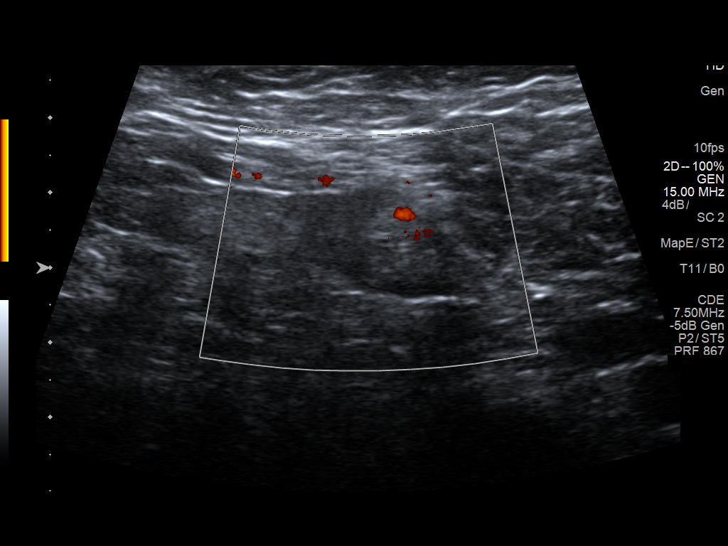
[im 5/23]
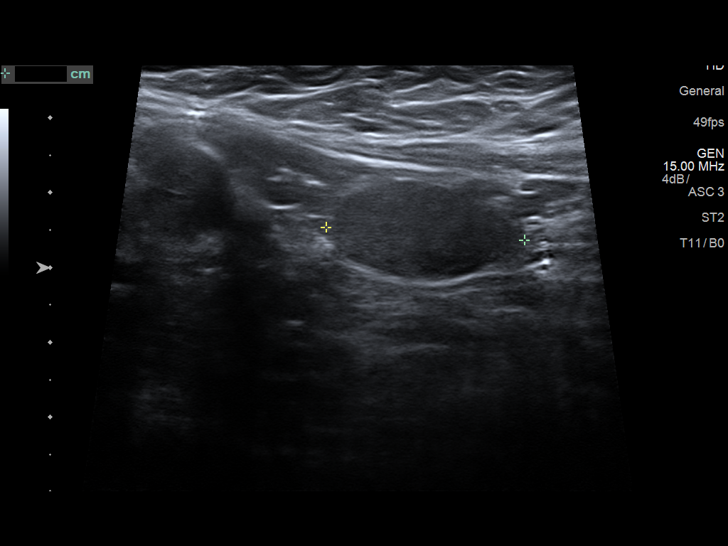
[im 7/23]
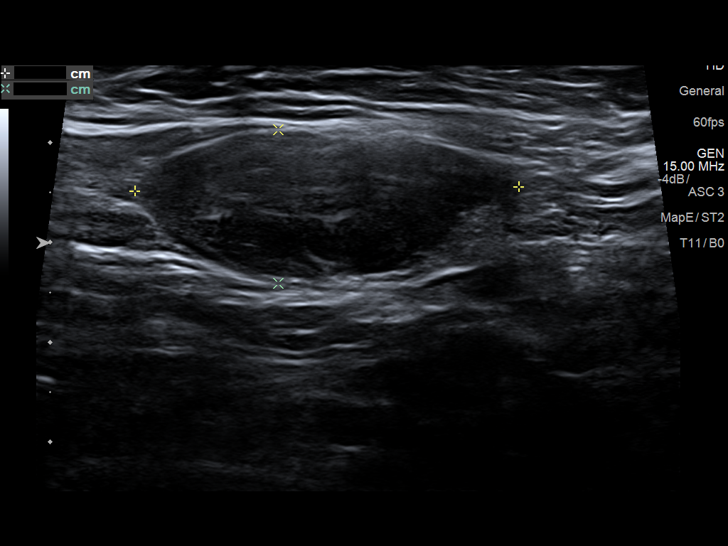
[im 8/23]
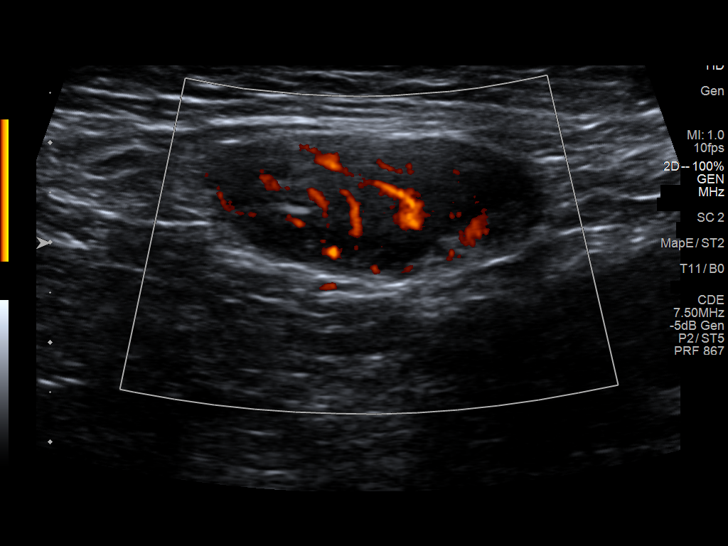
[im 10/23]
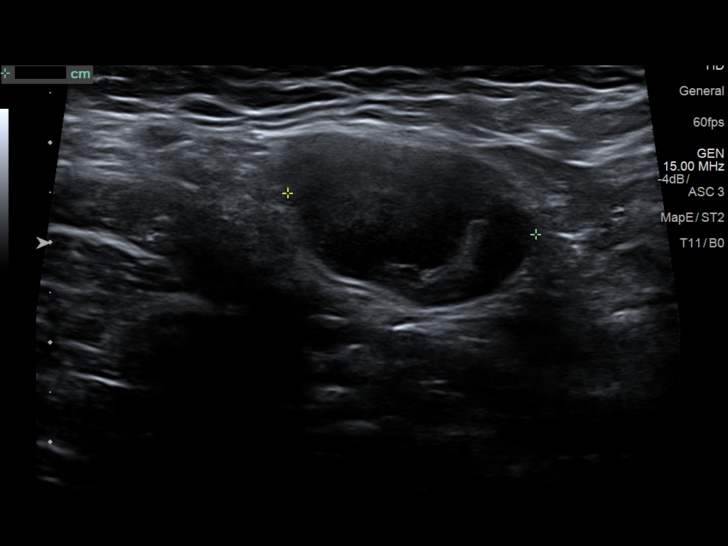
[im 12/23]
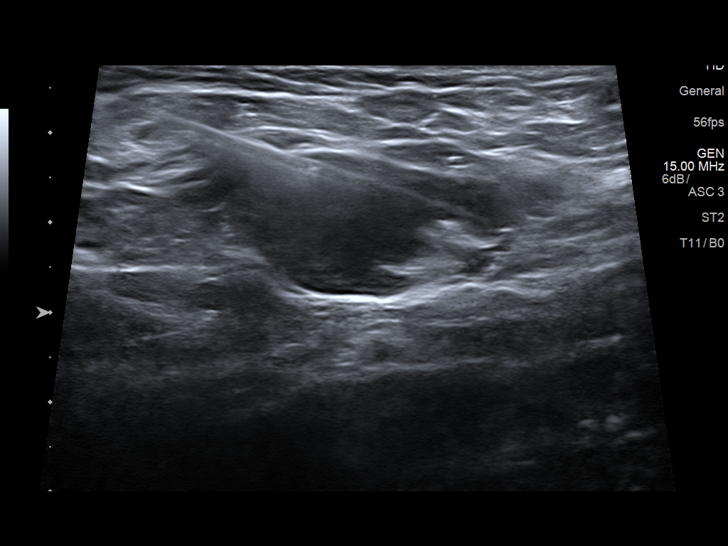
[im 14/23]
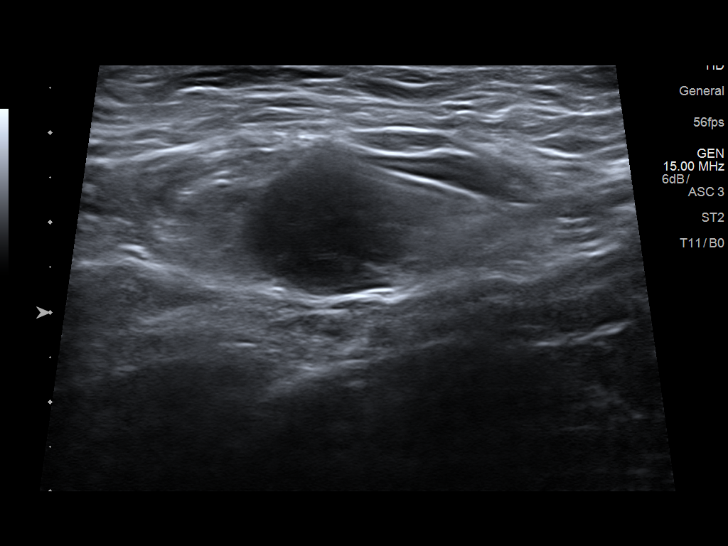
[im 16/23]
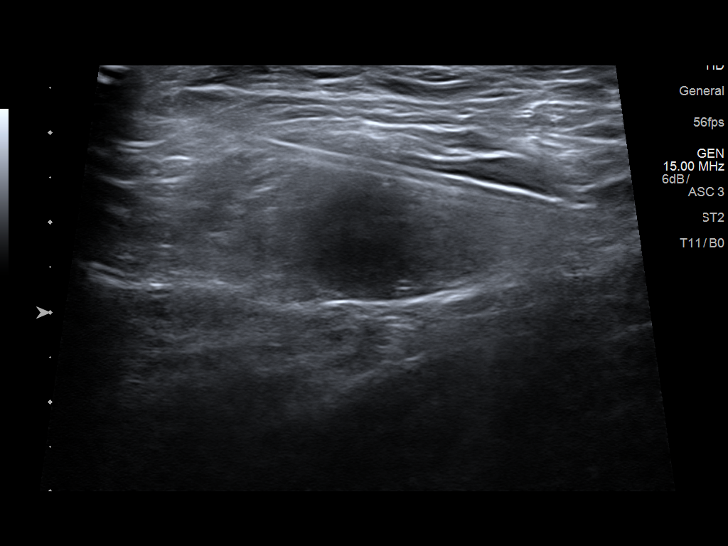
[im 17/23]
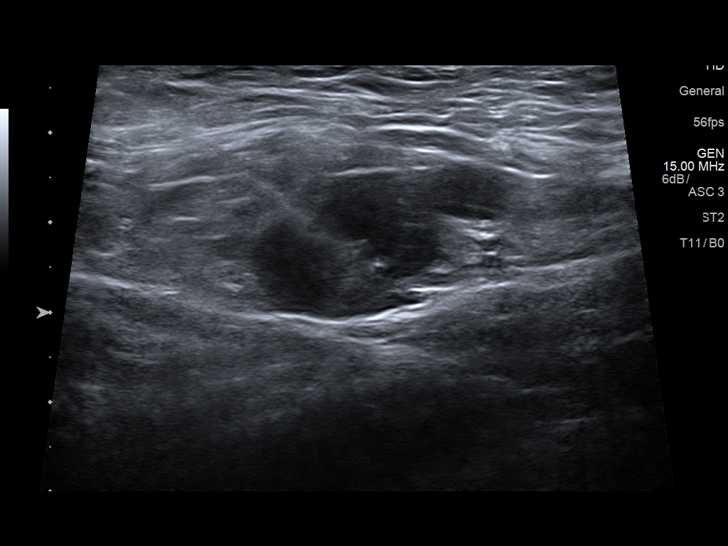
[im 19/23]
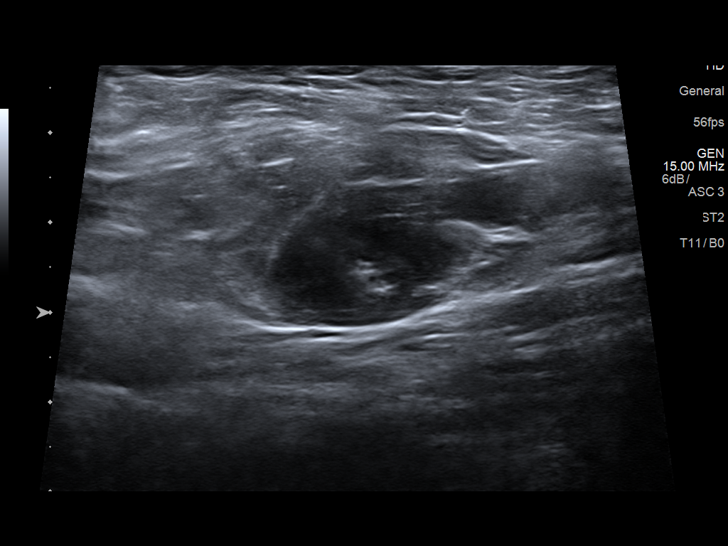
[im 21/23]
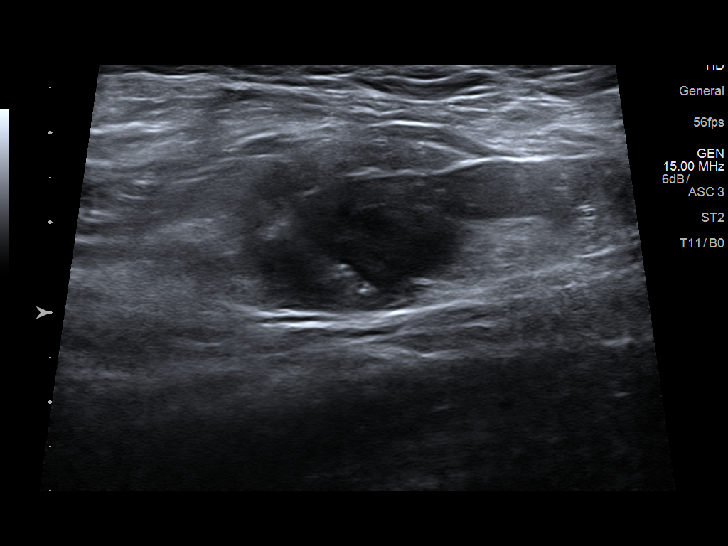
[im 23/23]
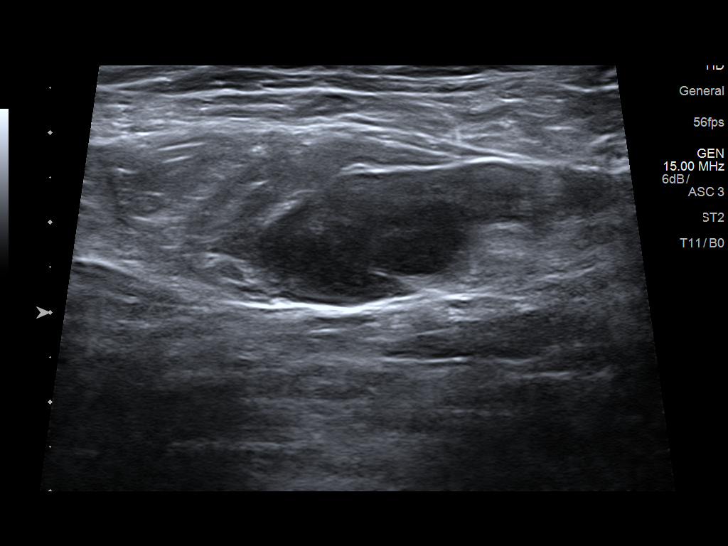

[13 of 23 positions shown; findings below may reference images not displayed]

EXAM:
ULTRASOUND RIGHT AXILLARY ADENOPATHY CORE BIOPSY

MEDICATIONS:
1% LIDOCAINE LOCAL

ANESTHESIA/SEDATION:
Moderate (conscious) sedation was employed during this procedure. A
total of Versed 1.0 mg and Fentanyl 50 mcg was administered
intravenously.

Moderate Sedation Time: 11 minutes. The patient's level of
consciousness and vital signs were monitored continuously by
radiology nursing throughout the procedure under my direct
supervision.

FLUOROSCOPY TIME:  Fluoroscopy Time: NONE.

COMPLICATIONS:
None immediate.

PROCEDURE:
Informed written consent was obtained from the patient after a
thorough discussion of the procedural risks, benefits and
alternatives. All questions were addressed. Maximal Sterile Barrier
Technique was utilized including caps, mask, sterile gowns, sterile
gloves, sterile drape, hand hygiene and skin antiseptic. A timeout
was performed prior to the initiation of the procedure.

Previous imaging reviewed.

Preliminary ultrasound performed. The largest superficial right
axillary 2 cm lymph node was localized. This was correlated with the
CT.

Overlying skin marked.

Under sterile conditions and local anesthesia, an 18 gauge core
biopsy was advanced to the superficial right axillary adenopathy. 18
gauge core biopsies performed. These were placed on a saline moist
Telfa. Images obtained for documentation. Postprocedure imaging
demonstrates no hemorrhage or hematoma. Patient tolerated the biopsy
well.
IMPRESSION: Successful ultrasound right axillary adenopathy 18 gauge core
biopsies

## 2019-12-08 ENCOUNTER — Other Ambulatory Visit: Payer: Self-pay

## 2019-12-08 ENCOUNTER — Encounter: Payer: Self-pay | Admitting: Emergency Medicine

## 2019-12-08 ENCOUNTER — Ambulatory Visit
Admission: EM | Admit: 2019-12-08 | Discharge: 2019-12-08 | Disposition: A | Discharge status: Home / Self Care | Payer: Medicare HMO | Attending: Family Medicine | Admitting: Family Medicine

## 2019-12-08 DIAGNOSIS — J0100 Acute maxillary sinusitis, unspecified: Secondary | ICD-10-CM

## 2019-12-08 MED ORDER — AMOXICILLIN 875 MG PO TABS: 875.0000 mg | ORAL_TABLET | Freq: Two times a day (BID) | ORAL | 0 refills | Status: DC

## 2019-12-08 NOTE — ED Provider Notes (Signed)
[facility]    CSN: [id number] Arrival date & time: 12/08/19  1100      History   Chief Complaint Chief Complaint  Patient presents with  . Sinus Problem    HPI Tymar LEVONNE MAST is a 69 y.o. male.   69 yo male with a c/o sinus pressure, sinus headaches, nasal congestion for the past week. Denies any fevers, chills, cough, body aches, ear pain. States he has a h/o seasonal allergies. Denies any known sick contacts.    Sinus Problem    Past Medical History:  Diagnosis Date  . Allergic rhinitis, cause unspecified   . Allergy   . Chicken pox    childhood  . Essential hypertension, benign   . Hematuria, unspecified   . History of kidney stones   . Impotence of organic origin   . Measles    childhood  . Mumps    childhood  . Nonspecific abnormal electrocardiogram (ECG) (EKG)   . Obstructive sleep apnea (adult) (pediatric)    CPAP  . Other abnormal glucose   . Other B-complex deficiencies   . Personal history of malignant neoplasm of prostate   . Personal history of urinary calculi   . Prostate cancer [facility]) 2005   Prostatectomy  . Pure hypercholesterolemia   . Unspecified urinary incontinence   . Unspecified vitamin D deficiency     Patient Active Problem List   Diagnosis Date Noted  . Family history of cardiovascular disease 01/21/2017  . Caregiver stress 11/17/2016  . ASNHL (asymmetrical sensorineural hearing loss) 12/17/2015  . Obesity (BMI 30.0-34.9) 07/03/2014  . H/O malignant neoplasm of prostate 12/30/2012  . 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 Other abnormal glucose 11/14/2012  . Unspecified vitamin D deficiency 11/14/2012  . Allergic rhinitis 11/14/2012  . Essential hypertension, benign 08/02/2012  . Genuine stress incontinence, male 07/26/2012    Past Surgical History:  Procedure Laterality Date  . COLONOSCOPY  01/22/2013   normal.  [facility].  Repeat 5 years.  . ELBOW SURGERY    . OLECRANON BURSECTOMY Left 06/02/2018   Procedure:  OLECRANON BURSA;  Surgeon: Hessie Knows, MD;  Location: [facility] ORS;  Service: Orthopedics;  Laterality: Left;  . PROSTATECTOMY  2005  . urological surgery  07/2009       Home Medications    Prior to Admission medications   Medication Sig Start Date End Date Taking? Authorizing Provider  ALPRAZolam (XANAX) 0.5 MG tablet TAKE 1 TABLET BY MOUTH EVERY DAY AS NEEDED FOR ANXIETY Patient taking differently: Take 0.5 mg by mouth daily as needed for anxiety. TAKE 1 TABLET BY MOUTH EVERY DAY AS NEEDED FOR ANXIETY 10/29/17  Yes [name], MD  amiodarone (PACERONE) 200 MG tablet Take 200 mg by mouth daily. 12/02/19  Yes [provider]  Ascorbic Acid (VITAMIN C) 1000 MG tablet Take 1,000 mg by mouth daily.   Yes [provider]  atorvastatin (LIPITOR) 10 MG tablet Take by mouth. 11/22/19 11/21/20 Yes [provider]  cholecalciferol (VITAMIN D) 1000 UNITS tablet Take 1,000 Units by mouth 2 (two) times daily.   Yes [provider]  doxazosin (CARDURA) 1 MG tablet Take by mouth. 10/05/19 10/04/20 Yes [provider]  ELIQUIS 5 MG TABS tablet Take 5 mg by mouth 2 (two) times daily. 12/04/19  Yes [provider]  empagliflozin (JARDIANCE) 10 MG TABS tablet Take by mouth. 11/22/19 11/21/20 Yes [provider]  fluticasone (FLONASE) 50 MCG/ACT nasal spray PLACE 2 SPRAYS INTO BOTH NOSTRILS DAILY.  Patient taking differently: Place 2 sprays into both nostrils at bedtime. PLACE 2 SPRAYS INTO BOTH NOSTRILS DAILY. 10/29/17  Yes [name], MD  furosemide (LASIX) 20 MG tablet  11/18/19  Yes [provider]  hydrALAZINE (APRESOLINE) 100 MG tablet Take by mouth. 11/21/19 11/20/20 Yes [provider]  isosorbide dinitrate (ISORDIL) 20 MG tablet Take by mouth. 11/21/19 11/20/20 Yes [provider]  KLOR-CON M20 20 MEQ tablet  11/18/19  Yes [provider]  loratadine (CLARITIN) 10 MG tablet TAKE 1 TABLET (10 MG TOTAL) BY MOUTH  DAILY. Patient taking differently: Take 10 mg by mouth daily as needed for allergies. TAKE 1 TABLET (10 MG TOTAL) BY MOUTH DAILY. 10/29/17  Yes [name], MD  metoprolol (TOPROL-XL) 200 MG 24 hr tablet Take by mouth. 11/22/19 11/21/20 Yes [provider]  olmesartan-hydrochlorothiazide (BENICAR HCT) 40-25 MG tablet  12/04/19  Yes [provider]  oxybutynin (DITROPAN-XL) 5 MG 24 hr tablet Take 1 tablet (5 mg total) by mouth at bedtime. 02/02/18  Yes [name], MD  sacubitril-valsartan (ENTRESTO) 49-51 MG Take by mouth. 11/21/19  Yes [provider]  spironolactone (ALDACTONE) 25 MG tablet Take by mouth. 11/22/19 11/21/20 Yes [provider]  tadalafil (CIALIS) 20 MG tablet TAKE 1 TABLET (20 MG TOTAL) BY MOUTH DAILY AS NEEDED FOR ERECTILE DYSFUNCTION. 10/29/17  Yes [name], MD  torsemide [facility]) 20 MG tablet Take by mouth. 11/22/19 11/21/20 Yes [provider]  valsartan (DIOVAN) 320 MG tablet Take 320 mg by mouth daily. 01/12/18  Yes [provider]  vitamin B-12 (CYANOCOBALAMIN) 1000 MCG tablet Take 1,000 mcg by mouth daily.    Yes [provider]  amoxicillin (AMOXIL) 875 MG tablet Take 1 tablet (875 mg total) by mouth 2 (two) times daily. 12/08/19   Norval Gable, MD  aspirin 81 MG tablet Take 81 mg by mouth daily.    [provider]  cloNIDine (CATAPRES) 0.1 MG tablet Take 1 tablet (0.1 mg total) by mouth 2 (two) times daily. 10/29/17   [name], MD  diltiazem (CARDIZEM CD) 180 MG 24 hr capsule Take 1 capsule (180 mg total) by mouth 2 (two) times daily. 10/29/17   [name], MD  HYDROcodone-acetaminophen (NORCO) 5-325 MG tablet Take 1 tablet by mouth every 6 (six) hours as needed for moderate pain. 06/02/18   Hessie Knows, MD    Family History Family History  Problem Relation Age of Onset  . Heart disease Father 69       AMI age 39; CM.  Marland Kitchen Hypertension Father   . Mental illness Sister        home in  [place]  . Alcohol abuse Sister   . Hypertension Mother   . Heart disease Mother        AMI years ago; no CABG/stents.  . Arthritis Mother   . Dementia Mother   . Heart disease Brother 23       AMI; CM    Social History Social History   Tobacco Use  . Smoking status: Never Smoker  . Smokeless tobacco: Never Used  Substance Use Topics  . Alcohol use: Yes    Alcohol/week: 0.0 standard drinks    Comment: occasional drinks beer 1 per week  . Drug use: No     Allergies   Patient has no known allergies.   Review of Systems Review of Systems   Physical Exam Triage Vital Signs ED Triage Vitals  Enc  Vitals Group     BP 12/08/19 1114 (!) 152/90     Pulse Rate 12/08/19 1114 (!) 52     Resp 12/08/19 1114 16     Temp 12/08/19 1114 98.1 F (36.7 C)     Temp Source 12/08/19 1114 Oral     SpO2 12/08/19 1114 99 %     Weight 12/08/19 1109 207 lb (93.9 kg)     Height 12/08/19 1109 5\' 11"  (1.803 m)     Head Circumference --      Peak Flow --      Pain Score 12/08/19 1109 7     Pain Loc --      Pain Edu? --      Excl. in [place]? --    No data found.  Updated Vital Signs BP (!) 152/90   Pulse (!) 52   Temp 98.1 F (36.7 C) (Oral)   Resp 16   Ht 5\' 11"  (1.803 m)   Wt 93.9 kg   SpO2 99%   BMI 28.87 kg/m   Visual Acuity Right Eye Distance:   Left Eye Distance:   Bilateral Distance:    Right Eye Near:   Left Eye Near:    Bilateral Near:     Physical Exam Vitals and nursing note reviewed.  Constitutional:      General: He is not in acute distress.    Appearance: He is not toxic-appearing or diaphoretic.  HENT:     Right Ear: Tympanic membrane normal.     Left Ear: Tympanic membrane normal.     Nose:     Right Turbinates: Swollen.     Left Turbinates: Swollen.     Right Sinus: Maxillary sinus tenderness present.     Left Sinus: Maxillary sinus tenderness present.     Mouth/Throat:     Pharynx: Oropharynx is clear.  Cardiovascular:     Rate and Rhythm: Normal  rate.     Heart sounds: Normal heart sounds.  Pulmonary:     Effort: Pulmonary effort is normal.     Breath sounds: Normal breath sounds.  Neurological:     Mental Status: He is alert.      UC Treatments / Results  Labs (all labs ordered are listed, but only abnormal results are displayed) Labs Reviewed - No data to display  EKG   Radiology No results found.  Procedures Procedures (including critical care time)  Medications Ordered in UC Medications - No data to display  Initial Impression / Assessment and Plan / UC Course  I have reviewed the triage vital signs and the nursing notes.  Pertinent labs & imaging results that were available during my care of the patient were reviewed by me and considered in my medical decision making (see chart for details).      Final Clinical Impressions(s) / UC Diagnoses   Final diagnoses:  Acute maxillary sinusitis, recurrence not specified    ED Prescriptions    Medication Sig Dispense Auth. Provider   amoxicillin (AMOXIL) 875 MG tablet Take 1 tablet (875 mg total) by mouth 2 (two) times daily. 20 tablet Norval Gable, MD      1. diagnosis reviewed with patient 2. rx as per orders above; reviewed possible side effects, interactions, risks and benefits  3. Recommend supportive treatment with otc antihistamine/decongestant plus steroid nose spray  4. Follow-up prn if symptoms worsen or don't improve   PDMP not reviewed this encounter.   Norval Gable, MD 12/08/19 1137

## 2019-12-08 NOTE — ED Triage Notes (Signed)
Patient c/o sinus pain and pressure that started earlier this week.  Patient denies fevers.

## 2020-06-27 ENCOUNTER — Telehealth: Payer: Self-pay

## 2020-06-27 NOTE — Telephone Encounter (Signed)
Called pt on 06/27/2020 because we received a fax from [employer] asking for Dr. Vonna Kotyk signature on an order for a CPAP and other CPAP supplies. Pt refused to confirm identity and stated he hasn't seen [place] in years asking why I was calling. I informed the pt that I just need him to confirm his identity so I can tell him the reason for my call. Pt still refused to confirm identity. I informed pt to have a great day and apologized for the inconvenience.

## 2022-02-21 ENCOUNTER — Ambulatory Visit
Admission: EM | Admit: 2022-02-21 | Discharge: 2022-02-21 | Disposition: A | Discharge status: Home / Self Care | Payer: Medicare HMO | Attending: Family Medicine | Admitting: Family Medicine

## 2022-02-21 DIAGNOSIS — K625 Hemorrhage of anus and rectum: Secondary | ICD-10-CM

## 2022-02-21 LAB — CBC
HCT: 32.5 % — ABNORMAL LOW (ref 39.0–52.0)
Hemoglobin: 11.2 g/dL — ABNORMAL LOW (ref 13.0–17.0)
MCH: 31.9 pg (ref 26.0–34.0)
MCHC: 34.5 g/dL (ref 30.0–36.0)
MCV: 92.6 fL (ref 80.0–100.0)
Platelets: 150 10*3/uL (ref 150–400)
RBC: 3.51 MIL/uL — ABNORMAL LOW (ref 4.22–5.81)
RDW: 13 % (ref 11.5–15.5)
WBC: 7.1 10*3/uL (ref 4.0–10.5)
nRBC: 0 % (ref 0.0–0.2)

## 2022-02-21 NOTE — Discharge Instructions (Signed)
Your Hemoglobin was 11.2.  Please hold your Eliquis for the next 2 days.  Follow up with your doctor on Monday. You need repeat blood levels (CBC).  You also need a referral for colonoscopy.  If bleeding continues or worsens, please go directly to [facility].  Take care  Dr. Lacinda Axon

## 2022-02-21 NOTE — ED Provider Notes (Signed)
[facility]    CSN: [id number] Arrival date & time: 02/21/22  1041  History   Chief Complaint Chief Complaint  Patient presents with   Rectal Bleeding    HPI 71 year old male with an extensive past medical history including atrial fibrillation hypertension, chronic systolic heart failure, chronic anticoagulation presents with complaints of rectal bleeding.  Patient is accompanied by his wife today.  He reports 3 to 4-day history of blood in his stool.  Denies rectal pain.  Denies abdominal pain.  Good appetite.  No fever.  No melena.  No reports of nausea vomiting.  No reports of hemorrhoids.  No relieving factors.  No other reported symptoms.  No other complaints.  Past Medical History:  Diagnosis Date   Allergic rhinitis, cause unspecified    Allergy    Chicken pox    childhood   Essential hypertension, benign    Hematuria, unspecified    History of kidney stones    Impotence of organic origin    Measles    childhood   Mumps    childhood   Nonspecific abnormal electrocardiogram (ECG) (EKG)    Obstructive sleep apnea (adult) (pediatric)    CPAP   Other abnormal glucose    Other B-complex deficiencies    Personal history of malignant neoplasm of prostate    Personal history of urinary calculi    Prostate cancer ([place]) 2005   Prostatectomy   Pure hypercholesterolemia    Unspecified urinary incontinence    Unspecified vitamin D deficiency     Patient Active Problem List   Diagnosis Date Noted   Family history of cardiovascular disease 01/21/2017   Caregiver stress 11/17/2016   ASNHL (asymmetrical sensorineural hearing loss) 12/17/2015   Obesity (BMI 30.0-34.9) 07/03/2014   H/O malignant neoplasm of prostate 12/30/2012   Cystitis, radiation 12/30/2012   Other abnormal glucose 11/14/2012   Unspecified vitamin D deficiency 11/14/2012   Allergic rhinitis 11/14/2012   Essential hypertension, benign 08/02/2012   Genuine stress incontinence, male 07/26/2012     Past Surgical History:  Procedure Laterality Date   COLONOSCOPY  01/22/2013   normal.  [facility].  Repeat 5 years.   ELBOW SURGERY     OLECRANON BURSECTOMY Left 06/02/2018   Procedure: OLECRANON BURSA;  Surgeon: Hessie Knows, MD;  Location: [facility] ORS;  Service: Orthopedics;  Laterality: Left;   PROSTATECTOMY  2005   urological surgery  07/2009       Home Medications    Prior to Admission medications   Medication Sig Start Date End Date Taking? Authorizing Provider  ALPRAZolam (XANAX) 0.5 MG tablet TAKE 1 TABLET BY MOUTH EVERY DAY AS NEEDED FOR ANXIETY Patient taking differently: Take 0.5 mg by mouth daily as needed for anxiety. TAKE 1 TABLET BY MOUTH EVERY DAY AS NEEDED FOR ANXIETY 10/29/17   [name], MD  amiodarone (PACERONE) 200 MG tablet Take 200 mg by mouth daily. 12/02/19   [provider]  Ascorbic Acid (VITAMIN C) 1000 MG tablet Take 1,000 mg by mouth daily.    [provider]  aspirin 81 MG tablet Take 81 mg by mouth daily.    [provider]  atorvastatin (LIPITOR) 10 MG tablet Take by mouth. 11/22/19 11/21/20  [provider]  cholecalciferol (VITAMIN D) 1000 UNITS tablet Take 1,000 Units by mouth 2 (two) times daily.    [provider]  cloNIDine (CATAPRES) 0.1 MG tablet Take 1 tablet (0.1 mg total) by mouth 2 (two) times daily. 10/29/17   Tamala Julian,  Renette Butters, MD  diltiazem (CARDIZEM CD) 180 MG 24 hr capsule Take 1 capsule (180 mg total) by mouth 2 (two) times daily. 10/29/17   [name], MD  ELIQUIS 5 MG TABS tablet Take 5 mg by mouth 2 (two) times daily. 12/04/19   [provider]  fluticasone (FLONASE) 50 MCG/ACT nasal spray PLACE 2 SPRAYS INTO BOTH NOSTRILS DAILY. Patient taking differently: Place 2 sprays into both nostrils at bedtime. PLACE 2 SPRAYS INTO BOTH NOSTRILS DAILY. 10/29/17   [name], MD  HYDROcodone-acetaminophen (NORCO) 5-325 MG tablet Take 1 tablet by mouth every 6 (six) hours as needed for moderate  pain. 06/02/18   Hessie Knows, MD  isosorbide dinitrate (ISORDIL) 20 MG tablet Take by mouth. 11/21/19 11/20/20  [provider]  KLOR-CON M20 20 MEQ tablet  11/18/19   [provider]  loratadine (CLARITIN) 10 MG tablet TAKE 1 TABLET (10 MG TOTAL) BY MOUTH DAILY. Patient taking differently: Take 10 mg by mouth daily as needed for allergies. TAKE 1 TABLET (10 MG TOTAL) BY MOUTH DAILY. 10/29/17   [name], MD  olmesartan-hydrochlorothiazide (BENICAR HCT) 40-25 MG tablet  12/04/19   [provider]  oxybutynin (DITROPAN-XL) 5 MG 24 hr tablet Take 1 tablet (5 mg total) by mouth at bedtime. 02/02/18   [name], MD  sacubitril-valsartan (ENTRESTO) 49-51 MG Take by mouth. 11/21/19   [provider]  tadalafil (CIALIS) 20 MG tablet TAKE 1 TABLET (20 MG TOTAL) BY MOUTH DAILY AS NEEDED FOR ERECTILE DYSFUNCTION. 10/29/17   [name], MD  valsartan (DIOVAN) 320 MG tablet Take 320 mg by mouth daily. 01/12/18   [provider]  vitamin B-12 (CYANOCOBALAMIN) 1000 MCG tablet Take 1,000 mcg by mouth daily.     [provider]    Family History Family History  Problem Relation Age of Onset   Heart disease Father 69       AMI age 60; CM.   Hypertension Father    Mental illness Sister        home in [place]   Alcohol abuse Sister    Hypertension Mother    Heart disease Mother        AMI years ago; no CABG/stents.   Arthritis Mother    Dementia Mother    Heart disease Brother 70       AMI; CM    Social History Social History   Tobacco Use   Smoking status: Never   Smokeless tobacco: Never  Vaping Use   Vaping Use: Never used  Substance Use Topics   Alcohol use: Yes    Alcohol/week: 0.0 standard drinks of alcohol    Comment: occasional drinks beer 1 per week   Drug use: No     Allergies   Patient has no known allergies.   Review of Systems Review of Systems  Constitutional:  Negative for appetite change and fever.   Gastrointestinal:  Positive for blood in stool.   Physical Exam Triage Vital Signs ED Triage Vitals  Enc Vitals Group     BP 02/21/22 1109 103/68     Pulse Rate 02/21/22 1109 75     Resp --      Temp 02/21/22 1109 98.2 F (36.8 C)     Temp Source 02/21/22 1109 Oral     SpO2 02/21/22 1109 100 %     Weight 02/21/22 1107 193 lb (87.5 kg)     Height 02/21/22 1107 '5\' 11"'$  (1.803 m)  Head Circumference --      Peak Flow --      Pain Score 02/21/22 1107 0     Pain Loc --      Pain Edu? --      Excl. in [place]? --    Updated Vital Signs BP 103/68 (BP Location: Left Arm)   Pulse 75   Temp 98.2 F (36.8 C) (Oral)   Ht '5\' 11"'$  (1.803 m)   Wt 87.5 kg   SpO2 100%   BMI 26.92 kg/m   Visual Acuity Right Eye Distance:   Left Eye Distance:   Bilateral Distance:    Right Eye Near:   Left Eye Near:    Bilateral Near:     Physical Exam Vitals and nursing note reviewed.  Constitutional:      General: He is not in acute distress.    Appearance: Normal appearance.  HENT:     Head: Normocephalic and atraumatic.  Cardiovascular:     Rate and Rhythm: Normal rate and regular rhythm.     Heart sounds: Murmur heard.  Pulmonary:     Effort: Pulmonary effort is normal.     Breath sounds: Normal breath sounds. No wheezing, rhonchi or rales.  Abdominal:     General: There is no distension.     Palpations: Abdomen is soft.     Tenderness: There is no abdominal tenderness.  Genitourinary:    Rectum: Normal. No mass, anal fissure or external hemorrhoid.  Neurological:     Mental Status: He is alert.      UC Treatments / Results  Labs (all labs ordered are listed, but only abnormal results are displayed) Labs Reviewed  CBC - Abnormal; Notable for the following components:      Result Value   RBC 3.51 (*)    Hemoglobin 11.2 (*)    HCT 32.5 (*)    All other components within normal limits    EKG   Radiology No results found.  Procedures Procedures (including critical  care time)  Medications Ordered in UC Medications - No data to display  Initial Impression / Assessment and Plan / UC Course  I have reviewed the triage vital signs and the nursing notes.  Pertinent labs & imaging results that were available during my care of the patient were reviewed by me and considered in my medical decision making (see chart for details).    71 year old male presents with rectal bleeding.  Hemoglobin 11.2 today.  His most recent hemoglobin was 12.6.  Holding Eliquis.  Advised to follow-up with his primary doctor on Monday.  Will need to be seen again and needs repeat CBC.  Will need referral for colonoscopy.  Can resume Eliquis if bleeding ceases and hemoglobin remains stable.  Advised patient that if bleeding continues to occur or worsens he needs to go directly to the ER.  Final Clinical Impressions(s) / UC Diagnoses   Final diagnoses:  Rectal bleeding     Discharge Instructions      Your Hemoglobin was 11.2.  Please hold your Eliquis for the next 2 days.  Follow up with your doctor on Monday. You need repeat blood levels (CBC).  You also need a referral for colonoscopy.  If bleeding continues or worsens, please go directly to [facility].  Take care  Dr. Lacinda Axon      ED Prescriptions   None    PDMP not reviewed this encounter.   Coral Spikes, [place] 02/21/22 1212

## 2022-02-21 NOTE — ED Triage Notes (Signed)
Patient present to UC for blood in his stool.  Started about 3-4 days ago.   Patient denies any other symptoms

## 2022-03-23 ENCOUNTER — Other Ambulatory Visit: Payer: Self-pay | Admitting: Gastroenterology

## 2022-03-24 ENCOUNTER — Encounter (HOSPITAL_COMMUNITY): Payer: Self-pay | Admitting: Gastroenterology

## 2022-03-31 ENCOUNTER — Encounter (HOSPITAL_COMMUNITY)
Admission: RE | Disposition: A | Discharge status: Home / Self Care | Payer: Self-pay | Source: Home / Self Care | Attending: Gastroenterology

## 2022-03-31 ENCOUNTER — Ambulatory Visit (HOSPITAL_COMMUNITY): Payer: Medicare HMO | Admitting: Anesthesiology

## 2022-03-31 ENCOUNTER — Other Ambulatory Visit: Payer: Self-pay

## 2022-03-31 ENCOUNTER — Ambulatory Visit (HOSPITAL_COMMUNITY)
Admission: RE | Admit: 2022-03-31 | Discharge: 2022-03-31 | Disposition: A | Discharge status: Home / Self Care | Payer: Medicare HMO | Attending: Gastroenterology | Admitting: Gastroenterology

## 2022-03-31 ENCOUNTER — Encounter (HOSPITAL_COMMUNITY): Payer: Self-pay | Admitting: Gastroenterology

## 2022-03-31 ENCOUNTER — Ambulatory Visit (HOSPITAL_BASED_OUTPATIENT_CLINIC_OR_DEPARTMENT_OTHER): Payer: Medicare HMO | Admitting: Anesthesiology

## 2022-03-31 DIAGNOSIS — K648 Other hemorrhoids: Secondary | ICD-10-CM | POA: Diagnosis not present

## 2022-03-31 DIAGNOSIS — K219 Gastro-esophageal reflux disease without esophagitis: Secondary | ICD-10-CM | POA: Insufficient documentation

## 2022-03-31 DIAGNOSIS — K5730 Diverticulosis of large intestine without perforation or abscess without bleeding: Secondary | ICD-10-CM | POA: Diagnosis not present

## 2022-03-31 DIAGNOSIS — Z79899 Other long term (current) drug therapy: Secondary | ICD-10-CM | POA: Diagnosis not present

## 2022-03-31 DIAGNOSIS — I10 Essential (primary) hypertension: Secondary | ICD-10-CM | POA: Diagnosis not present

## 2022-03-31 DIAGNOSIS — G4730 Sleep apnea, unspecified: Secondary | ICD-10-CM | POA: Diagnosis not present

## 2022-03-31 DIAGNOSIS — K921 Melena: Secondary | ICD-10-CM | POA: Insufficient documentation

## 2022-03-31 DIAGNOSIS — K5731 Diverticulosis of large intestine without perforation or abscess with bleeding: Secondary | ICD-10-CM | POA: Diagnosis not present

## 2022-03-31 DIAGNOSIS — Z8546 Personal history of malignant neoplasm of prostate: Secondary | ICD-10-CM | POA: Diagnosis not present

## 2022-03-31 DIAGNOSIS — D509 Iron deficiency anemia, unspecified: Secondary | ICD-10-CM | POA: Insufficient documentation

## 2022-03-31 DIAGNOSIS — K3189 Other diseases of stomach and duodenum: Secondary | ICD-10-CM

## 2022-03-31 DIAGNOSIS — E119 Type 2 diabetes mellitus without complications: Secondary | ICD-10-CM | POA: Insufficient documentation

## 2022-03-31 DIAGNOSIS — Z7984 Long term (current) use of oral hypoglycemic drugs: Secondary | ICD-10-CM | POA: Diagnosis not present

## 2022-03-31 HISTORY — PX: ESOPHAGOGASTRODUODENOSCOPY (EGD) WITH PROPOFOL: SHX5813

## 2022-03-31 HISTORY — PX: COLONOSCOPY WITH PROPOFOL: SHX5780

## 2022-03-31 HISTORY — PX: BIOPSY: SHX5522

## 2022-03-31 LAB — GLUCOSE, CAPILLARY: Glucose-Capillary: 90 mg/dL (ref 70–99)

## 2022-03-31 SURGERY — COLONOSCOPY WITH PROPOFOL
Anesthesia: Monitor Anesthesia Care | Laterality: Bilateral

## 2022-03-31 MED ORDER — PROPOFOL 500 MG/50ML IV EMUL
INTRAVENOUS | Status: DC | PRN
  Administered 2022-03-31: 150 ug/kg/min via INTRAVENOUS

## 2022-03-31 MED ORDER — SODIUM CHLORIDE 0.9 % IV SOLN: INTRAVENOUS | Status: DC

## 2022-03-31 MED ORDER — LACTATED RINGERS IV SOLN: INTRAVENOUS | Status: DC

## 2022-03-31 MED ORDER — ELIQUIS 5 MG PO TABS: 5.0000 mg | ORAL_TABLET | Freq: Two times a day (BID) | ORAL | Status: AC

## 2022-03-31 SURGICAL SUPPLY — 25 items

## 2022-03-31 NOTE — Discharge Instructions (Addendum)

## 2022-03-31 NOTE — Op Note (Signed)
[facility] Patient Name: Juan Chavez Procedure Date: 03/31/2022 MRN: [id number] Attending MD: Arta Silence , MD Date of Birth: Mar 11, 1951 CSN: [id number] Age: 71 Admit Type: Outpatient Procedure:                Upper GI endoscopy Indications:              Iron deficiency anemia, Melena Providers:                Arta Silence, MD, Ladoris Gene, RN, Darliss Cheney,                            Technician Referring MD:              Medicines:                Monitored Anesthesia Care Complications:            No immediate complications. Estimated Blood Loss:     Estimated blood loss: none. Procedure:                Pre-Anesthesia Assessment:                           - Prior to the procedure, a History and Physical                            was performed, and patient medications and                            allergies were reviewed. The patient's tolerance of                            previous anesthesia was also reviewed. The risks                            and benefits of the procedure and the sedation                            options and risks were discussed with the patient.                            All questions were answered, and informed consent                            was obtained. Prior Anticoagulants: The patient has                            taken Eliquis (apixaban), last dose was 3 days                            prior to procedure. ASA Grade Assessment: III - A                            patient with severe systemic disease. After  reviewing the risks and benefits, the patient was                            deemed in satisfactory condition to undergo the                            procedure.                           After obtaining informed consent, the endoscope was                            passed under direct vision. Throughout the                            procedure, the patient's blood pressure, pulse, and                             oxygen saturations were monitored continuously. The                            [id number]-[id number] ([id number] Olympus endoscope was introduced                            through the mouth, and advanced to the second part                            of duodenum. The upper GI endoscopy was                            accomplished without difficulty. The patient                            tolerated the procedure well. Scope In: Scope Out: Findings:      The examined esophagus was normal.      The entire examined stomach was normal.      Patchy moderately erythematous mucosa without active bleeding and with       no stigmata of bleeding was found in the first portion of the duodenum       and in the second portion of the duodenum. Biopsies were taken with a       cold forceps for histology.      The exam of the duodenum was otherwise normal. Impression:               - Normal esophagus.                           - Normal stomach.                           - Erythematous duodenopathy, almost akin to                            melanosis of colon, or staining from iron  deposition.. Biopsied. Moderate Sedation:      None Recommendation:           - Await pathology results.                           - Perform a colonoscopy today. Procedure Code(s):        --- Professional ---                           [phone], Esophagogastroduodenoscopy, flexible,                            transoral; with biopsy, single or multiple Diagnosis Code(s):        --- Professional ---                           K31.89, Other diseases of stomach and duodenum                           D50.9, Iron deficiency anemia, unspecified                           K92.1, Melena (includes Hematochezia) CPT copyright 2019 American Medical Association. All rights reserved. The codes documented in this report are preliminary and upon coder review may  be revised to meet current compliance  requirements. Arta Silence, MD 03/31/2022 10:45:32 AM This report has been signed electronically. Number of Addenda: 0

## 2022-03-31 NOTE — Transfer of Care (Signed)
Immediate Anesthesia Transfer of Care Note  Patient: Juan Chavez  Procedure(s) Performed: COLONOSCOPY WITH PROPOFOL (Bilateral) ESOPHAGOGASTRODUODENOSCOPY (EGD) WITH PROPOFOL (Bilateral) BIOPSY  Patient Location: Endoscopy Unit  Anesthesia Type:MAC  Level of Consciousness: drowsy  Airway & Oxygen Therapy: Patient Spontanous Breathing and Patient connected to face mask oxygen  Post-op Assessment: Report given to RN and Post -op Vital signs reviewed and stable  Post vital signs: Reviewed and stable  Last Vitals:  Vitals Value Taken Time  BP    Temp    Pulse 68 03/31/22 1035  Resp 9 03/31/22 1035  SpO2 100 % 03/31/22 1035  Vitals shown include unvalidated device data.  Last Pain:  Vitals:   03/31/22 0821  TempSrc: Temporal  PainSc: 0-No pain         Complications: No notable events documented.

## 2022-03-31 NOTE — Anesthesia Preprocedure Evaluation (Addendum)
Anesthesia Evaluation  Patient identified by MRN, date of birth, ID band Patient awake    Reviewed: Allergy & Precautions, NPO status , Patient's Chart, lab work & pertinent test results, reviewed documented beta blocker date and time   Airway Mallampati: III  TM Distance: >3 FB Neck ROM: Full    Dental  (+) Dental Advisory Given, Poor Dentition, Missing   Pulmonary sleep apnea and Continuous Positive Airway Pressure Ventilation ,    Pulmonary exam normal breath sounds clear to auscultation       Cardiovascular hypertension, Pt. on medications and Pt. on home beta blockers Normal cardiovascular exam Rhythm:Regular Rate:Normal     Neuro/Psych negative neurological ROS     GI/Hepatic Neg liver ROS, GERD  Medicated,melena, rectal bleeding, anemia   Endo/Other  diabetes, Type 2, Oral Hypoglycemic Agents  Renal/GU negative Renal ROS   Prostate cancer     Musculoskeletal negative musculoskeletal ROS (+)   Abdominal   Peds  Hematology  (+) Blood dyscrasia (Eliquis), ,   Anesthesia Other Findings Day of surgery medications reviewed with the patient.  Reproductive/Obstetrics                            Anesthesia Physical Anesthesia Plan  ASA: 3  Anesthesia Plan: MAC   Post-op Pain Management: Minimal or no pain anticipated   Induction: Intravenous  PONV Risk Score and Plan: 1 and TIVA and Treatment may vary due to age or medical condition  Airway Management Planned: Nasal Cannula and Natural Airway  Additional Equipment:   Intra-op Plan:   Post-operative Plan:   Informed Consent: I have reviewed the patients History and Physical, chart, labs and discussed the procedure including the risks, benefits and alternatives for the proposed anesthesia with the patient or authorized representative who has indicated his/her understanding and acceptance.     Dental advisory given  Plan  Discussed with: CRNA and Anesthesiologist  Anesthesia Plan Comments:         Anesthesia Quick Evaluation

## 2022-03-31 NOTE — Op Note (Signed)
[facility] Patient Name: Juan Chavez Procedure Date: 03/31/2022 MRN: [id number] Attending MD: Arta Silence , MD Date of Birth: 1951-01-03 CSN: [id number] Age: 71 Admit Type: Outpatient Procedure:                Colonoscopy Indications:              Melena, Iron deficiency anemia Providers:                Arta Silence, MD, Ladoris Gene, RN, Darliss Cheney,                            Technician Referring MD:              Medicines:                Monitored Anesthesia Care Complications:            No immediate complications. Estimated Blood Loss:     Estimated blood loss: none. Procedure:                Pre-Anesthesia Assessment:                           - Prior to the procedure, a History and Physical                            was performed, and patient medications and                            allergies were reviewed. The patient's tolerance of                            previous anesthesia was also reviewed. The risks                            and benefits of the procedure and the sedation                            options and risks were discussed with the patient.                            All questions were answered, and informed consent                            was obtained. Prior Anticoagulants: The patient has                            taken Eliquis (apixaban), last dose was 3 days                            prior to procedure. ASA Grade Assessment: III - A                            patient with severe systemic disease. After  reviewing the risks and benefits, the patient was                            deemed in satisfactory condition to undergo the                            procedure.                           After obtaining informed consent, the colonoscope                            was passed under direct vision. Throughout the                            procedure, the patient's blood pressure, pulse, and                             oxygen saturations were monitored continuously. The                            [id number]-HQ190L ([id number]) Olympus colonoscope was                            introduced through the anus and advanced to the the                            cecum, identified by appendiceal orifice and                            ileocecal valve. The terminal ileum, ileocecal                            valve, appendiceal orifice, and rectum were                            photographed. The entire colon was examined. The                            colonoscopy was performed without difficulty. The                            patient tolerated the procedure well. The quality                            of the bowel preparation was good. Scope In: 10:07:29 AM Scope Out: 10:27:56 AM Scope Withdrawal Time: 0 hours 10 minutes 46 seconds  Total Procedure Duration: 0 hours 20 minutes 27 seconds  Findings:      The perianal and digital rectal examinations were normal.      Internal hemorrhoids were found during retroflexion. The hemorrhoids       were mild.      The mucosa vascular pattern in the mid rectum was locally increased.      Many medium-mouthed diverticula were found in the sigmoid colon,  descending colon and transverse colon.      Colon otherwise normal; no other polyps, masses, vascular ectasias, or       inflammatory changes were seen.      No additional abnormalities were found on retroflexion. Impression:               - Internal hemorrhoids.                           - Increased mucosa vascular pattern in the mid                            rectum.                           - Diverticulosis in the sigmoid colon, in the                            descending colon and in the transverse colon.                           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                           - The examination was otherwise normal. Overall, no                             clear source for anemia on endoscopy/colonoscopy                            exams today. Perhaps small bowel AVMs, perhaps                            non-GI source. Moderate Sedation:      Not Applicable - Patient had care per Anesthesia. Recommendation:           - Patient has a contact number available for                            emergencies. The signs and symptoms of potential                            delayed complications were discussed with the                            patient. Return to normal activities tomorrow.                            Written discharge instructions were provided to the                            patient.                           -  Discharge patient to home (via wheelchair).                           - Resume previous diet today.                           - Continue present medications.                           - Resume Eliquis (apixaban) at prior dose tomorrow.                           - Return to GI clinic after studies are complete. Procedure Code(s):        --- Professional ---                           [phone], Colonoscopy, flexible; diagnostic, including                            collection of specimen(s) by brushing or washing,                            when performed (separate procedure) Diagnosis Code(s):        --- Professional ---                           K64.8, Other hemorrhoids                           K92.1, Melena (includes Hematochezia)                           D50.9, Iron deficiency anemia, unspecified                           K57.30, Diverticulosis of large intestine without                            perforation or abscess without bleeding CPT copyright 2019 [employer]. All rights reserved. The codes documented in this report are preliminary and upon coder review may  be revised to meet current compliance requirements. Arta Silence, MD 03/31/2022 10:42:13 AM This report has been signed  electronically. Number of Addenda: 0

## 2022-03-31 NOTE — H&P (Signed)
[facility] H/P Note  Chief Complaint: anemia, abdominal pain  HPI: Juan Chavez is an 71 y.o. male.  Presenting abdominal pain, melena, anemia.  Chronic anticoagulation.    Past Medical History:  Diagnosis Date   Allergic rhinitis, cause unspecified    Allergy    Chicken pox    childhood   Essential hypertension, benign    Hematuria, unspecified    History of kidney stones    Impotence of organic origin    Measles    childhood   Mumps    childhood   Nonspecific abnormal electrocardiogram (ECG) (EKG)    Obstructive sleep apnea (adult) (pediatric)    CPAP   Other abnormal glucose    Other B-complex deficiencies    Personal history of malignant neoplasm of prostate    Personal history of urinary calculi    Prostate cancer ([place]) 2005   Prostatectomy   Pure hypercholesterolemia    Unspecified urinary incontinence    Unspecified vitamin D deficiency     Past Surgical History:  Procedure Laterality Date   COLONOSCOPY  01/22/2013   normal.  [facility].  Repeat 5 years.   ELBOW SURGERY     OLECRANON BURSECTOMY Left 06/02/2018   Procedure: OLECRANON BURSA;  Surgeon: Hessie Knows, MD;  Location: [facility] ORS;  Service: Orthopedics;  Laterality: Left;   PROSTATECTOMY  2005   urological surgery  07/2009    Medications Prior to Admission  Medication Sig Dispense Refill   ALPRAZolam (XANAX) 0.5 MG tablet TAKE 1 TABLET BY MOUTH EVERY DAY AS NEEDED FOR ANXIETY (Patient taking differently: Take 0.5 mg by mouth daily as needed for anxiety or sleep.) 30 tablet 3   amiodarone (PACERONE) 200 MG tablet Take 200 mg by mouth daily.     atorvastatin (LIPITOR) 40 MG tablet Take 40 mg by mouth every morning.     carvedilol (COREG) 3.125 MG tablet Take 3.125 mg by mouth 2 (two) times daily.     cholecalciferol (VITAMIN D) 1000 UNITS tablet Take 1,000 Units by mouth daily.     clotrimazole-betamethasone (LOTRISONE) cream Apply 1 Application topically daily.     doxazosin (CARDURA) 1 MG  tablet Take 1 tablet by mouth at bedtime.     ELIQUIS 5 MG TABS tablet Take 5 mg by mouth 2 (two) times daily.     fluticasone (FLONASE) 50 MCG/ACT nasal spray PLACE 2 SPRAYS INTO BOTH NOSTRILS DAILY. (Patient taking differently: Place 2 sprays into both nostrils at bedtime as needed for allergies.) 16 g 11   hydrALAZINE (APRESOLINE) 100 MG tablet Take 100 mg by mouth 2 (two) times daily.     JARDIANCE 10 MG TABS tablet Take 10 mg by mouth daily.     loratadine (CLARITIN) 10 MG tablet TAKE 1 TABLET (10 MG TOTAL) BY MOUTH DAILY. (Patient taking differently: Take 10 mg by mouth daily as needed for allergies. TAKE 1 TABLET (10 MG TOTAL) BY MOUTH DAILY.) 90 tablet 3   oxybutynin (DITROPAN-XL) 5 MG 24 hr tablet Take 1 tablet (5 mg total) by mouth at bedtime. 90 tablet 1   pantoprazole (PROTONIX) 40 MG tablet Take 40 mg by mouth daily.     sacubitril-valsartan (ENTRESTO) 49-51 MG Take 1 tablet by mouth 2 (two) times daily.     spironolactone (ALDACTONE) 25 MG tablet Take 25 mg by mouth daily.     tadalafil (CIALIS) 20 MG tablet TAKE 1 TABLET (20 MG TOTAL) BY MOUTH DAILY AS NEEDED FOR ERECTILE DYSFUNCTION. 8 tablet 11  vitamin B-12 (CYANOCOBALAMIN) 1000 MCG tablet Take 1,000 mcg by mouth daily.       Allergies: No Known Allergies  Family History  Problem Relation Age of Onset   Heart disease Father 74       AMI age 63; CM.   Hypertension Father    Mental illness Sister        home in [place]   Alcohol abuse Sister    Hypertension Mother    Heart disease Mother        AMI years ago; no CABG/stents.   Arthritis Mother    Dementia Mother    Heart disease Brother 43       AMI; CM    Social History:  reports that he has never smoked. He has never used smokeless tobacco. He reports current alcohol use. He reports that he does not use drugs.   ROS: As per HPI, all others negative  Blood pressure (!) 166/70, pulse 64, temperature (!) 97.2 F (36.2 C), temperature source Temporal, resp. rate 20,  height '5\' 11"'$  (1.803 m), weight 87.5 kg, SpO2 100 %. General appearance: NAD HEENT:  /AT, anicteric NECK:  Supple CV:  Regular ABD:  Soft, non-tender NEURO:  A/O, no encephalopathy  Results for orders placed or performed during the hospital encounter of 03/31/22 (from the past 48 hour(s))  Glucose, capillary     Status: None   Collection Time: 03/31/22  8:24 AM  Result Value Ref Range   Glucose-Capillary 90 70 - 99 mg/dL    Comment: Glucose reference range applies only to samples taken after fasting for at least 8 hours.   No results found.  Assessment/Plan   Melena, resolved. Epigastric pain, resolved. Chronic anticoagulation for atrial fibrillation, on hold. Anemia. Endoscopy and colonoscopy for further evaluation. Risks (bleeding, infection, bowel perforation that could require surgery, sedation-related changes in cardiopulmonary systems), benefits (identification and possible treatment of source of symptoms, exclusion of certain causes of symptoms), and alternatives (watchful waiting, radiographic imaging studies, empiric medical treatment) of upper endoscopy (EGD) were explained to patient/family in detail and patient wishes to proceed.  Risks (bleeding, infection, bowel perforation that could require surgery, sedation-related changes in cardiopulmonary systems), benefits (identification and possible treatment of source of symptoms, exclusion of certain causes of symptoms), and alternatives (watchful waiting, radiographic imaging studies, empiric medical treatment) of colonoscopy were explained to patient/family in detail and patient wishes to proceed.   Juan Chavez M 03/31/2022, 9:30 AM

## 2022-04-01 ENCOUNTER — Encounter (HOSPITAL_COMMUNITY): Payer: Self-pay | Admitting: Gastroenterology

## 2022-04-01 LAB — SURGICAL PATHOLOGY

## 2022-04-01 NOTE — Anesthesia Postprocedure Evaluation (Signed)
Anesthesia Post Note  Patient: Juan Chavez  Procedure(s) Performed: COLONOSCOPY WITH PROPOFOL (Bilateral) ESOPHAGOGASTRODUODENOSCOPY (EGD) WITH PROPOFOL (Bilateral) BIOPSY     Patient location during evaluation: Endoscopy Anesthesia Type: MAC Level of consciousness: oriented, awake and alert and awake Pain management: pain level controlled Vital Signs Assessment: post-procedure vital signs reviewed and stable Respiratory status: spontaneous breathing, nonlabored ventilation, respiratory function stable and patient connected to nasal cannula oxygen Cardiovascular status: blood pressure returned to baseline and stable Postop Assessment: no headache, no backache and no apparent nausea or vomiting Anesthetic complications: no   No notable events documented.  Last Vitals:  Vitals:   03/31/22 1100 03/31/22 1110  BP: (!) 162/92 (!) 168/84  Pulse: 63 (!) 56  Resp: 16 18  Temp:    SpO2: 100% 100%    Last Pain:  Vitals:   03/31/22 1110  TempSrc:   PainSc: 0-No pain                 Santa Lighter

## 2022-05-03 ENCOUNTER — Ambulatory Visit
Admission: EM | Admit: 2022-05-03 | Discharge: 2022-05-03 | Disposition: A | Discharge status: Home / Self Care | Payer: Medicare HMO | Attending: Family Medicine | Admitting: Family Medicine

## 2022-05-03 ENCOUNTER — Ambulatory Visit (INDEPENDENT_AMBULATORY_CARE_PROVIDER_SITE_OTHER): Payer: Medicare HMO

## 2022-05-03 DIAGNOSIS — J209 Acute bronchitis, unspecified: Secondary | ICD-10-CM | POA: Diagnosis present

## 2022-05-03 DIAGNOSIS — R059 Cough, unspecified: Secondary | ICD-10-CM

## 2022-05-03 DIAGNOSIS — Z20822 Contact with and (suspected) exposure to covid-19: Secondary | ICD-10-CM | POA: Diagnosis not present

## 2022-05-03 DIAGNOSIS — J189 Pneumonia, unspecified organism: Secondary | ICD-10-CM | POA: Diagnosis not present

## 2022-05-03 LAB — SARS CORONAVIRUS 2 BY RT PCR: SARS Coronavirus 2 by RT PCR: NEGATIVE

## 2022-05-03 MED ORDER — AMOXICILLIN 875 MG PO TABS: 875.0000 mg | ORAL_TABLET | Freq: Two times a day (BID) | ORAL | 0 refills | Status: AC

## 2022-05-03 MED ORDER — ALBUTEROL SULFATE HFA 108 (90 BASE) MCG/ACT IN AERS: 2.0000 | INHALATION_SPRAY | RESPIRATORY_TRACT | 0 refills | Status: AC | PRN

## 2022-05-03 MED ORDER — DOXYCYCLINE HYCLATE 100 MG PO CAPS: 100.0000 mg | ORAL_CAPSULE | Freq: Two times a day (BID) | ORAL | 0 refills | Status: AC

## 2022-05-03 NOTE — Discharge Instructions (Addendum)
Your COVID test is negative.  Stop by the pharmacy to pick up your antibiotics.

## 2022-05-03 NOTE — ED Triage Notes (Signed)
Pt c/o head & chest congestion x1 week, cough, pt states has been taking Mucinex and has been helping some

## 2022-05-03 NOTE — ED Provider Notes (Signed)
[facility]    CSN: [id number] Arrival date & time: 05/03/22  1413      History   Chief Complaint Chief Complaint  Patient presents with   Chest Congestion    Cough    HPI Juan Chavez is a 71 y.o. male.   HPI  History provided by patient and his wife  Juan Chavez presents for ongoing cough, chest and nasal congestion, rhinorrhea with intermittent headache for the past week.  He has been taking Mucinex and some over-the-counter medications but the cough has not resolved.  He feels like it is settled into his chest.  Denies any chest pain but has some intermittent shortness of breath.  He has not documented fever but has felt hot.  There has been no nausea, vomiting, diarrhea, abdominal pain, sore throat, ear pain and neck pain.  His cough is keeping him up at night.  Wife notes that at times he is coughing back to back to back.  Says his Flonase is not helping him.      Past Medical History:  Diagnosis Date   Allergic rhinitis, cause unspecified    Allergy    Chicken pox    childhood   Essential hypertension, benign    Hematuria, unspecified    History of kidney stones    Impotence of organic origin    Measles    childhood   Mumps    childhood   Nonspecific abnormal electrocardiogram (ECG) (EKG)    Obstructive sleep apnea (adult) (pediatric)    CPAP   Other abnormal glucose    Other B-complex deficiencies    Personal history of malignant neoplasm of prostate    Personal history of urinary calculi    Prostate cancer ([place]) 2005   Prostatectomy   Pure hypercholesterolemia    Unspecified urinary incontinence    Unspecified vitamin D deficiency     Patient Active Problem List   Diagnosis Date Noted   Family history of cardiovascular disease 01/21/2017   Caregiver stress 11/17/2016   ASNHL (asymmetrical sensorineural hearing loss) 12/17/2015   Obesity (BMI 30.0-34.9) 07/03/2014   H/O malignant neoplasm of prostate 12/30/2012   Cystitis, radiation  12/30/2012   Other abnormal glucose 11/14/2012   Unspecified vitamin D deficiency 11/14/2012   Allergic rhinitis 11/14/2012   Essential hypertension, benign 08/02/2012   Genuine stress incontinence, male 07/26/2012    Past Surgical History:  Procedure Laterality Date   BIOPSY  03/31/2022   Procedure: BIOPSY;  Surgeon: Arta Silence, MD;  Location: [facility];  Service: Gastroenterology;;   COLONOSCOPY  01/22/2013   normal.  [facility].  Repeat 5 years.   COLONOSCOPY WITH PROPOFOL Bilateral 03/31/2022   Procedure: COLONOSCOPY WITH PROPOFOL;  Surgeon: Arta Silence, MD;  Location: [facility];  Service: Gastroenterology;  Laterality: Bilateral;   ELBOW SURGERY     ESOPHAGOGASTRODUODENOSCOPY (EGD) WITH PROPOFOL Bilateral 03/31/2022   Procedure: ESOPHAGOGASTRODUODENOSCOPY (EGD) WITH PROPOFOL;  Surgeon: Arta Silence, MD;  Location: [facility];  Service: Gastroenterology;  Laterality: Bilateral;   OLECRANON BURSECTOMY Left 06/02/2018   Procedure: OLECRANON BURSA;  Surgeon: Hessie Knows, MD;  Location: [facility];  Service: Orthopedics;  Laterality: Left;   PROSTATECTOMY  2005   urological surgery  07/2009       Home Medications    Prior to Admission medications   Medication Sig Start Date End Date Taking? Authorizing Provider  albuterol (VENTOLIN HFA) 108 (90 Base) MCG/ACT inhaler Inhale 2 puffs into the lungs every 4 (four) hours as needed for wheezing  or shortness of breath. 05/03/22  Yes Rivaan Kendall, DO  ALPRAZolam (XANAX) 0.5 MG tablet TAKE 1 TABLET BY MOUTH EVERY DAY AS NEEDED FOR ANXIETY Patient taking differently: Take 0.5 mg by mouth daily as needed for anxiety or sleep. 10/29/17  Yes [name], MD  amiodarone (PACERONE) 200 MG tablet Take 200 mg by mouth daily. 12/02/19  Yes [provider]  amoxicillin (AMOXIL) 875 MG tablet Take 1 tablet (875 mg total) by mouth 2 (two) times daily for 5 days. 05/03/22 05/08/22 Yes Avantika Shere, DO  atorvastatin (LIPITOR) 40 MG  tablet Take 40 mg by mouth every morning. 11/22/19 05/03/22 Yes [provider]  carvedilol (COREG) 3.125 MG tablet Take 3.125 mg by mouth 2 (two) times daily. 03/18/22  Yes [provider]  cholecalciferol (VITAMIN D) 1000 UNITS tablet Take 1,000 Units by mouth daily.   Yes [provider]  clotrimazole-betamethasone (LOTRISONE) cream Apply 1 Application topically daily. 02/19/22  Yes [provider]  doxazosin (CARDURA) 1 MG tablet Take 1 tablet by mouth at bedtime. 12/22/21  Yes [provider]  doxycycline (VIBRAMYCIN) 100 MG capsule Take 1 capsule (100 mg total) by mouth 2 (two) times daily for 5 days. 05/03/22 05/08/22 Yes Dynasty Holquin, DO  ELIQUIS 5 MG TABS tablet Take 1 tablet (5 mg total) by mouth 2 (two) times daily. 04/01/22  Yes Arta Silence, MD  fluticasone (FLONASE) 50 MCG/ACT nasal spray PLACE 2 SPRAYS INTO BOTH NOSTRILS DAILY. Patient taking differently: Place 2 sprays into both nostrils at bedtime as needed for allergies. 10/29/17  Yes [name], MD  hydrALAZINE (APRESOLINE) 100 MG tablet Take 100 mg by mouth 2 (two) times daily. 12/22/21  Yes [provider]  JARDIANCE 10 MG TABS tablet Take 10 mg by mouth daily. 02/07/22  Yes [provider]  loratadine (CLARITIN) 10 MG tablet TAKE 1 TABLET (10 MG TOTAL) BY MOUTH DAILY. Patient taking differently: Take 10 mg by mouth daily as needed for allergies. TAKE 1 TABLET (10 MG TOTAL) BY MOUTH DAILY. 10/29/17  Yes [name], MD  oxybutynin (DITROPAN-XL) 5 MG 24 hr tablet Take 1 tablet (5 mg total) by mouth at bedtime. 02/02/18  Yes [name], MD  pantoprazole (PROTONIX) 40 MG tablet Take 40 mg by mouth daily. 03/20/22  Yes [provider]  sacubitril-valsartan (ENTRESTO) 49-51 MG Take 1 tablet by mouth 2 (two) times daily. 11/21/19  Yes [provider]  spironolactone (ALDACTONE) 25 MG tablet Take 25 mg by mouth daily.   Yes [provider]   tadalafil (CIALIS) 20 MG tablet TAKE 1 TABLET (20 MG TOTAL) BY MOUTH DAILY AS NEEDED FOR ERECTILE DYSFUNCTION. 10/29/17  Yes [name], MD  vitamin B-12 (CYANOCOBALAMIN) 1000 MCG tablet Take 1,000 mcg by mouth daily.    Yes [provider]    Family History Family History  Problem Relation Age of Onset   Heart disease Father 58       AMI age 49; CM.   Hypertension Father    Mental illness Sister        home in [place]   Alcohol abuse Sister    Hypertension Mother    Heart disease Mother        AMI years ago; no CABG/stents.   Arthritis Mother    Dementia Mother    Heart disease Brother 86       AMI; CM    Social History Social History   Tobacco Use  Smoking status: Never   Smokeless tobacco: Never  Vaping Use   Vaping Use: Never used  Substance Use Topics   Alcohol use: Yes    Alcohol/week: 0.0 standard drinks of alcohol    Comment: occasional drinks beer 1 per week   Drug use: No     Allergies   Patient has no known allergies.   Review of Systems Review of Systems: negative unless otherwise stated in HPI.      Physical Exam Triage Vital Signs ED Triage Vitals  Enc Vitals Group     BP 05/03/22 1443 130/76     Pulse Rate 05/03/22 1443 61     Resp --      Temp 05/03/22 1443 98.2 F (36.8 C)     Temp Source 05/03/22 1443 Oral     SpO2 05/03/22 1443 99 %     Weight 05/03/22 1442 195 lb (88.5 kg)     Height 05/03/22 1442 '5\' 11"'$  (1.803 m)     Head Circumference --      Peak Flow --      Pain Score 05/03/22 1441 0     Pain Loc --      Pain Edu? --      Excl. in [place]? --    No data found.  Updated Vital Signs BP 130/76 (BP Location: Left Arm)   Pulse 61   Temp 98.2 F (36.8 C) (Oral)   Ht '5\' 11"'$  (1.803 m)   Wt 88.5 kg   SpO2 99%   BMI 27.20 kg/m   Visual Acuity Right Eye Distance:   Left Eye Distance:   Bilateral Distance:    Right Eye Near:   Left Eye Near:    Bilateral Near:     Physical Exam GEN:     alert, non-toxic  appearing male in no distress    HENT:  mucus membranes moist, oropharyngeal without lesions or exudate, no tonsillar hypertrophy,  mild oropharyngeal erythema ,  moderate erythematous hypertrophied turbinates, clear nasal discharge, bilateral TM normal, no maxillary or frontal sinus tenderness EYES:   pupils equal and reactive, EOMi, no scleral injection NECK:  normal ROM, no lymphadenopathy, no meningismus   RESP:  no increased work of breathing, clear to auscultation bilaterally, no wheezes, rales, rhonchi CVS:   regular rate and rhythm, no murmurs, no JVP MSK: No lower extremity edema or calf tenderness Skin:   warm and dry    UC Treatments / Results  Labs (all labs ordered are listed, but only abnormal results are displayed) Labs Reviewed  SARS CORONAVIRUS 2 BY RT PCR    EKG   Radiology DG Chest 2 View  Result Date: 05/03/2022 CLINICAL DATA:  Cough. EXAM: CHEST - 2 VIEW COMPARISON:  Chest x-ray dated 04/08/2015. FINDINGS: Stable mild cardiomegaly. Lungs are clear. No pleural effusion or pneumothorax is seen. Osseous structures about the chest are unremarkable. IMPRESSION: No active cardiopulmonary disease. No evidence of pneumonia or pulmonary edema. Stable mild cardiomegaly. Electronically Signed   By: Franki Cabot M.D.   On: 05/03/2022 16:03    Procedures Procedures (including critical care time)  Medications Ordered in UC Medications - No data to display  Initial Impression / Assessment and Plan / UC Course  I have reviewed the triage vital signs and the nursing notes.  Pertinent labs & imaging results that were available during my care of the patient were reviewed by me and considered in my medical decision making (see chart for details).  Pt is a 71 y.o. male who presents for 7 days of respiratory symptoms. Faiz is afebrile here without recent antipyretics. Satting well on room air. Overall pt is well appearing, well hydrated, without respiratory distress.  Pulmonary exam is unremarkable.  Chest x-ray obtained and did not show any focal pneumonia, pleural effusions or pneumothorax.  Radiology notes mild cardiomegaly that is stable from prior.  COVID testing obtained and was negative.  Given prolonged symptoms and productive cough will treat antibiotics for presumed bronchitis.  Given albuterol inhaler for shortness of breath.  Discussed return and ED precautions, understanding voiced.   Discussed MDM, treatment plan and plan for follow-up with patient/parent who agrees with plan.     Final Clinical Impressions(s) / UC Diagnoses   Final diagnoses:  Acute bronchitis, unspecified organism     Discharge Instructions      Your COVID test is negative.  Stop by the pharmacy to pick up your antibiotics.      ED Prescriptions     Medication Sig Dispense Auth. Provider   amoxicillin (AMOXIL) 875 MG tablet Take 1 tablet (875 mg total) by mouth 2 (two) times daily for 5 days. 10 tablet Brenetta Penny, DO   doxycycline (VIBRAMYCIN) 100 MG capsule Take 1 capsule (100 mg total) by mouth 2 (two) times daily for 5 days. 10 capsule Keary Hanak, DO   albuterol (VENTOLIN HFA) 108 (90 Base) MCG/ACT inhaler Inhale 2 puffs into the lungs every 4 (four) hours as needed for wheezing or shortness of breath. 6.7 g Lyndee Hensen, DO      PDMP not reviewed this encounter.   Lyndee Hensen, DO 05/03/22 2251
